# Patient Record
Sex: Female | Born: 1985 | Race: White | Hispanic: No | Marital: Single | Smoking: Current every day smoker
Health system: Southern US, Community
[De-identification: ages and names within clinical notes are randomized; demographics above are authoritative.]

## PROBLEM LIST (undated history)

## (undated) DIAGNOSIS — I82409 Acute embolism and thrombosis of unspecified deep veins of unspecified lower extremity: Secondary | ICD-10-CM

## (undated) DIAGNOSIS — E119 Type 2 diabetes mellitus without complications: Secondary | ICD-10-CM

## (undated) DIAGNOSIS — I2699 Other pulmonary embolism without acute cor pulmonale: Secondary | ICD-10-CM

## (undated) DIAGNOSIS — M359 Systemic involvement of connective tissue, unspecified: Secondary | ICD-10-CM

## (undated) HISTORY — PX: ABDOMINAL HYSTERECTOMY: SHX81

## (undated) HISTORY — PX: OTHER SURGICAL HISTORY: SHX169

---

## 2004-02-03 ENCOUNTER — Emergency Department: Payer: Self-pay | Admitting: Emergency Medicine

## 2004-04-12 ENCOUNTER — Ambulatory Visit: Payer: Self-pay | Admitting: Pain Medicine

## 2004-04-19 ENCOUNTER — Ambulatory Visit: Payer: Self-pay | Admitting: Pain Medicine

## 2004-05-03 ENCOUNTER — Ambulatory Visit: Payer: Self-pay | Admitting: Pain Medicine

## 2004-05-20 ENCOUNTER — Emergency Department: Payer: Self-pay | Admitting: Emergency Medicine

## 2004-06-10 ENCOUNTER — Emergency Department: Payer: Self-pay | Admitting: Unknown Physician Specialty

## 2004-06-12 ENCOUNTER — Emergency Department: Payer: Self-pay | Admitting: Internal Medicine

## 2004-07-03 ENCOUNTER — Emergency Department: Payer: Self-pay | Admitting: Emergency Medicine

## 2004-07-04 ENCOUNTER — Ambulatory Visit: Payer: Self-pay | Admitting: Emergency Medicine

## 2004-08-17 ENCOUNTER — Inpatient Hospital Stay: Payer: Self-pay | Admitting: Obstetrics and Gynecology

## 2004-09-08 ENCOUNTER — Emergency Department: Payer: Self-pay | Admitting: Unknown Physician Specialty

## 2004-12-07 ENCOUNTER — Observation Stay: Payer: Self-pay

## 2005-08-08 ENCOUNTER — Emergency Department: Payer: Self-pay | Admitting: Emergency Medicine

## 2006-07-15 ENCOUNTER — Emergency Department: Payer: Self-pay | Admitting: Emergency Medicine

## 2006-09-23 ENCOUNTER — Emergency Department: Payer: Self-pay | Admitting: Emergency Medicine

## 2007-01-11 ENCOUNTER — Emergency Department: Payer: Self-pay | Admitting: Unknown Physician Specialty

## 2007-01-20 ENCOUNTER — Ambulatory Visit: Payer: Self-pay | Admitting: Internal Medicine

## 2007-12-08 ENCOUNTER — Emergency Department: Payer: Self-pay | Admitting: Emergency Medicine

## 2008-02-18 ENCOUNTER — Emergency Department: Payer: Self-pay | Admitting: Emergency Medicine

## 2008-09-12 ENCOUNTER — Emergency Department: Payer: Self-pay | Admitting: Emergency Medicine

## 2008-10-24 ENCOUNTER — Emergency Department: Payer: Self-pay | Admitting: Emergency Medicine

## 2008-12-13 ENCOUNTER — Ambulatory Visit: Payer: Self-pay | Admitting: Family Medicine

## 2008-12-16 ENCOUNTER — Observation Stay: Payer: Self-pay | Admitting: Surgery

## 2009-01-05 ENCOUNTER — Ambulatory Visit: Payer: Self-pay | Admitting: Gastroenterology

## 2009-03-27 ENCOUNTER — Emergency Department: Payer: Self-pay | Admitting: Emergency Medicine

## 2009-03-30 ENCOUNTER — Ambulatory Visit: Payer: Self-pay | Admitting: Internal Medicine

## 2009-09-20 ENCOUNTER — Emergency Department: Payer: Self-pay | Admitting: Emergency Medicine

## 2009-09-20 ENCOUNTER — Ambulatory Visit: Payer: Self-pay | Admitting: Family Medicine

## 2009-12-30 ENCOUNTER — Emergency Department: Payer: Self-pay | Admitting: Emergency Medicine

## 2010-01-26 ENCOUNTER — Emergency Department: Payer: Self-pay | Admitting: Emergency Medicine

## 2010-02-21 ENCOUNTER — Ambulatory Visit: Payer: Self-pay | Admitting: Obstetrics and Gynecology

## 2010-04-23 ENCOUNTER — Emergency Department: Payer: Self-pay | Admitting: Emergency Medicine

## 2010-05-17 ENCOUNTER — Emergency Department: Payer: Self-pay | Admitting: Emergency Medicine

## 2010-05-18 ENCOUNTER — Ambulatory Visit: Payer: Self-pay | Admitting: Internal Medicine

## 2010-07-30 ENCOUNTER — Emergency Department: Payer: Self-pay | Admitting: Emergency Medicine

## 2010-09-24 ENCOUNTER — Emergency Department: Payer: Self-pay | Admitting: Emergency Medicine

## 2010-12-29 ENCOUNTER — Emergency Department: Payer: Self-pay | Admitting: Unknown Physician Specialty

## 2011-04-13 ENCOUNTER — Emergency Department: Payer: Self-pay | Admitting: Emergency Medicine

## 2011-04-13 LAB — COMPREHENSIVE METABOLIC PANEL
Albumin: 3.8 g/dL (ref 3.4–5.0)
Alkaline Phosphatase: 62 U/L (ref 50–136)
Anion Gap: 9 (ref 7–16)
BUN: 9 mg/dL (ref 7–18)
Bilirubin,Total: 0.2 mg/dL (ref 0.2–1.0)
Calcium, Total: 8.9 mg/dL (ref 8.5–10.1)
Creatinine: 0.75 mg/dL (ref 0.60–1.30)
Glucose: 78 mg/dL (ref 65–99)
Osmolality: 281 (ref 275–301)
Potassium: 3.7 mmol/L (ref 3.5–5.1)
SGPT (ALT): 31 U/L
Sodium: 142 mmol/L (ref 136–145)
Total Protein: 7.6 g/dL (ref 6.4–8.2)

## 2011-04-13 LAB — CBC
MCV: 90 fL (ref 80–100)
Platelet: 214 10*3/uL (ref 150–440)
RBC: 4.87 10*6/uL (ref 3.80–5.20)
WBC: 10.9 10*3/uL (ref 3.6–11.0)

## 2011-04-13 LAB — URINALYSIS, COMPLETE
Bacteria: NONE SEEN
Bilirubin,UR: NEGATIVE
Ketone: NEGATIVE
Leukocyte Esterase: NEGATIVE
Ph: 7 (ref 4.5–8.0)
Protein: NEGATIVE
WBC UR: NONE SEEN /HPF (ref 0–5)

## 2011-04-13 LAB — LIPASE, BLOOD: Lipase: 122 U/L (ref 73–393)

## 2011-06-14 ENCOUNTER — Emergency Department: Payer: Self-pay | Admitting: Emergency Medicine

## 2011-06-15 ENCOUNTER — Emergency Department: Payer: Self-pay | Admitting: Emergency Medicine

## 2011-06-15 LAB — CBC
HGB: 14.5 g/dL (ref 12.0–16.0)
MCH: 30.2 pg (ref 26.0–34.0)
MCV: 90 fL (ref 80–100)
Platelet: 220 10*3/uL (ref 150–440)
RBC: 4.8 10*6/uL (ref 3.80–5.20)

## 2011-06-15 LAB — URINALYSIS, COMPLETE
Bacteria: NONE SEEN
Bilirubin,UR: NEGATIVE
Glucose,UR: NEGATIVE mg/dL (ref 0–75)
Ketone: NEGATIVE
Nitrite: NEGATIVE
Ph: 6 (ref 4.5–8.0)
Squamous Epithelial: 4

## 2011-06-15 LAB — BASIC METABOLIC PANEL
Chloride: 106 mmol/L (ref 98–107)
Co2: 27 mmol/L (ref 21–32)
Creatinine: 0.75 mg/dL (ref 0.60–1.30)
EGFR (African American): >60
Osmolality: 278 (ref 275–301)
Potassium: 3.8 mmol/L (ref 3.5–5.1)
Sodium: 140 mmol/L (ref 136–145)

## 2011-06-17 LAB — URINE CULTURE

## 2011-08-02 ENCOUNTER — Emergency Department: Payer: Self-pay | Admitting: Emergency Medicine

## 2011-08-02 LAB — BASIC METABOLIC PANEL
Chloride: 110 mmol/L — ABNORMAL HIGH (ref 98–107)
Co2: 26 mmol/L (ref 21–32)
Creatinine: 0.64 mg/dL (ref 0.60–1.30)
Glucose: 85 mg/dL (ref 65–99)
Potassium: 3.8 mmol/L (ref 3.5–5.1)

## 2011-08-02 LAB — URINALYSIS, COMPLETE
Glucose,UR: NEGATIVE mg/dL (ref 0–75)
Nitrite: NEGATIVE
Ph: 6 (ref 4.5–8.0)
Protein: NEGATIVE
Squamous Epithelial: 8
WBC UR: 4 /HPF (ref 0–5)

## 2011-08-02 LAB — CBC
MCH: 30.7 pg (ref 26.0–34.0)
MCV: 90 fL (ref 80–100)
Platelet: 229 10*3/uL (ref 150–440)
RBC: 4.61 10*6/uL (ref 3.80–5.20)

## 2011-08-24 ENCOUNTER — Ambulatory Visit: Payer: Self-pay | Admitting: Family Medicine

## 2011-08-28 ENCOUNTER — Emergency Department: Payer: Self-pay | Admitting: Unknown Physician Specialty

## 2011-08-28 LAB — URINALYSIS, COMPLETE
Glucose,UR: NEGATIVE mg/dL (ref 0–75)
Ketone: NEGATIVE
Nitrite: NEGATIVE
Ph: 6 (ref 4.5–8.0)
Protein: NEGATIVE
RBC,UR: 6 /HPF (ref 0–5)

## 2011-08-28 LAB — CBC
HGB: 13.6 g/dL (ref 12.0–16.0)
MCHC: 33.6 g/dL (ref 32.0–36.0)
MCV: 90 fL (ref 80–100)
Platelet: 215 10*3/uL (ref 150–440)
RBC: 4.52 10*6/uL (ref 3.80–5.20)
RDW: 13.2 % (ref 11.5–14.5)
WBC: 12.6 10*3/uL — ABNORMAL HIGH (ref 3.6–11.0)

## 2011-08-28 LAB — COMPREHENSIVE METABOLIC PANEL
Albumin: 3.2 g/dL — ABNORMAL LOW (ref 3.4–5.0)
Alkaline Phosphatase: 76 U/L (ref 50–136)
Anion Gap: 8 (ref 7–16)
BUN: 11 mg/dL (ref 7–18)
Bilirubin,Total: 0.2 mg/dL (ref 0.2–1.0)
Calcium, Total: 8.7 mg/dL (ref 8.5–10.1)
Co2: 26 mmol/L (ref 21–32)
EGFR (African American): >60
EGFR (Non-African Amer.): >60
Glucose: 77 mg/dL (ref 65–99)
SGOT(AST): 17 U/L (ref 15–37)
Sodium: 138 mmol/L (ref 136–145)

## 2011-08-28 LAB — LIPASE, BLOOD: Lipase: 142 U/L (ref 73–393)

## 2011-10-08 ENCOUNTER — Emergency Department: Payer: Self-pay | Admitting: Emergency Medicine

## 2011-10-08 LAB — COMPREHENSIVE METABOLIC PANEL
Albumin: 3.3 g/dL — ABNORMAL LOW (ref 3.4–5.0)
Anion Gap: 6 — ABNORMAL LOW (ref 7–16)
BUN: 6 mg/dL — ABNORMAL LOW (ref 7–18)
Calcium, Total: 8.5 mg/dL (ref 8.5–10.1)
Chloride: 106 mmol/L (ref 98–107)
Co2: 26 mmol/L (ref 21–32)
EGFR (African American): >60
Glucose: 91 mg/dL (ref 65–99)
Osmolality: 273 (ref 275–301)
Potassium: 4 mmol/L (ref 3.5–5.1)
SGOT(AST): 83 U/L — ABNORMAL HIGH (ref 15–37)
SGPT (ALT): 199 U/L — ABNORMAL HIGH

## 2011-10-08 LAB — CBC
HGB: 14.1 g/dL (ref 12.0–16.0)
MCH: 29.9 pg (ref 26.0–34.0)
MCHC: 32.8 g/dL (ref 32.0–36.0)
MCV: 91 fL (ref 80–100)
Platelet: 230 10*3/uL (ref 150–440)
RBC: 4.73 10*6/uL (ref 3.80–5.20)

## 2011-10-08 LAB — URINALYSIS, COMPLETE
Ketone: NEGATIVE
Nitrite: NEGATIVE
Ph: 6 (ref 4.5–8.0)
Protein: NEGATIVE
Specific Gravity: 1.015 (ref 1.003–1.030)
Squamous Epithelial: 4
WBC UR: 5 /HPF (ref 0–5)

## 2011-10-24 ENCOUNTER — Emergency Department: Payer: Self-pay | Admitting: *Deleted

## 2011-10-24 LAB — URINALYSIS, COMPLETE
Bilirubin,UR: NEGATIVE
Glucose,UR: NEGATIVE mg/dL (ref 0–75)
Ph: 6 (ref 4.5–8.0)
Protein: 100
RBC,UR: 646 /HPF (ref 0–5)
Specific Gravity: 1.031 (ref 1.003–1.030)
Squamous Epithelial: 2
WBC UR: 32 /HPF (ref 0–5)

## 2011-10-24 LAB — PREGNANCY, URINE: Pregnancy Test, Urine: NEGATIVE m[IU]/mL

## 2012-03-01 ENCOUNTER — Emergency Department: Payer: Self-pay | Admitting: Emergency Medicine

## 2012-03-16 ENCOUNTER — Emergency Department: Payer: Self-pay | Admitting: Emergency Medicine

## 2012-05-12 ENCOUNTER — Emergency Department: Payer: Self-pay | Admitting: Emergency Medicine

## 2012-06-21 ENCOUNTER — Emergency Department: Payer: Self-pay | Admitting: Unknown Physician Specialty

## 2012-06-21 LAB — COMPREHENSIVE METABOLIC PANEL
Albumin: 3.5 g/dL (ref 3.4–5.0)
Alkaline Phosphatase: 67 U/L (ref 50–136)
Anion Gap: 4 — ABNORMAL LOW (ref 7–16)
Chloride: 109 mmol/L — ABNORMAL HIGH (ref 98–107)
Co2: 28 mmol/L (ref 21–32)
EGFR (African American): >60
EGFR (Non-African Amer.): >60
Glucose: 93 mg/dL (ref 65–99)
Osmolality: 280 (ref 275–301)
Sodium: 141 mmol/L (ref 136–145)
Total Protein: 7.3 g/dL (ref 6.4–8.2)

## 2012-06-21 LAB — CBC
HGB: 14.7 g/dL (ref 12.0–16.0)
MCV: 90 fL (ref 80–100)
Platelet: 221 10*3/uL (ref 150–440)
RBC: 4.91 10*6/uL (ref 3.80–5.20)

## 2012-06-21 LAB — PROTIME-INR
INR: 1
Prothrombin Time: 13.1 secs (ref 11.5–14.7)

## 2012-06-21 LAB — APTT: Activated PTT: 36.1 secs — ABNORMAL HIGH (ref 23.6–35.9)

## 2012-10-04 ENCOUNTER — Emergency Department: Payer: Self-pay | Admitting: Emergency Medicine

## 2012-10-04 LAB — URINALYSIS, COMPLETE
Bilirubin,UR: NEGATIVE
RBC,UR: 406 /HPF (ref 0–5)
Squamous Epithelial: 18

## 2012-11-30 ENCOUNTER — Emergency Department: Payer: Self-pay | Admitting: Emergency Medicine

## 2012-11-30 LAB — URINALYSIS, COMPLETE
Blood: NEGATIVE
Glucose,UR: NEGATIVE mg/dL (ref 0–75)
Ketone: NEGATIVE
RBC,UR: 3 /HPF (ref 0–5)

## 2012-11-30 LAB — CBC
MCH: 30.3 pg (ref 26.0–34.0)
Platelet: 205 10*3/uL (ref 150–440)
RDW: 13.2 % (ref 11.5–14.5)

## 2012-11-30 LAB — COMPREHENSIVE METABOLIC PANEL
Albumin: 3.4 g/dL (ref 3.4–5.0)
Alkaline Phosphatase: 73 U/L (ref 50–136)
BUN: 8 mg/dL (ref 7–18)
Bilirubin,Total: 0.3 mg/dL (ref 0.2–1.0)
Chloride: 109 mmol/L — ABNORMAL HIGH (ref 98–107)
Creatinine: 0.75 mg/dL (ref 0.60–1.30)
EGFR (Non-African Amer.): >60
Glucose: 131 mg/dL — ABNORMAL HIGH (ref 65–99)
Osmolality: 276 (ref 275–301)
SGOT(AST): 21 U/L (ref 15–37)
SGPT (ALT): 28 U/L (ref 12–78)
Sodium: 138 mmol/L (ref 136–145)
Total Protein: 7.1 g/dL (ref 6.4–8.2)

## 2012-12-31 ENCOUNTER — Emergency Department: Payer: Self-pay | Admitting: Emergency Medicine

## 2012-12-31 LAB — URINALYSIS, COMPLETE
Bilirubin,UR: NEGATIVE
Glucose,UR: 150 mg/dL (ref 0–75)
Leukocyte Esterase: NEGATIVE
Ph: 6 (ref 4.5–8.0)
Protein: NEGATIVE
Specific Gravity: 1.024 (ref 1.003–1.030)
Squamous Epithelial: 3

## 2012-12-31 LAB — CBC
MCH: 31.2 pg (ref 26.0–34.0)
MCHC: 34.5 g/dL (ref 32.0–36.0)
Platelet: 239 10*3/uL (ref 150–440)
WBC: 8.7 10*3/uL (ref 3.6–11.0)

## 2012-12-31 LAB — COMPREHENSIVE METABOLIC PANEL
Albumin: 3.3 g/dL — ABNORMAL LOW (ref 3.4–5.0)
Alkaline Phosphatase: 81 U/L (ref 50–136)
Bilirubin,Total: 0.2 mg/dL (ref 0.2–1.0)
Calcium, Total: 8.3 mg/dL — ABNORMAL LOW (ref 8.5–10.1)
Chloride: 107 mmol/L (ref 98–107)
EGFR (Non-African Amer.): >60
Glucose: 198 mg/dL — ABNORMAL HIGH (ref 65–99)
Potassium: 3.8 mmol/L (ref 3.5–5.1)
SGOT(AST): 22 U/L (ref 15–37)
SGPT (ALT): 30 U/L (ref 12–78)

## 2013-01-01 LAB — GC/CHLAMYDIA PROBE AMP

## 2013-01-01 LAB — WET PREP, GENITAL

## 2013-01-22 ENCOUNTER — Emergency Department: Payer: Self-pay | Admitting: Emergency Medicine

## 2013-01-22 LAB — URINALYSIS, COMPLETE
Bilirubin,UR: NEGATIVE
Ketone: NEGATIVE
Leukocyte Esterase: NEGATIVE
Ph: 6 (ref 4.5–8.0)
Protein: NEGATIVE
RBC,UR: 166 /HPF (ref 0–5)
Specific Gravity: 1.022 (ref 1.003–1.030)
WBC UR: 2 /HPF (ref 0–5)

## 2013-01-22 LAB — COMPREHENSIVE METABOLIC PANEL
Albumin: 3.3 g/dL — ABNORMAL LOW (ref 3.4–5.0)
BUN: 10 mg/dL (ref 7–18)
Calcium, Total: 8.2 mg/dL — ABNORMAL LOW (ref 8.5–10.1)
Co2: 25 mmol/L (ref 21–32)
Creatinine: 0.83 mg/dL (ref 0.60–1.30)
EGFR (African American): >60
EGFR (Non-African Amer.): >60
Glucose: 217 mg/dL — ABNORMAL HIGH (ref 65–99)
Osmolality: 285 (ref 275–301)
Sodium: 140 mmol/L (ref 136–145)

## 2013-01-22 LAB — CBC
HCT: 40.1 % (ref 35.0–47.0)
MCHC: 34.5 g/dL (ref 32.0–36.0)
Platelet: 211 10*3/uL (ref 150–440)
RDW: 13.1 % (ref 11.5–14.5)

## 2013-03-29 DIAGNOSIS — Z8672 Personal history of thrombophlebitis: Secondary | ICD-10-CM | POA: Insufficient documentation

## 2013-04-13 ENCOUNTER — Other Ambulatory Visit: Payer: Self-pay | Admitting: Obstetrics and Gynecology

## 2013-04-13 LAB — APTT: Activated PTT: 32.2 secs (ref 23.6–35.9)

## 2013-04-13 LAB — CBC WITH DIFFERENTIAL/PLATELET
BASOS PCT: 1.2 %
Basophil #: 0.1 10*3/uL (ref 0.0–0.1)
EOS ABS: 0.1 10*3/uL (ref 0.0–0.7)
EOS PCT: 0.8 %
HCT: 42.5 % (ref 35.0–47.0)
HGB: 13.9 g/dL (ref 12.0–16.0)
LYMPHS ABS: 2.5 10*3/uL (ref 1.0–3.6)
Lymphocyte %: 26 %
MCH: 29.3 pg (ref 26.0–34.0)
MCHC: 32.7 g/dL (ref 32.0–36.0)
MCV: 90 fL (ref 80–100)
MONO ABS: 0.3 x10 3/mm (ref 0.2–0.9)
Monocyte %: 3 %
NEUTROS PCT: 69 %
Neutrophil #: 6.7 10*3/uL — ABNORMAL HIGH (ref 1.4–6.5)
PLATELETS: 242 10*3/uL (ref 150–440)
RBC: 4.74 10*6/uL (ref 3.80–5.20)
RDW: 13.8 % (ref 11.5–14.5)
WBC: 9.7 10*3/uL (ref 3.6–11.0)

## 2013-04-13 LAB — TSH: Thyroid Stimulating Horm: 0.475 u[IU]/mL

## 2013-04-13 LAB — PROTIME-INR
INR: 1
PROTHROMBIN TIME: 13.2 s (ref 11.5–14.7)

## 2013-05-11 ENCOUNTER — Emergency Department: Payer: Self-pay | Admitting: Emergency Medicine

## 2013-05-13 ENCOUNTER — Ambulatory Visit: Payer: Self-pay | Admitting: Oncology

## 2013-05-16 ENCOUNTER — Emergency Department: Payer: Self-pay | Admitting: Emergency Medicine

## 2013-05-17 ENCOUNTER — Ambulatory Visit: Payer: Self-pay | Admitting: Oncology

## 2013-05-20 ENCOUNTER — Ambulatory Visit: Payer: Self-pay | Admitting: Oncology

## 2013-06-17 ENCOUNTER — Ambulatory Visit: Payer: Self-pay | Admitting: Oncology

## 2013-07-02 ENCOUNTER — Ambulatory Visit: Payer: Self-pay | Admitting: Obstetrics and Gynecology

## 2013-07-02 LAB — CBC
HCT: 40.1 % (ref 35.0–47.0)
HGB: 13 g/dL (ref 12.0–16.0)
MCH: 29 pg (ref 26.0–34.0)
MCHC: 32.5 g/dL (ref 32.0–36.0)
MCV: 89 fL (ref 80–100)
PLATELETS: 215 10*3/uL (ref 150–440)
RBC: 4.5 10*6/uL (ref 3.80–5.20)
RDW: 13.8 % (ref 11.5–14.5)
WBC: 6.6 10*3/uL (ref 3.6–11.0)

## 2013-07-02 LAB — BASIC METABOLIC PANEL
Anion Gap: 6 — ABNORMAL LOW (ref 7–16)
BUN: 8 mg/dL (ref 7–18)
Calcium, Total: 8.2 mg/dL — ABNORMAL LOW (ref 8.5–10.1)
Chloride: 106 mmol/L (ref 98–107)
Co2: 25 mmol/L (ref 21–32)
Creatinine: 0.78 mg/dL (ref 0.60–1.30)
EGFR (African American): >60
EGFR (Non-African Amer.): >60
GLUCOSE: 219 mg/dL — AB (ref 65–99)
Osmolality: 279 (ref 275–301)
POTASSIUM: 3.4 mmol/L — AB (ref 3.5–5.1)
SODIUM: 137 mmol/L (ref 136–145)

## 2013-07-14 ENCOUNTER — Ambulatory Visit: Payer: Self-pay | Admitting: Obstetrics and Gynecology

## 2013-07-15 LAB — BASIC METABOLIC PANEL
Anion Gap: 9 (ref 7–16)
BUN: 8 mg/dL (ref 7–18)
Calcium, Total: 8.4 mg/dL — ABNORMAL LOW (ref 8.5–10.1)
Chloride: 107 mmol/L (ref 98–107)
Co2: 24 mmol/L (ref 21–32)
Creatinine: 0.71 mg/dL (ref 0.60–1.30)
EGFR (African American): >60
Glucose: 182 mg/dL — ABNORMAL HIGH (ref 65–99)
OSMOLALITY: 282 (ref 275–301)
Potassium: 4.2 mmol/L (ref 3.5–5.1)
Sodium: 140 mmol/L (ref 136–145)

## 2013-07-15 LAB — CBC WITH DIFFERENTIAL/PLATELET
Basophil #: 0.1 10*3/uL (ref 0.0–0.1)
Basophil %: 1.1 %
EOS PCT: 0 %
Eosinophil #: 0 10*3/uL (ref 0.0–0.7)
HCT: 38.6 % (ref 35.0–47.0)
HGB: 12.4 g/dL (ref 12.0–16.0)
Lymphocyte #: 0.9 10*3/uL — ABNORMAL LOW (ref 1.0–3.6)
Lymphocyte %: 6.8 %
MCH: 28.7 pg (ref 26.0–34.0)
MCHC: 32.2 g/dL (ref 32.0–36.0)
MCV: 89 fL (ref 80–100)
MONO ABS: 0.4 x10 3/mm (ref 0.2–0.9)
Monocyte %: 3 %
NEUTROS ABS: 11.8 10*3/uL — AB (ref 1.4–6.5)
Neutrophil %: 89.1 %
PLATELETS: 213 10*3/uL (ref 150–440)
RBC: 4.33 10*6/uL (ref 3.80–5.20)
RDW: 13.5 % (ref 11.5–14.5)
WBC: 13.3 10*3/uL — AB (ref 3.6–11.0)

## 2013-07-17 ENCOUNTER — Ambulatory Visit: Payer: Self-pay | Admitting: Internal Medicine

## 2013-07-17 LAB — PATHOLOGY REPORT

## 2013-07-18 ENCOUNTER — Emergency Department: Payer: Self-pay | Admitting: Internal Medicine

## 2013-07-18 LAB — COMPREHENSIVE METABOLIC PANEL
Albumin: 3.4 g/dL (ref 3.4–5.0)
Alkaline Phosphatase: 61 U/L
Anion Gap: 9 (ref 7–16)
BUN: 7 mg/dL (ref 7–18)
Bilirubin,Total: 0.4 mg/dL (ref 0.2–1.0)
CO2: 23 mmol/L (ref 21–32)
CREATININE: 0.74 mg/dL (ref 0.60–1.30)
Calcium, Total: 8.9 mg/dL (ref 8.5–10.1)
Chloride: 105 mmol/L (ref 98–107)
EGFR (African American): >60
EGFR (Non-African Amer.): >60
Glucose: 177 mg/dL — ABNORMAL HIGH (ref 65–99)
OSMOLALITY: 276 (ref 275–301)
Potassium: 3.9 mmol/L (ref 3.5–5.1)
SGOT(AST): 23 U/L (ref 15–37)
SGPT (ALT): 19 U/L (ref 12–78)
SODIUM: 137 mmol/L (ref 136–145)
TOTAL PROTEIN: 7.4 g/dL (ref 6.4–8.2)

## 2013-07-18 LAB — CBC WITH DIFFERENTIAL/PLATELET
Basophil #: 0.2 10*3/uL — ABNORMAL HIGH (ref 0.0–0.1)
Basophil %: 2.1 %
EOS ABS: 0.1 10*3/uL (ref 0.0–0.7)
Eosinophil %: 1 %
HCT: 40.7 % (ref 35.0–47.0)
HGB: 13.8 g/dL (ref 12.0–16.0)
Lymphocyte #: 2.1 10*3/uL (ref 1.0–3.6)
Lymphocyte %: 20.1 %
MCH: 29.9 pg (ref 26.0–34.0)
MCHC: 33.8 g/dL (ref 32.0–36.0)
MCV: 89 fL (ref 80–100)
MONO ABS: 0.3 x10 3/mm (ref 0.2–0.9)
Monocyte %: 2.6 %
NEUTROS ABS: 7.8 10*3/uL — AB (ref 1.4–6.5)
Neutrophil %: 74.2 %
PLATELETS: 254 10*3/uL (ref 150–440)
RBC: 4.6 10*6/uL (ref 3.80–5.20)
RDW: 13.7 % (ref 11.5–14.5)
WBC: 10.4 10*3/uL (ref 3.6–11.0)

## 2013-07-18 LAB — URINALYSIS, COMPLETE
Bacteria: NONE SEEN
Bilirubin,UR: NEGATIVE
Glucose,UR: NEGATIVE mg/dL (ref 0–75)
Ketone: NEGATIVE
NITRITE: NEGATIVE
Ph: 8 (ref 4.5–8.0)
Protein: NEGATIVE
Specific Gravity: 1.01 (ref 1.003–1.030)
Squamous Epithelial: 18
WBC UR: 6 /HPF (ref 0–5)

## 2013-07-18 LAB — LIPASE, BLOOD: Lipase: 89 U/L (ref 73–393)

## 2013-08-08 ENCOUNTER — Inpatient Hospital Stay: Payer: Self-pay | Admitting: Family Medicine

## 2013-08-08 LAB — COMPREHENSIVE METABOLIC PANEL
ALK PHOS: 62 U/L
ALT: 20 U/L (ref 12–78)
Albumin: 3.5 g/dL (ref 3.4–5.0)
Anion Gap: 7 (ref 7–16)
BILIRUBIN TOTAL: 0.2 mg/dL (ref 0.2–1.0)
BUN: 9 mg/dL (ref 7–18)
Calcium, Total: 8.7 mg/dL (ref 8.5–10.1)
Chloride: 106 mmol/L (ref 98–107)
Co2: 25 mmol/L (ref 21–32)
Creatinine: 0.73 mg/dL (ref 0.60–1.30)
EGFR (Non-African Amer.): >60
Glucose: 124 mg/dL — ABNORMAL HIGH (ref 65–99)
Osmolality: 276 (ref 275–301)
Potassium: 3.4 mmol/L — ABNORMAL LOW (ref 3.5–5.1)
SGOT(AST): 18 U/L (ref 15–37)
SODIUM: 138 mmol/L (ref 136–145)
Total Protein: 7.3 g/dL (ref 6.4–8.2)

## 2013-08-08 LAB — PRO B NATRIURETIC PEPTIDE: B-Type Natriuretic Peptide: 40 pg/mL (ref 0–125)

## 2013-08-08 LAB — CK TOTAL AND CKMB (NOT AT ARMC)
CK, Total: 77 U/L
CK-MB: <0.5 ng/mL — ABNORMAL LOW (ref 0.5–3.6)

## 2013-08-08 LAB — CBC
HCT: 39.5 % (ref 35.0–47.0)
HGB: 13.2 g/dL (ref 12.0–16.0)
MCH: 29 pg (ref 26.0–34.0)
MCHC: 33.3 g/dL (ref 32.0–36.0)
MCV: 87 fL (ref 80–100)
Platelet: 225 10*3/uL (ref 150–440)
RBC: 4.53 10*6/uL (ref 3.80–5.20)
RDW: 13.7 % (ref 11.5–14.5)
WBC: 9.7 10*3/uL (ref 3.6–11.0)

## 2013-08-08 LAB — PROTIME-INR
INR: 0.9
PROTHROMBIN TIME: 12.5 s (ref 11.5–14.7)

## 2013-08-08 LAB — TROPONIN I

## 2013-08-08 LAB — APTT: ACTIVATED PTT: 33.3 s (ref 23.6–35.9)

## 2013-08-09 LAB — APTT
ACTIVATED PTT: 66.5 s — AB (ref 23.6–35.9)
Activated PTT: 51.5 secs — ABNORMAL HIGH (ref 23.6–35.9)
Activated PTT: 50.2 secs — ABNORMAL HIGH (ref 23.6–35.9)

## 2013-08-10 LAB — HEMOGLOBIN: HGB: 12.7 g/dL (ref 12.0–16.0)

## 2013-08-10 LAB — APTT
ACTIVATED PTT: 61 s — AB (ref 23.6–35.9)
Activated PTT: 72.9 secs — ABNORMAL HIGH (ref 23.6–35.9)

## 2013-08-10 LAB — PLATELET COUNT: PLATELETS: 194 10*3/uL (ref 150–440)

## 2013-08-17 ENCOUNTER — Emergency Department: Payer: Self-pay | Admitting: Emergency Medicine

## 2013-08-17 ENCOUNTER — Ambulatory Visit: Payer: Self-pay | Admitting: Internal Medicine

## 2013-08-17 LAB — BASIC METABOLIC PANEL
Anion Gap: 10 (ref 7–16)
BUN: 8 mg/dL (ref 7–18)
CHLORIDE: 104 mmol/L (ref 98–107)
Calcium, Total: 8.4 mg/dL — ABNORMAL LOW (ref 8.5–10.1)
Co2: 23 mmol/L (ref 21–32)
Creatinine: 0.64 mg/dL (ref 0.60–1.30)
EGFR (African American): >60
EGFR (Non-African Amer.): >60
GLUCOSE: 156 mg/dL — AB (ref 65–99)
OSMOLALITY: 275 (ref 275–301)
Potassium: 3.7 mmol/L (ref 3.5–5.1)
SODIUM: 137 mmol/L (ref 136–145)

## 2013-08-17 LAB — CBC
HCT: 39.6 % (ref 35.0–47.0)
HGB: 13 g/dL (ref 12.0–16.0)
MCH: 28.7 pg (ref 26.0–34.0)
MCHC: 32.8 g/dL (ref 32.0–36.0)
MCV: 87 fL (ref 80–100)
PLATELETS: 206 10*3/uL (ref 150–440)
RBC: 4.52 10*6/uL (ref 3.80–5.20)
RDW: 14 % (ref 11.5–14.5)
WBC: 8.3 10*3/uL (ref 3.6–11.0)

## 2013-08-17 LAB — TROPONIN I

## 2013-08-19 ENCOUNTER — Ambulatory Visit: Payer: Self-pay | Admitting: Oncology

## 2013-08-26 ENCOUNTER — Ambulatory Visit: Payer: Self-pay | Admitting: Oncology

## 2013-09-05 ENCOUNTER — Emergency Department: Payer: Self-pay | Admitting: Emergency Medicine

## 2013-09-05 LAB — COMPREHENSIVE METABOLIC PANEL
ALK PHOS: 58 U/L
Albumin: 3.4 g/dL (ref 3.4–5.0)
Anion Gap: 5 — ABNORMAL LOW (ref 7–16)
BUN: 9 mg/dL (ref 7–18)
Bilirubin,Total: 0.2 mg/dL (ref 0.2–1.0)
CALCIUM: 8.9 mg/dL (ref 8.5–10.1)
CHLORIDE: 107 mmol/L (ref 98–107)
Co2: 25 mmol/L (ref 21–32)
Creatinine: 0.84 mg/dL (ref 0.60–1.30)
EGFR (African American): >60
EGFR (Non-African Amer.): >60
GLUCOSE: 183 mg/dL — AB (ref 65–99)
Osmolality: 277 (ref 275–301)
POTASSIUM: 3.9 mmol/L (ref 3.5–5.1)
SGOT(AST): 17 U/L (ref 15–37)
SGPT (ALT): 27 U/L (ref 12–78)
Sodium: 137 mmol/L (ref 136–145)
TOTAL PROTEIN: 7.6 g/dL (ref 6.4–8.2)

## 2013-09-05 LAB — CBC
HCT: 44.4 % (ref 35.0–47.0)
HGB: 14 g/dL (ref 12.0–16.0)
MCH: 28.1 pg (ref 26.0–34.0)
MCHC: 31.5 g/dL — ABNORMAL LOW (ref 32.0–36.0)
MCV: 89 fL (ref 80–100)
Platelet: 209 10*3/uL (ref 150–440)
RBC: 4.98 10*6/uL (ref 3.80–5.20)
RDW: 14 % (ref 11.5–14.5)
WBC: 7.9 10*3/uL (ref 3.6–11.0)

## 2013-09-05 LAB — PROTIME-INR
INR: 1
Prothrombin Time: 13.5 secs (ref 11.5–14.7)

## 2013-09-05 LAB — TROPONIN I: Troponin-I: <0.02 ng/mL

## 2013-09-05 LAB — APTT: ACTIVATED PTT: 35.2 s (ref 23.6–35.9)

## 2013-09-16 ENCOUNTER — Ambulatory Visit: Payer: Self-pay | Admitting: Internal Medicine

## 2013-09-16 ENCOUNTER — Ambulatory Visit: Payer: Self-pay | Admitting: Oncology

## 2013-09-20 ENCOUNTER — Emergency Department: Payer: Self-pay | Admitting: Emergency Medicine

## 2013-09-20 LAB — COMPREHENSIVE METABOLIC PANEL
ALBUMIN: 3.3 g/dL — AB (ref 3.4–5.0)
ALT: 26 U/L (ref 12–78)
AST: 18 U/L (ref 15–37)
Alkaline Phosphatase: 64 U/L
Anion Gap: 9 (ref 7–16)
BILIRUBIN TOTAL: 0.2 mg/dL (ref 0.2–1.0)
BUN: 9 mg/dL (ref 7–18)
CHLORIDE: 107 mmol/L (ref 98–107)
Calcium, Total: 8.4 mg/dL — ABNORMAL LOW (ref 8.5–10.1)
Co2: 24 mmol/L (ref 21–32)
Creatinine: 0.84 mg/dL (ref 0.60–1.30)
EGFR (Non-African Amer.): >60
GLUCOSE: 137 mg/dL — AB (ref 65–99)
Osmolality: 280 (ref 275–301)
Potassium: 3.4 mmol/L — ABNORMAL LOW (ref 3.5–5.1)
Sodium: 140 mmol/L (ref 136–145)
Total Protein: 7.5 g/dL (ref 6.4–8.2)

## 2013-09-20 LAB — CBC WITH DIFFERENTIAL/PLATELET
BASOS ABS: 0.1 10*3/uL (ref 0.0–0.1)
Basophil %: 1.2 %
EOS ABS: 0.1 10*3/uL (ref 0.0–0.7)
Eosinophil %: 1.4 %
HCT: 40.1 % (ref 35.0–47.0)
HGB: 12.9 g/dL (ref 12.0–16.0)
LYMPHS ABS: 3.5 10*3/uL (ref 1.0–3.6)
Lymphocyte %: 34.5 %
MCH: 28.2 pg (ref 26.0–34.0)
MCHC: 32.2 g/dL (ref 32.0–36.0)
MCV: 87 fL (ref 80–100)
MONO ABS: 0.4 x10 3/mm (ref 0.2–0.9)
MONOS PCT: 3.9 %
NEUTROS ABS: 5.9 10*3/uL (ref 1.4–6.5)
Neutrophil %: 59 %
PLATELETS: 261 10*3/uL (ref 150–440)
RBC: 4.59 10*6/uL (ref 3.80–5.20)
RDW: 14.5 % (ref 11.5–14.5)
WBC: 10.1 10*3/uL (ref 3.6–11.0)

## 2013-09-20 LAB — TROPONIN I

## 2013-11-03 ENCOUNTER — Emergency Department: Payer: Self-pay | Admitting: Emergency Medicine

## 2013-11-03 LAB — COMPREHENSIVE METABOLIC PANEL
ALBUMIN: 3.5 g/dL (ref 3.4–5.0)
ALK PHOS: 62 U/L
AST: 13 U/L — AB (ref 15–37)
Anion Gap: 7 (ref 7–16)
BUN: 10 mg/dL (ref 7–18)
Bilirubin,Total: 0.2 mg/dL (ref 0.2–1.0)
CHLORIDE: 108 mmol/L — AB (ref 98–107)
Calcium, Total: 8.2 mg/dL — ABNORMAL LOW (ref 8.5–10.1)
Co2: 24 mmol/L (ref 21–32)
Creatinine: 0.96 mg/dL (ref 0.60–1.30)
EGFR (African American): >60
Glucose: 144 mg/dL — ABNORMAL HIGH (ref 65–99)
Osmolality: 279 (ref 275–301)
POTASSIUM: 3.6 mmol/L (ref 3.5–5.1)
SGPT (ALT): 23 U/L
SODIUM: 139 mmol/L (ref 136–145)
Total Protein: 7.4 g/dL (ref 6.4–8.2)

## 2013-11-03 LAB — URINALYSIS, COMPLETE
BILIRUBIN, UR: NEGATIVE
Bacteria: NONE SEEN
GLUCOSE, UR: NEGATIVE mg/dL (ref 0–75)
Ketone: NEGATIVE
Leukocyte Esterase: NEGATIVE
NITRITE: NEGATIVE
PROTEIN: NEGATIVE
Ph: 6 (ref 4.5–8.0)
RBC,UR: 373 /HPF (ref 0–5)
SPECIFIC GRAVITY: 1.021 (ref 1.003–1.030)

## 2013-11-03 LAB — CBC
HCT: 41.7 % (ref 35.0–47.0)
HGB: 13.4 g/dL (ref 12.0–16.0)
MCH: 28.1 pg (ref 26.0–34.0)
MCHC: 32.1 g/dL (ref 32.0–36.0)
MCV: 88 fL (ref 80–100)
PLATELETS: 232 10*3/uL (ref 150–440)
RBC: 4.77 10*6/uL (ref 3.80–5.20)
RDW: 14.6 % — ABNORMAL HIGH (ref 11.5–14.5)
WBC: 8.6 10*3/uL (ref 3.6–11.0)

## 2013-12-01 DIAGNOSIS — Z86718 Personal history of other venous thrombosis and embolism: Secondary | ICD-10-CM | POA: Insufficient documentation

## 2013-12-03 ENCOUNTER — Emergency Department: Payer: Self-pay | Admitting: Emergency Medicine

## 2014-01-10 ENCOUNTER — Inpatient Hospital Stay: Payer: Self-pay | Admitting: Internal Medicine

## 2014-01-10 LAB — COMPREHENSIVE METABOLIC PANEL
ALBUMIN: 3.6 g/dL (ref 3.4–5.0)
ALK PHOS: 63 U/L
ALT: 24 U/L
ANION GAP: 7 (ref 7–16)
BUN: 6 mg/dL — AB (ref 7–18)
Bilirubin,Total: 0.4 mg/dL (ref 0.2–1.0)
CO2: 27 mmol/L (ref 21–32)
CREATININE: 0.74 mg/dL (ref 0.60–1.30)
Calcium, Total: 8.9 mg/dL (ref 8.5–10.1)
Chloride: 106 mmol/L (ref 98–107)
EGFR (African American): >60
GLUCOSE: 104 mg/dL — AB (ref 65–99)
OSMOLALITY: 277 (ref 275–301)
Potassium: 4.1 mmol/L (ref 3.5–5.1)
SGOT(AST): 18 U/L (ref 15–37)
Sodium: 140 mmol/L (ref 136–145)
Total Protein: 7.8 g/dL (ref 6.4–8.2)

## 2014-01-10 LAB — CBC WITH DIFFERENTIAL/PLATELET
BASOS ABS: 0.1 10*3/uL (ref 0.0–0.1)
Basophil %: 0.9 %
EOS ABS: 0 10*3/uL (ref 0.0–0.7)
EOS PCT: 0.3 %
HCT: 43.2 % (ref 35.0–47.0)
HGB: 13.6 g/dL (ref 12.0–16.0)
LYMPHS PCT: 18.1 %
Lymphocyte #: 2 10*3/uL (ref 1.0–3.6)
MCH: 27.4 pg (ref 26.0–34.0)
MCHC: 31.4 g/dL — ABNORMAL LOW (ref 32.0–36.0)
MCV: 87 fL (ref 80–100)
Monocyte #: 0.4 x10 3/mm (ref 0.2–0.9)
Monocyte %: 3.3 %
NEUTROS ABS: 8.4 10*3/uL — AB (ref 1.4–6.5)
Neutrophil %: 77.4 %
Platelet: 254 10*3/uL (ref 150–440)
RBC: 4.96 10*6/uL (ref 3.80–5.20)
RDW: 15.3 % — AB (ref 11.5–14.5)
WBC: 10.9 10*3/uL (ref 3.6–11.0)

## 2014-01-10 LAB — URINALYSIS, COMPLETE
BACTERIA: NONE SEEN
Bilirubin,UR: NEGATIVE
Blood: NEGATIVE
GLUCOSE, UR: NEGATIVE mg/dL (ref 0–75)
Ketone: NEGATIVE
Nitrite: NEGATIVE
PROTEIN: NEGATIVE
Ph: 8 (ref 4.5–8.0)
RBC,UR: 2 /HPF (ref 0–5)
SPECIFIC GRAVITY: 1.012 (ref 1.003–1.030)
Squamous Epithelial: 2
WBC UR: 14 /HPF (ref 0–5)

## 2014-01-10 LAB — HCG, QUANTITATIVE, PREGNANCY

## 2014-01-11 LAB — BASIC METABOLIC PANEL
ANION GAP: 7 (ref 7–16)
BUN: 7 mg/dL (ref 7–18)
CALCIUM: 8.4 mg/dL — AB (ref 8.5–10.1)
CREATININE: 0.76 mg/dL (ref 0.60–1.30)
Chloride: 108 mmol/L — ABNORMAL HIGH (ref 98–107)
Co2: 26 mmol/L (ref 21–32)
EGFR (Non-African Amer.): >60
GLUCOSE: 116 mg/dL — AB (ref 65–99)
Osmolality: 280 (ref 275–301)
Potassium: 3.7 mmol/L (ref 3.5–5.1)
Sodium: 141 mmol/L (ref 136–145)

## 2014-01-11 LAB — MAGNESIUM: MAGNESIUM: 1.9 mg/dL

## 2014-01-12 LAB — URINE CULTURE

## 2014-01-27 ENCOUNTER — Emergency Department: Payer: Self-pay | Admitting: Emergency Medicine

## 2014-01-27 LAB — COMPREHENSIVE METABOLIC PANEL
ALT: 33 U/L
Albumin: 3.3 g/dL — ABNORMAL LOW (ref 3.4–5.0)
Alkaline Phosphatase: 68 U/L
Anion Gap: 7 (ref 7–16)
BILIRUBIN TOTAL: 0.2 mg/dL (ref 0.2–1.0)
BUN: 9 mg/dL (ref 7–18)
CALCIUM: 8.2 mg/dL — AB (ref 8.5–10.1)
CREATININE: 0.78 mg/dL (ref 0.60–1.30)
Chloride: 105 mmol/L (ref 98–107)
Co2: 26 mmol/L (ref 21–32)
EGFR (Non-African Amer.): >60
GLUCOSE: 165 mg/dL — AB (ref 65–99)
OSMOLALITY: 278 (ref 275–301)
POTASSIUM: 4 mmol/L (ref 3.5–5.1)
SGOT(AST): 18 U/L (ref 15–37)
SODIUM: 138 mmol/L (ref 136–145)
TOTAL PROTEIN: 7 g/dL (ref 6.4–8.2)

## 2014-01-27 LAB — URINALYSIS, COMPLETE
BILIRUBIN, UR: NEGATIVE
Glucose,UR: NEGATIVE mg/dL (ref 0–75)
Hyaline Cast: 3
KETONE: NEGATIVE
Leukocyte Esterase: NEGATIVE
Nitrite: NEGATIVE
PH: 5 (ref 4.5–8.0)
Protein: NEGATIVE
RBC,UR: 1047 /HPF (ref 0–5)
SPECIFIC GRAVITY: 1.024 (ref 1.003–1.030)
Squamous Epithelial: 32

## 2014-01-27 LAB — PREGNANCY, URINE: Pregnancy Test, Urine: NEGATIVE m[IU]/mL

## 2014-02-06 ENCOUNTER — Emergency Department: Payer: Self-pay | Admitting: Emergency Medicine

## 2014-02-15 ENCOUNTER — Emergency Department: Payer: Self-pay | Admitting: Emergency Medicine

## 2014-02-21 ENCOUNTER — Emergency Department: Payer: Self-pay | Admitting: Emergency Medicine

## 2014-03-03 LAB — URINALYSIS, COMPLETE
Bilirubin,UR: NEGATIVE
Ketone: NEGATIVE
LEUKOCYTE ESTERASE: NEGATIVE
NITRITE: NEGATIVE
Ph: 5 (ref 4.5–8.0)
RBC,UR: 4 /HPF (ref 0–5)
Specific Gravity: 1.031 (ref 1.003–1.030)
Squamous Epithelial: 11

## 2014-03-03 LAB — BASIC METABOLIC PANEL
ANION GAP: 8 (ref 7–16)
BUN: 11 mg/dL (ref 7–18)
CHLORIDE: 107 mmol/L (ref 98–107)
CO2: 22 mmol/L (ref 21–32)
Calcium, Total: 8.2 mg/dL — ABNORMAL LOW (ref 8.5–10.1)
Creatinine: 0.82 mg/dL (ref 0.60–1.30)
EGFR (African American): >60
EGFR (Non-African Amer.): >60
GLUCOSE: 141 mg/dL — AB (ref 65–99)
OSMOLALITY: 276 (ref 275–301)
POTASSIUM: 3.6 mmol/L (ref 3.5–5.1)
Sodium: 137 mmol/L (ref 136–145)

## 2014-03-03 LAB — CBC
HCT: 42.2 % (ref 35.0–47.0)
HGB: 13.2 g/dL (ref 12.0–16.0)
MCH: 27.4 pg (ref 26.0–34.0)
MCHC: 31.4 g/dL — ABNORMAL LOW (ref 32.0–36.0)
MCV: 88 fL (ref 80–100)
Platelet: 214 10*3/uL (ref 150–440)
RBC: 4.83 10*6/uL (ref 3.80–5.20)
RDW: 15.1 % — AB (ref 11.5–14.5)
WBC: 14.4 10*3/uL — AB (ref 3.6–11.0)

## 2014-03-03 LAB — TROPONIN I

## 2014-03-04 ENCOUNTER — Observation Stay: Payer: Self-pay | Admitting: Internal Medicine

## 2014-03-04 LAB — PROTIME-INR
INR: 1.1
PROTHROMBIN TIME: 13.9 s (ref 11.5–14.7)

## 2014-03-04 LAB — TROPONIN I: Troponin-I: <0.02 ng/mL

## 2014-03-04 LAB — APTT: Activated PTT: 35.9 secs (ref 23.6–35.9)

## 2014-03-05 LAB — PROTIME-INR
INR: 1.1
Prothrombin Time: 14.2 secs (ref 11.5–14.7)

## 2014-03-05 LAB — URINE CULTURE

## 2014-03-09 ENCOUNTER — Emergency Department: Payer: Self-pay | Admitting: Emergency Medicine

## 2014-03-09 LAB — COMPREHENSIVE METABOLIC PANEL
ANION GAP: 5 — AB (ref 7–16)
AST: 27 U/L (ref 15–37)
Albumin: 3.2 g/dL — ABNORMAL LOW (ref 3.4–5.0)
Alkaline Phosphatase: 76 U/L
BILIRUBIN TOTAL: 0.2 mg/dL (ref 0.2–1.0)
BUN: 10 mg/dL (ref 7–18)
CHLORIDE: 108 mmol/L — AB (ref 98–107)
CREATININE: 1.03 mg/dL (ref 0.60–1.30)
Calcium, Total: 8 mg/dL — ABNORMAL LOW (ref 8.5–10.1)
Co2: 27 mmol/L (ref 21–32)
EGFR (Non-African Amer.): >60
GLUCOSE: 158 mg/dL — AB (ref 65–99)
Osmolality: 282 (ref 275–301)
Potassium: 3.5 mmol/L (ref 3.5–5.1)
SGPT (ALT): 85 U/L — ABNORMAL HIGH
Sodium: 140 mmol/L (ref 136–145)
TOTAL PROTEIN: 7.2 g/dL (ref 6.4–8.2)

## 2014-03-09 LAB — PROTIME-INR
INR: 1
Prothrombin Time: 13 secs (ref 11.5–14.7)

## 2014-03-09 LAB — APTT: Activated PTT: 31.9 secs (ref 23.6–35.9)

## 2014-03-09 LAB — CBC WITH DIFFERENTIAL/PLATELET
Basophil #: 0.1 10*3/uL (ref 0.0–0.1)
Basophil %: 0.8 %
EOS ABS: 0.1 10*3/uL (ref 0.0–0.7)
Eosinophil %: 0.6 %
HCT: 39.5 % (ref 35.0–47.0)
HGB: 12.8 g/dL (ref 12.0–16.0)
Lymphocyte #: 2.4 10*3/uL (ref 1.0–3.6)
Lymphocyte %: 25.5 %
MCH: 28 pg (ref 26.0–34.0)
MCHC: 32.3 g/dL (ref 32.0–36.0)
MCV: 87 fL (ref 80–100)
MONO ABS: 0.3 x10 3/mm (ref 0.2–0.9)
MONOS PCT: 2.9 %
Neutrophil #: 6.7 10*3/uL — ABNORMAL HIGH (ref 1.4–6.5)
Neutrophil %: 70.2 %
PLATELETS: 265 10*3/uL (ref 150–440)
RBC: 4.56 10*6/uL (ref 3.80–5.20)
RDW: 15.2 % — ABNORMAL HIGH (ref 11.5–14.5)
WBC: 9.6 10*3/uL (ref 3.6–11.0)

## 2014-03-09 LAB — CK TOTAL AND CKMB (NOT AT ARMC): CK, Total: 72 U/L (ref 26–192)

## 2014-03-09 LAB — LIPASE, BLOOD: LIPASE: 147 U/L (ref 73–393)

## 2014-03-09 LAB — TROPONIN I: Troponin-I: <0.02 ng/mL

## 2014-03-09 LAB — MAGNESIUM: Magnesium: 2.1 mg/dL

## 2014-03-25 DIAGNOSIS — Z720 Tobacco use: Secondary | ICD-10-CM | POA: Insufficient documentation

## 2014-03-25 DIAGNOSIS — K219 Gastro-esophageal reflux disease without esophagitis: Secondary | ICD-10-CM | POA: Diagnosis present

## 2014-03-31 ENCOUNTER — Emergency Department: Payer: Self-pay | Admitting: Emergency Medicine

## 2014-03-31 LAB — COMPREHENSIVE METABOLIC PANEL
ALT: 29 U/L
AST: 20 U/L (ref 15–37)
Albumin: 3.1 g/dL — ABNORMAL LOW (ref 3.4–5.0)
Alkaline Phosphatase: 61 U/L
Anion Gap: 6 — ABNORMAL LOW (ref 7–16)
BILIRUBIN TOTAL: 0.2 mg/dL (ref 0.2–1.0)
BUN: 11 mg/dL (ref 7–18)
CHLORIDE: 108 mmol/L — AB (ref 98–107)
CO2: 28 mmol/L (ref 21–32)
CREATININE: 0.84 mg/dL (ref 0.60–1.30)
Calcium, Total: 8.3 mg/dL — ABNORMAL LOW (ref 8.5–10.1)
EGFR (Non-African Amer.): >60
Glucose: 115 mg/dL — ABNORMAL HIGH (ref 65–99)
Osmolality: 283 (ref 275–301)
Potassium: 3.9 mmol/L (ref 3.5–5.1)
Sodium: 142 mmol/L (ref 136–145)
Total Protein: 6.8 g/dL (ref 6.4–8.2)

## 2014-03-31 LAB — CBC
HCT: 39.4 % (ref 35.0–47.0)
HGB: 12.5 g/dL (ref 12.0–16.0)
MCH: 27.9 pg (ref 26.0–34.0)
MCHC: 31.9 g/dL — AB (ref 32.0–36.0)
MCV: 88 fL (ref 80–100)
PLATELETS: 217 10*3/uL (ref 150–440)
RBC: 4.49 10*6/uL (ref 3.80–5.20)
RDW: 15.3 % — AB (ref 11.5–14.5)
WBC: 7.4 10*3/uL (ref 3.6–11.0)

## 2014-03-31 LAB — PROTIME-INR
INR: 1
PROTHROMBIN TIME: 13.3 s (ref 11.5–14.7)

## 2014-03-31 LAB — TROPONIN I: Troponin-I: <0.02 ng/mL

## 2014-05-05 ENCOUNTER — Emergency Department: Payer: Self-pay | Admitting: Student

## 2014-05-11 ENCOUNTER — Emergency Department: Payer: Self-pay | Admitting: Student

## 2014-06-08 ENCOUNTER — Emergency Department: Payer: Self-pay | Admitting: Internal Medicine

## 2014-06-18 ENCOUNTER — Ambulatory Visit
Admit: 2014-06-18 | Disposition: A | Discharge status: Home / Self Care | Payer: Self-pay | Attending: Oncology | Admitting: Oncology

## 2014-07-06 ENCOUNTER — Emergency Department
Admit: 2014-07-06 | Disposition: A | Discharge status: Home / Self Care | Payer: Self-pay | Admitting: Emergency Medicine

## 2014-07-06 LAB — APTT: ACTIVATED PTT: 29.6 s (ref 23.6–35.9)

## 2014-07-06 LAB — PROTIME-INR
INR: 1
Prothrombin Time: 13.2 secs

## 2014-07-10 NOTE — Consult Note (Signed)
Present Illness The patient 29 year old woman with history of recurrent DVTs. She has previously followed with [facility] as well as Dr. Grayland Ormond in our hospital facility. She is presenting with arm pain, describes 2-day duration of right upper extremity pain located over the medial aspect of the biceps, sharp in quality, 8/10 in intensity, nonradiating, worse with movements as well as direct palpation. No relieving factors, associated swelling and erythema. She noticed a brief episode of shortness of breath 1 day prior to arrival to the Emergency Department and denies any chest pain. Denies any pleuritic symptoms. No fevers, chills, or further symptomatology.  She has been admitted and started on a heparin gtt  She does not have a history of bleeding complications.    Per the patient she has not been told she needs life long anticoagulation.  PAST MEDICAL HISTORY: Depression not otherwise specified, as well as recurrent DVTs, apparently she has had 5 DVTs in the past, has had extensive workup at both [facility] and with Dr. Grayland Ormond. She had previously been on long-term anticoagulation; however, due to financial issues, she has stopped Lovenox about 1 to 2 weeks ago.   Home Medications: Medication Instructions Status  KlonoPIN 1 mg oral tablet 1 tab(s) orally 3 times a day, As Needed Active    PCN: Rash  Xarelto: Bleeding  Ibuprofen: Other  Case History:  Family History Non-Contributory   Social History positive  tobacco, negative ETOH, negative Illicit drugs   Review of Systems:  Fever/Chills No   Cough No   Sputum No   Abdominal Pain No   Diarrhea No   Constipation No   Nausea/Vomiting No   SOB/DOE No   Chest Pain No   Telemetry Reviewed NSR   Physical Exam:  GEN well developed, obese   HEENT hearing intact to voice, moist oral mucosa, good dentition   NECK supple  trachea midline   RESP normal resp effort  no use of accessory muscles   CARD regular  rate  no JVD   ABD denies tenderness  soft   EXTR negative cyanosis/clubbing, negative edema   SKIN No rashes, No ulcers   NEURO cranial nerves intact, follows commands, motor/sensory function intact   PSYCH alert, A+O to time, place, person   Nursing/Ancillary Notes: **Vital Signs.:   24-May-15 14:29  Vital Signs Type Routine  Temperature Temperature (F) 97.5  Celsius 36.3  Temperature Source oral  Pulse Pulse 91  Respirations Respirations 18  Systolic BP Systolic BP 94  Diastolic BP (mmHg) Diastolic BP (mmHg) 61  Mean BP 72  Pulse Ox % Pulse Ox % 96  Pulse Ox Activity Level  At rest  Oxygen Delivery Room Air/ 21 %   LabObservation:  23-May-15 20:25   OBSERVATION Reason for Test Swelling; hx of DVT  Hepatic:  23-May-15 21:31   Bilirubin, Total 0.2  Alkaline Phosphatase 62 (45-117 NOTE: New Reference Range 02/06/13)  SGPT (ALT) 20  SGOT (AST) 18  Total Protein, Serum 7.3  Albumin, Serum 3.5  Routine Chem:  23-May-15 21:31   Glucose, Serum  124  BUN 9  Creatinine (comp) 0.73  Sodium, Serum 138  Potassium, Serum  3.4  Chloride, Serum 106  CO2, Serum 25  Calcium (Total), Serum 8.7  Osmolality (calc) 276  eGFR (African American) >60  eGFR (Non-African American) >60 (eGFR values <64m/min/1.73 m2 may be an indication of chronic kidney disease (CKD). Calculated eGFR is useful in patients with stable renal function. The eGFR calculation will not  be reliable in acutely ill patients when serum creatinine is changing rapidly. It is not useful in  patients on dialysis. The eGFR calculation may not be applicable to patients at the low and high extremes of body sizes, pregnant women, and vegetarians.)  Anion Gap 7  B-Type Natriuretic Peptide ([facility]) 40 (Result(s) reported on 08 Aug 2013 at 10:17PM.)  Cardiac:  23-May-15 21:31   Troponin I < 0.02 (0.00-0.05 0.05 ng/mL or less: NEGATIVE  Repeat testing in 3-6 hrs  if clinically indicated. >0.05 ng/mL: POTENTIAL   MYOCARDIAL INJURY. Repeat  testing in 3-6 hrs if  clinically indicated. NOTE: An increase or decrease  of 30% or more on serial  testing suggests a  clinically important change)  CK, Total 77 (26-192 NOTE: NEW REFERENCE RANGE  04/20/2013)  CPK-MB, Serum  < 0.5 (Result(s) reported on 08 Aug 2013 at 10:17PM.)  Routine Coag:  23-May-15 21:31   Activated PTT (APTT) 33.3 (A HCT value >55% may artifactually increase the APTT. In one study, the increase was an average of 19%. Reference: "Effect on Routine and Special Coagulation Testing Values of Citrate Anticoagulant Adjustment in Patients with High HCT Values." American Journal of Clinical Pathology 2006;126:400-405.)  Prothrombin 12.5  INR 0.9 (INR reference interval applies to patients on anticoagulant therapy. A single INR therapeutic range for coumarins is not optimal for all indications; however, the suggested range for most indications is 2.0 - 3.0. Exceptions to the INR Reference Range may include: Prosthetic heart valves, acute myocardial infarction, prevention of myocardial infarction, and combinations of aspirin and anticoagulant. The need for a higher or lower target INR must be assessed individually. Reference: The Pharmacology and Management of the Vitamin K  antagonists: the seventh ACCP Conference on Antithrombotic and Thrombolytic Therapy. HWYSH.6837 Sept:126 (3suppl): [id number]. A HCT value >55% may artifactually increase the PT.  In one study,  the increase was an average of 25%. Reference:  "Effect on Routine and Special Coagulation Testing Values of Citrate Anticoagulant Adjustment in Patients with High HCT Values." American Journal of Clinical Pathology 2006;126:400-405.)  24-May-15 04:52   Activated PTT (APTT)  66.5 (A HCT value >55% may artifactually increase the APTT. In one study, the increase was an average of 19%. Reference: "Effect on Routine and Special Coagulation Testing Values of Citrate Anticoagulant  Adjustment in Patients with High HCT Values." American Journal of Clinical Pathology 2006;126:400-405.)    11:30   Activated PTT (APTT)  51.5 (A HCT value >55% may artifactually increase the APTT. In one study, the increase was an average of 19%. Reference: "Effect on Routine and Special Coagulation Testing Values of Citrate Anticoagulant Adjustment in Patients with High HCT Values." American Journal of Clinical Pathology 2006;126:400-405.)  Routine Hem:  23-May-15 21:31   WBC (CBC) 9.7  RBC (CBC) 4.53  Hemoglobin (CBC) 13.2  Hematocrit (CBC) 39.5  Platelet Count (CBC) 225 (Result(s) reported on 08 Aug 2013 at 09:58PM.)  MCV 87  MCH 29.0  MCHC 33.3  RDW 13.7   [place]:    23-May-15 20:25, [place] Color Flow Doppler Upper Extrem Right (Arm)  [place] Color Flow Doppler Upper Extrem Right (Arm)   REASON FOR EXAM:    Swelling; hx of DVT  COMMENTS:   LMP: Post Hysterectomy    PROCEDURE: [place]  - US DOPPLER UP EXTR RIGHT  - Aug 08 2013  8:25PM     CLINICAL DATA:  Pain and swelling and redness right arm    EXAM:  Right UPPER EXTREMITY VENOUS DOPPLERULTRASOUND  TECHNIQUE:  Gray-scale sonography with graded compression, as well as color  Doppler and duplex ultrasound were performed to evaluate the upper  extremity deep venous system from the level of the subclavian vein  and including the jugular, axillary, basilic, radial, ulnar and  upper cephalic vein. Spectral Doppler was utilized to evaluate flow  at rest and with distal augmentation maneuvers.    COMPARISON:  None.    FINDINGS:  Internal Jugular Vein: No evidence of thrombus. Normal  compressibility, respiratory phasicity and response to augmentation.    Subclavian Vein: No evidence of thrombus. Normal compressibility,  respiratory phasicity and response to augmentation.    Axillary Vein: No evidence of thrombus. Normal compressibility,  respiratory phasicity and response to augmentation.  Cephalic Vein: No evidence of thrombus.  Normal compressibility,  respiratory phasicity and response to augmentation.    Basilic Vein: Occlusive thrombus is present in the basilic vein. The  vein is noncompressible. There is hypoechoic clot which is likely  acute.    Brachial Veins: No evidence of thrombus. Normal compressibility,  respiratory phasicity and response to augmentation.    Radial Veins: No evidence of thrombus. Normal compressibility,  respiratory phasicity and response to augmentation.    Ulnar Veins: No evidence of thrombus. Normal compressibility,  respiratory phasicity and response to augmentation.  Venous Reflux:  None visualized.    Other Findings:  None visualized.    IMPRESSION:  Occlusive thrombus in the basilic vein, likely acute.      Electronically Signed    By: Franchot Gallo M.D.    On: 08/08/2013 20:30         Verified By: Truett Perna, M.D.,    Impression 1. Acute Basilic vein thrombosis. Given history of recurrent deep venous thromboses, Hematology on consult.  She has been started on a heparin drip for anticoagulation She does not need a filter at this time 2. Anxiety. Continue with her Klonopin p.r.n.  3. Hypokalemia. Replace potassium to goal of 4 to 5.  4. Venous thromboembolism prophylaxis on heparin drip.   Plan level 3   Electronic Signatures: Hortencia Pilar (MD)  (Signed 24-May-15 15:21)  Authored: General Aspect/Present Illness, Home Medications, Allergies, History and Physical Exam, Vital Signs, Labs, Radiology, Impression/Plan   Last Updated: 24-May-15 15:21 by Hortencia Pilar (MD)

## 2014-07-10 NOTE — H&P (Signed)
PATIENT NAMESuzie [name], [name] MR#:  [id number] DATE OF BIRTH:  [birth year]  DATE OF ADMISSION:  03/04/2014  REFERRING PHYSICIAN:  Lurena Joiner [name]. Shaune Pollack, MD    PRIMARY CARE PHYSICIAN:  At [facility], Cherie Dark, MD, who is a hematologist.    CHIEF COMPLAINT:  Right-sided chest pain.  HISTORY OF PRESENT ILLNESS:  This is a 29 year old Caucasian female with history of recurrent DVT and PE, presenting with right-sided chest pain. She describes 3 to 4-day duration of mid axillary line chest pain on the right side, sharp in quality, 10 out of 10 intensity, pleuritic in nature. No relieving factors. No associated fevers, chills, or shortness of breath. Of note, she was recently taken off anticoagulation about 1 week ago per her hematologist. On investigation in the Emergency Department, she was found to have acute right-sided PE.   REVIEW OF SYSTEMS:  CONSTITUTIONAL:  Denies fevers, chills, fatigue, or weakness.  EYES:  Denies blurred vision, double vision, or eye pain. EARS, NOSE, AND THROAT:  Denies tinnitus, ear pain, or hearing loss. RESPIRATORY:  Denies cough, wheeze, or shortness of breath. CARDIOVASCULAR:  Positive for chest pain. Denies any palpitations.  GASTROINTESTINAL:  Denies any nausea, vomiting, diarrhea, or abdominal pain.  GENITOURINARY:  Denies dysuria or hematuria.  ENDOCRINE:  Denies nocturia or thyroid problems.  HEMATOLOGIC AND LYMPHATIC:  Denies easy bruising or bleeding.  SKIN:  Denies rash or lesions.  MUSCULOSKELETAL:  Denies pain in the neck, back, shoulders, knees, or hips or arthritic symptoms.  NEUROLOGIC:  Denies paralysis or paresthesias. PSYCHIATRIC:  Denies anxiety or depressive symptoms.  Otherwise, full review of systems performed by me is negative.   PAST MEDICAL HISTORY:  Recurrent DVTs with history of 5 to 6 DVTs in the past as well as anxiety not otherwise specified.   SOCIAL HISTORY:  Positive for everyday tobacco use. Denies any alcohol or drug use.   FAMILY  HISTORY:  Denies any known cardiovascular or pulmonary disorders. Denies any knowledge of clotting issues.   ALLERGIES:  IBUPROFEN AND PENICILLIN AS WELL AS XARELTO; HOWEVER, XARELTO'S ALLERGY IS BLEEDING.   HOME MEDICATIONS:  Include Klonopin 1 mg p.o. b.i.d.   PHYSICAL EXAMINATION: VITAL SIGNS:  Temperature 98.6, heart rate 99, respirations 20, blood pressure 107/52, saturating 99% on room air. Weight 104.3 kg, BMI 38.3.  GENERAL:  Well-nourished, well-developed Caucasian female, currently in no acute distress.  HEAD:  Normocephalic, atraumatic.  EYES:  Pupils are equal, round, and reactive to light. Extraocular muscles are intact. No scleral icterus.  MOUTH:  Moist mucous membranes. Dentition is intact. No abscess noted.  EARS, NOSE, AND THROAT:  Clear. No exudates. No external lesions.  NECK:  Supple. No thyromegaly. No nodules. No JVD.  PULMONARY:  Clear to auscultation bilaterally; however, diminished breath sounds throughout all lung fields secondary to poor respiratory effort.  CHEST:  Minimal tenderness to palpation of the right axillary side; however, no frank wheezes, rales, or rhonchi. Poor respiratory effort as stated above, however, not tachypneic.  CARDIOVASCULAR:  S1 and S2, regular rate and rhythm. No murmurs, rubs, or gallops. No edema. Pedal pulses are 2+ bilaterally.  GASTROINTESTINAL:  Soft, nontender, nondistended. No masses. Positive bowel sounds. No hepatosplenomegaly. MUSCULOSKELETAL:  No swelling, clubbing, or edema. Range of motion is full in all extremities.  NEUROLOGIC:  Cranial nerves II through XII are intact. No gross neurologic deficits. Sensation is intact. Reflexes are intact. SKIN:  No ulcerations, lesions, rashes, or cyanosis. Skin is warm and dry. Turgor is intact.  PSYCHIATRIC:  Mood and affect are within normal limits. Alert and oriented x 3. Insight and judgment are intact.   LABORATORY DATA:  Chest x-ray performed reveals no acute cardiopulmonary  process. CT of the chest performed reveals acute pulmonary emboli noted in the lower lobe branches of the pulmonary arteries bilaterally; however, new, ill-defined opacity in the posterior and right lower lobe, which may represent pulmonary infarction. Remainder of laboratory data:  , potassium 3.6, chloride 107, bicarbonate 22, BUN 11, creatinine 0.82, and glucose is 141. Troponin is less than 0.02. WBC is 14.4, hemoglobin 13.2, and platelets 214,000. Urinalysis is negative for evidence of infection.   ASSESSMENT AND PLAN:  A 29 year old Caucasian female with a history of recurrent deep vein thrombosis and pulmonary embolus, presenting with acute onset of right-sided chest pain. Of note, she was recently taken off her anticoagulation about 1 week ago [facility]   1.  Acute right-sided pulmonary embolus. Place on Lovenox therapy. Place on telemetry. Trend cardiac enzymes x 3. She will once again need lifelong anticoagulation, as mentioned on her last admission. Provide supplemental oxygen as required to keep saturation greater than 90% as well as incentive spirometry. 2.  Anxiety, not otherwise specified. Klonopin.  3.  Venous thromboembolism prophylaxis with Lovenox.   CODE STATUS:  The patient is a full code.   TIME SPENT:  45 minutes.     ____________________________ Cletis Athensavid K. Breylen Agyeman, MD dkh:nb D: 03/04/2014 00:37:23 ET T: 03/04/2014 01:21:06 ET JOB#: [id number]  cc: Cletis Athensavid K. Benji Poynter, MD, <Dictator> Melenie Minniear Synetta ShadowK Harjas Biggins MD ELECTRONICALLY SIGNED 03/04/2014 20:36

## 2014-07-10 NOTE — Op Note (Signed)
PATIENT NAMESuzie [name], [name] MR#:  [id number] DATE OF BIRTH:  05-Sep-1985  DATE OF PROCEDURE:  07/14/2013  PREOPERATIVE DIAGNOSIS: Menorrhagia.   POSTOPERATIVE DIAGNOSIS: Menorrhagia.  OPERATION PERFORMED: Da Vinci-assisted total laparoscopic hysterectomy, left salpingectomy, lysis of adhesions and cystoscopy.   ANESTHESIA USED: General.  PRIMARY SURGEON: Lorrene Reid, MD.  ASSISTANT: Annamarie Major, MD.   PERIOPERATIVE ANTIBIOTICS: 2 grams Ancef.   DRAINS OR TUBES:  Foley to gravity drainage.   IMPLANTS: None.   ESTIMATED BLOOD LOSS: 10 mL.  OPERATIVE FLUIDS: 900 mL of crystalloid.   COMPLICATIONS: None.   INTRAOPERATIVE FINDINGS: The patient is a 29 year old G4, P4-0-0-4, status post prior surgical sterilization via tubal ligation, who has a history of multiple DVTs. Secondary to her DVT history she is not a candidate for estrogen-containing contraceptives for management of her menorrhagia. She has previously tried and failed both menorrhagia and Depo-Provera. The case was discussed with the patient's hematologist prior to proceeding with the case. The patient was noted to have a normal appearing uterus, normal left tube with evidence of prior partial salpingectomy. The right tube was not visualized. Normal ovaries bilateral. Normal uterus and cervix. The patient had dense adhesions of the omentum to the anterior abdominal wall at the level of the umbilicus at the site of her prior BTL incision.   COMPLICATIONS: None.   SPECIMENS REMOVED: Uterus, cervix and left tube.   THE PATIENT CONDITION FOLLOWING PROCEDURE: Stable.   PROCEDURE IN DETAIL: Risks, benefits, and alternatives of the procedure were discussed with the patient prior to proceeding to the operating room. The patient was prophylaxed with both SCDs as well as 5000 units subcutaneous heparin for the surgery. On arriving to the operating room, the patient was placed under general endotracheal anesthesia. She was  positioned in the dorsal lithotomy position using Allen stirrups, prepped and draped in the usual sterile fashion. Prior to draping, the patient was tested in steep Trendelenburg and peak inspiratory pressures were acceptable. The patient did not slide on testing. Once the patient had been prepped and draped, attention was turned to the patient's pelvis. A Foley catheter was placed to drain the bladder. An operative speculum was then placed to visualize the anterior lip of the cervix, which was grasped with a single-tooth tenaculum. A large V-Care device was then placed to allow manipulation of the uterus. After placement of the V-Care device, a single-tooth tenaculum and the operative speculum were removed. Attention was turned to the patient's abdomen. The umbilicus was infiltrated with 1% plain lidocaine. A stab incision was made in the base of the umbilicus and a Veress needle was used to gain entry into the peritoneal cavity. Two clicks were heard. Water drop test confirmed intraperitoneal entry. The opening pressure was 8 mmHg.   Once insufflation had been established, an 8 mm robotic trocar was placed through the umbilicus. The camera was then introduced into the abdomen. Dense adhesions of omentum to the umbilicus were noted. There were no bowels noted in the adhesions. A left 8 mm robotic port was then placed under direct visualization. The camera was moved from the umbilical port to the left lower quadrant port to re-inspect the omental adhesions, once again confirming no bowel was  contained within the adhesions. A second 8 mm right robotic port was then placed under direct visualization and a Harmonic scalpel was used to take down the adhesions sufficiently to allow the scope to enter the abdomen without touching the omentum. Following this, an 11 mm  right upper quadrant assistant port was placed under direct visualization. The patient was placed in steep Trendelenburg. The robot was brought in and  docked. The right arm 1 had a robotic monopolar scissor, arm 2 had the bipolar grasper.   Attention was turned to the patient's right adnexa. The ovary was grasped and the utero-ovarian ligament was cauterized using the bipolar grasper and then transected using the monopolar scissors. The round ligament was identified, cauterized and transected using the bipolar scissors. The anterior leaf of the broad ligament was then dissected down to the level of the internal cervical os to start a bladder flap. The posterior leaf of the broad ligament was taken down to the level of the right uterosacral. The uterine artery was skeletonized, cauterized with bipolar and transected using the monopolar scissors. Attention was then turned to the patient's left adnexa. There was tube noted in the left adnexa. This was freed from its attachment to the left ovary and the mesosalpinx using combination of bipolar cautery and with monopolar scissors. The utero-ovarian ligament was cauterized and transected using the monopolar scissors. The round ligament was also cauterized and transected using the monopolar scissors. The anterior leaf was then dissected down to the level of the internal cervical os meeting the previously made bladder flap. The bladder was further mobilized off of the specimen beyond the level of the V-Care cup. The posterior leaf of the broad ligament was dissected down to the level of the left uterosacral. The artery was then skeletonized and cauterized using the bipolar grasper and transected using the monopolar scissors. An anterior colpotomy was then made, carried around circumferentially freeing the specimen from its attachment to the vagina. The specimen was delivered vaginally. The pelvis was irrigated and the vaginal cuff was closed using a #1 V-Loc suture in a running fashion doubling back on itself for the final stitch.   Following this, the pelvis was once again irrigated. All pedicles were inspected and  noted to be hemostatic. The robot was undocked and the ports were removed. All port sites were closed with 4-0 Monocryl and Dermabond. Attention was then turned to the patient's pelvis. A cystoscopy was performed noting an intact bladder dome with bilateral efflux of urine from both ureters. Sponge, needle, and instrument counts were correct x 2. The patient tolerated the procedure well and was taken to the recovery room in stable condition.   ____________________________ Florina OuAndreas M. Bonney AidStaebler, MD ams:aw D: 07/15/2013 07:24:39 ET T: 07/15/2013 07:44:27 ET JOB#: [id number]  cc: Florina OuAndreas M. Bonney AidStaebler, MD, <Dictator> Lorrene ReidANDREAS M Lynsie Mcwatters MD ELECTRONICALLY SIGNED 07/26/2013 11:29

## 2014-07-10 NOTE — Consult Note (Signed)
PATIENT NAMSuzie [name]:  [name] MR#:  [id number] DATE OF BIRTH:  [birth year]  DATE OF CONSULTATION:  08/09/2013  REFERRING PHYSICIAN:   CONSULTING PHYSICIAN:  Knute Neuobert G. Lorre NickGittin, MD  HISTORY OF PRESENT ILLNESS:  [name] is a 29 year old patient who was admitted on May 23rd. She had a brachial vein clot with symptomatic pain in the arm and was shown to have a PE. She has history of multiple DVTs. Prior [facility] and [facility]. Prior seeing Dr. Orlie DakinFinnegan in February, on February 25th. The patient was recommended for Lovenox 100 mg q.12 hours, LATER D/C. Marland Kitchen. She also had GYN surgery back in March. She has no bleeding. She had some shortness of breath. She had an episode of shortness of breath the day before admission. No pleurisy. Currently no acute complaints.   PAST MEDICAL HISTORY: Has been noted for smoking. No alcohol. No family history of clotting diseases.   ALLERGIES: IBUPROFEN AND PENICILLIN, ALSO INTOLERANCE TO XARELTO.   Also notably, in her history. She would not take Coumadin because she would not be compliant with it and said she could not afford the repeated pro times.   REVIEW OF SYSTEMS: Currently unremarkable with no headache, dizziness, no chills. No sweats, no chest or abdominal pain. No nausea, vomiting, diarrhea. No edema. No bone pain. Has pain in the right arm. No rash. No bruising.   PHYSICAL EXAMINATION:  GENERAL:  No edema.  LUNGS: Clear.  HEART: Regular.  ABDOMEN: Benign.  EXTREMITIES:  No edema.  NEUROLOGIC: Grossly nonfocal.   LABORATORY, DIAGNOSTIC AND RADIOLOGICAL DATA:  Creatinine was 0.73 on admission. White count 9.7, hemoglobin 13.2, platelets are 225. Sodium was 130.   IMPRESSION AND PLAN: As per medicine, replacing some low potassium on admission and should watch the serum sodium. Heparin given IV drip. Vascular surgery has seen and stated that a filter is not indicated. As per Dr. Milinda CaveFinnegan's evaluation of February 25th of this year, initially [facility]  did not recommend lifetime anticoagulation. The patient did have a lupus inhibitor in the past. 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 At that time then it was recommended that the patient did not require lifetime anticoagulation. She should have had regular anticoagulation subsequent to her gynecologic procedure.   Currently, with another recurrent clot, it looks like patient needs continued anticoagulation, at least 6 months for now with pulmonary embolism. Likely this event would be enough to recommend lifetime. That decision can be made later, after re-evaluation of her history. She should be on heparin until she is converted to Lovenox. She is Lovenox 100 mg q.12 hours. We should check a HEPARIN ANTI XA level 3 hours after the third dose of Lovenox to make sure she is therapeutic. The patient on discharge will follow up with [facility]. We should recheck a lupus inhibitor. No role of checking a d-dimer at the current time.   ____________________________ Knute Neuobert G. Lorre NickGittin, MD rgg:cs D: 08/09/2013 17:43:15 ET T: 08/09/2013 18:56:11 ET JOB#: [id number]  cc: Knute Neuobert G. Lorre NickGittin, MD, <Dictator> Marin RobertsOBERT G Kysen Wetherington MD ELECTRONICALLY SIGNED 08/12/2013 11:55

## 2014-07-10 NOTE — Discharge Summary (Signed)
PATIENT NAME:  [name], [name] MR#:  [id number] DATE OF BIRTH:  [birth year]  DATE OF ADMISSION:  01/10/2014 DATE OF DISCHARGE:  01/11/2014  PRESENTING COMPLAINT:  Abdominal pain and right arm pain.   DISCHARGE DIAGNOSES: 1.  Acute diverticulitis.  2.  Recurrent right superficial cephalic vein thrombosis.  3.  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  DISCHARGE MEDICATIONS:  1.  Klonopin 1 mg b.i.d.  2.  Omeprazole 40 mg daily.  3.  Metronidazole 500 mg every 8 hours.  4.  Cipro 500 b.i.d.  5.  Apixaban 10 mg b.i.d. for first 7 days and then 5 mg b.i.d.  6.  Acetaminophen/hydrocodone 5/325 one every 4 hours as needed.   The patient was advised to hold off on her Lovenox at this time since she has been started on apixaban.   FOLLOWUP:  Follow up with [facility]phan [facility]oll at [facility] on October 29 at 10:30 a.m.   LABORATORY DATA:  Basic metabolic panel was within normal limits. Urinalysis was negative for UTI.   IMAGING:  Ultrasound of right upper extremity showed acute right cephalic vein superficial thrombosis.   BRIEF SUMMARY OF HOSPITAL COURSE:   1.  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 She was found to have acute colon diverticulitis with diverticulosis and started on p.o. Flagyl and Cipro. No diarrhea. White count was stable. No fever. The patient was requesting to go home.  2.  Right vein superficial thrombosis. The patient is on lifelong anticoagulation with subcutaneous Lovenox b.i.d. She requested if she can be switched to p.o. Eliquis. I spoke with Kennyth ArnoldStacy, RN, where Dr. [facility] and informed about switching from Lovenox to Eliquis. The patient was given a 30-day free trial card along with prescription for  Eliquis and instruction to take it. She did receive the first dose in the hospital. She will follow up with Dr. Isaiah SergeMoll on October 29.  3.  Obesity.   Hospital stay otherwise remained stable.   CODE STATUS:  The patient remained a full code.   TIME SPENT:  40 minutes.    ____________________________ Wylie HailSona A. Allena KatzPatel, MD sap:nb D: 01/12/2014 19:42:52 ET T: 01/13/2014 06:37:48 ET JOB#: [id number]  cc: Lehua Flores A. Allena KatzPatel, MD, <Dictator> Cherie DarkStephan Moll, MD   Willow OraSONA A Anne Boltz MD ELECTRONICALLY SIGNED 01/28/2014 7:41

## 2014-07-10 NOTE — Discharge Summary (Signed)
PATIENT NAME:  [name], [name] MR#:  [id number] DATE OF BIRTH:  [birth year]  DATE OF ADMISSION:  08/08/2013 DATE OF DISCHARGE:  08/10/2013  REASON FOR ADMISSION AND CHIEF COMPLAINT: Arm pain.  Admission due to DVT of the right upper extremity.   DISCHARGE DIAGNOSES:  1. Basilar acute deep venous thrombosis. 2. Pulmonary embolus. 3. Anxiety.  4. Hypokalemia. 5. Ongoing smoking.   DISPOSITION: Home.  MEDICATIONS AT DISCHARGE: Klonopin 1 mg 2 times a day, Lovenox 100 mg 2 times daily, and Tylenol with hydrocodone 5/325 mg 1 to 2 every 6 hours as needed for pain.  HOSPITAL COURSE: A 29 year old female with history of recurring DVTs, about 5 so far. [facility] and by Dr. Orlie DakinFinnegan. Comes to the Emergency Department on 08/08/2013 complaining of right upper extremity pain. The pain is in the middle of her arm and intensity was 8/10. No radiating, worse with movements or palpitation.  The patient had an episode of shortness of breath the day prior to admission, which lasted for 20 minutes or so and got better by itself. as far as risk for DVT  the patient had a recent surgery, hysterectomy, for what she was kept on Lovenox for 2 months after surgery and she was told to stop them. So this patient has an interesting history where she has had 5 DVTs in the past. All the workup done outpatient for hypercoagulability has come back negative. The patient was told by Dr. Orlie DakinFinnegan that she only will need 3 months of anticoagulation after her surgery and then she could remove it.  As it is right now, the patient comes back with a pulmonary embolus on top of a DVT of the right upper extremity for what she might need anticoagulation for life. We requested a hematologic consult. Dr.Gittin,  As far as her anticoagulation history, the patient has been taking Lovenox 100 mg twice daily. This is likely a low dose for her. She is heavier and need a higher dose but she cannot afford to give herself anymore  Lovenox. . The patient will monitor. Reviewing Dr. Milinda CaveFinnegan's notes, he states that the patient has been on Xarelto before developing effects that she could not tolerate. She gets really dizzy, lightheaded any time that she took it for what she stopped it. The patient cannot go on Coumadin because she is very unreliable on checking her INRs. She cannot do her INR checks often enough that could be safe for what this is not a really good option.  Pradaxa or Eliquis have not been approved by the FDA for treatment of DVTs and PEs for what we only option of Lovenox at this moment.   Vascular consultation was requested to evaluate for IVC filter as she has a recurrent or chro0nci lower ext dvt ( this has been recomended by hem/onc in the past ) . So the patient did well during this hospitalization, wanted to go to smoke outside. Smoking cessation counseling given to the patient that this is a very important thing. She is a high risk of DVTs.  FOLLOWUP: With Dr. Orlie DakinFinnegan. Follow up with onc   Clinic.  I spent about 40 minutes discharging this patient.    ____________________________ Felipa Furnaceoberto Sanchez Gutierrez, MD rsg:lt D: 08/15/2013 23:44:58 ET T: 08/16/2013 07:31:07 ET JOB#: [id number]  cc: Felipa Furnaceoberto Sanchez Gutierrez, MD, <Dictator> Mariaeduarda Defranco Juanda ChanceSANCHEZ GUTIERRE MD ELECTRONICALLY SIGNED 08/22/2013 12:51 Falicia Lizotte Juanda ChanceSANCHEZ GUTIERRE MD ELECTRONICALLY SIGNED 08/22/2013 12:53

## 2014-07-10 NOTE — Consult Note (Signed)
PATIENT NAMSuzie [name]:  [name], [name] MR#:  [id number] DATE OF BIRTH:  [birth year]  DATE OF CONSULTATION:  08/09/2013  REFERRING PHYSICIAN:   CONSULTING PHYSICIAN:  Knute Neuobert G. Lorre NickGittin, MD  ADDENDUM:  This is a continuation/correction.    CORRECTION TO THE PHYSICAL EXAMINATION:  The right lower extremity is larger than the left, nontender and the patient reports this is chronic with waxing and waning.   Also, with respect to future plans, although the most recent clot was later shown to be chronic with a lower extremity deep vein thrombosis in 2014, now recurrent clot and PE, considering also the clinical factors of chronic swelling in the lower extremity, the patient's body mass index, factors currently favor long-term anticoagulation. In fact, this should be reviewed. Also, the lupus inhibitor that had been positive and then negative in the past should be repeated.   ____________________________ Knute Neuobert G. Lorre NickGittin, MD rgg:cs D: 08/09/2013 17:55:57 ET T: 08/09/2013 19:20:57 ET JOB#: [id number]  cc: Knute Neuobert G. Lorre NickGittin, MD, <Dictator> Marin RobertsOBERT G GITTIN MD ELECTRONICALLY SIGNED 08/12/2013 11:55

## 2014-07-10 NOTE — Consult Note (Signed)
Brief Consult Note: Diagnosis: recurrent dvt,  currently basillic vein , pe.   Patient was seen by consultant.   Consult note dictated.   Comments: SEE DICTATED NOTE  NOTHING ACUTE FROM HEMATOLOGY. HAS HX RECURRENT DVT, PRIOR W/U [facility]. AT ONE TIME ON LOVENOX 100MG  SQ Q12H . LATER RECOMMENDED DID NOT NEED LIFETIME ANTICOAGULATION. Marland Kitchen. ALSO RECENT GYN SURGERY, JUST COMPLETED 6 WEEKS OF PROPHYLACTIC DOSE LOVENOA, STOPPED 1 WEEK AGO.  Marland Kitchen. VASC SURGERY HAS SEEN ,,,EVALUATED AND HAS NOT RECOMMENDED A FILTER.    PLAN  IV HEPARIN OR CHANGE TO LOVENOX 100MG  SUBCUT Q12H. IF START LOVENOX, CHECK HEPARIN ANTI XA LEVEL 3 HRS AFTER 3RD DOSE TO CHECK IF LEVEL IS THERAPEUTIC. LOWER EXT DOPPLERS. F/U IN CANCER CENTER WITH DR Orlie DakinFINNEGAN 24 HRS POST DISCHARGE. CURRENTLY WITH RECURRENT CLOT, CHRONIC SWELLING RIGHT LEG, BMI, CONSTELLATION OF FACTORS FASVORING LIFETIME ANTICOAGULATION.  Electronic Signatures: Marin RobertsGittin, Calandria Mullings G (MD)  (Signed 24-May-15 17:51)  Authored: Brief Consult Note   Last Updated: 24-May-15 17:51 by Marin RobertsGittin, Ellijah Leffel G (MD)

## 2014-07-10 NOTE — H&P (Signed)
PATIENT NAMSuzie [name]:  [name], [name] MR#:  [id number] DATE OF BIRTH:  04-Jul-1985  DATE OF ADMISSION:  01/10/2014  PRIMARY CARE PHYSICIAN: Nonlocal.   REFERRING PHYSICIAN: Dr. Darnelle CatalanMalinda.  CHIEF COMPLAINT: Right lower abdominal pain for 1 day and right arm pain for 2 days.    HISTORY OF PRESENT ILLNESS: A 29 year old Caucasian female with a history of recurrent DVT and PE presented to the ED with the above chief complaint. The patient is alert, awake, oriented, in no acute distress. The patient said she got an IV injection of fluid and medication 4 days ago, and then developed right arm pain for the past 2 days, but she denies any fevers or chills. No erythema on the right arm. The patient also developed right lower quadrant abdominal pain today which is aching, intermittent, without radiation. The patient denies any nausea, vomiting, diarrhea. No melena or bloody stools. Denies any constipation. CAT scan of the abdomen and pelvis show colon diverticulitis. Right arm duplex showed DVT. The patient has history of recurrent DVT 6 times and also PE. The patient was supposed to take Lovenox every day but sometimes she did not take it. She said she did not take Lovenox for the past couple days.   PAST MEDICAL HISTORY: Recurrent DVT, PE, and anxiety.   PAST SURGICAL HISTORY: Hysterectomy.   SOCIAL HISTORY: Smokes tobacco, 1/2 to 1 pack a day for many years and still smoking. Denies any alcohol drinking or illicit drugs.   FAMILY HISTORY:  No hypertension, diabetes, heart attack or stroke. No DVT or PE in her family.   ALLERGIES: IBUPROFEN, PENICILLIN, XARELTO.   HOME MEDICATIONS: Norco 5 mg/325 mg p.o. every 4 hours p.r.n., Lovenox 100 mg per mL 1 injectable twice a day, Klonopin 1 mg p.o. b.i.d.,  Flomax 0.4 mg p.o. once a day.  REVIEW OF SYSTEMS: CONSTITUTIONAL: The patient denies any fever or chills. No headache or dizziness. No weakness.  EYES: No double lesion, blurred vision.  ENT: No postnasal drip,  slurred speech or dysphagia.  CARDIOVASCULAR: No chest pain, palpitation, orthopnea, nocturnal dyspnea. No leg edema.  PULMONARY: No cough, sputum, shortness of breath or hemoptysis.  GASTROINTESTINAL: Positive for abdominal pain. No nausea, vomiting, diarrhea. No melena or bloody stool.  GENITOURINARY: No dysuria, hematuria, or incontinence.  SKIN: No rash or jaundice.  NEUROLOGIC: No syncope, loss of consciousness, or seizure.  ENDOCRINOLOGY: No polyuria, polydipsia, heat or cold intolerance.  HEMATOLOGY: No easy bruising or bleeding.   PHYSICAL EXAMINATION: VITAL SIGNS: Temperature 98, blood pressure 107/68, pulse 82, oxygen saturation 96% on room air.  GENERAL: The patient is alert, awake, oriented, in no acute distress.  HEENT: Pupils round, equal, and reactive to light and accommodation. Moist oral mucosa. Clear oropharynx.  NECK: Supple. No JVD or carotid bruits. No lymphadenopathy. No thyromegaly.  CARDIOVASCULAR: S1, S2 regular rate and rhythm. No murmurs, gallop.  PULMONARY: Bilateral air entry. No wheezing or rales. No use of accessory muscles to breathe.  ABDOMEN: Soft. No distention or tenderness. No organomegaly. Bowel sounds present.  EXTREMITIES: No edema, clubbing or cyanosis. No calf tenderness. There is mild tenderness of her right upper extremities but no erythema or swelling.  SKIN: No rash or jaundice.  NEUROLOGY: A and O x 3. No focal deficit. Power 5/5. Sensory intact.   LABORATORY DATA: A CAT scan of the abdomen and pelvis showed right colon diverticulitis and also showed diverticula.   Urinalysis showed WBC 14, RBC 2, nitrite negative.   Right arm duplex  showed right cephalic vein superficial thrombosis and thrombophlebitis. CBC in normal range. Glucose 104, BUN 6, creatinine 0.74, electrolytes normal. HCG is less than 1.   IMPRESSIONS: 1.  Acute colon diverticulitis with diverticula.   2.  Right arm vein superficial thrombosis or thrombophlebitis.  3.   Urinary tract infection.  4.  Obesity.   PLAN OF TREATMENT:  1.  The patient will be admitted to medical floor. The patient was treated with Cipro and Flagyl in the ED. We will continue Cipro, Flagyl, follow up CBC, urine culture. 2.  For right arm superficial thrombosis, continue Lovenox 1 mg/kg q. 12, hours. We will get an oncology consult. Otherwise, the patient to be compliant to her medication.  3.  For tobacco abuse, smoking cessation was counseled for 4 minutes. We will give a nicotine patch.  4.  I discussed the patient's condition and plan of treatment with the patient and the patient's mother and boyfriend.    TIME SPENT: About 53 minutes.    ____________________________ Shaune Pollack, MD qc:at D: 01/10/2014 15:34:28 ET T: 01/10/2014 15:55:51 ET JOB#: [id number]  cc: Shaune Pollack, MD, <Dictator> Shaune Pollack MD ELECTRONICALLY SIGNED 01/10/2014 18:05

## 2014-07-10 NOTE — H&P (Signed)
PATIENT NAMSuzie [name]:  [name], [name] MR#:  [id number] DATE OF BIRTH:  [birth year]  DATE OF ADMISSION:  08/08/2013  REFERRING PHYSICIAN: Dr. Margarita GrizzleWoodruff .  PRIMARY CARE PHYSICIAN: [facility].     CHIEF COMPLAINT: Arm pain.  HISTORY OF PRESENT ILLNESS: A 29 year old Caucasian female with history of recurrent DVTs who apparently had about 5 DVTs thus far. [facility] as well as Dr. Orlie DakinFinnegan in our hospital facility. She is presenting with arm pain, describes 2-day duration of right upper extremity pain located over the medial aspect of the biceps, sharp in quality, 8/10 in intensity, nonradiating, worse with movements as well as direct palpation. No relieving factors, associated swelling and erythema. She noticed a brief episode of shortness of breath 1 day prior to arrival to the Emergency Department and denies any chest pain. Denies any pleuritic symptoms. No fevers, chills, or further symptomatology.  REVIEW OF SYSTEMS:  CONSTITUTIONAL: Denies fever, chills, fatigue, weakness. EYES: Denies blurry, double vision, or eye pain.  HEENT: Denies tinnitus, ear pain, hearing loss.  RESPIRATORY: Positive for shortness of breath as described above. Denies any cough or wheeze.  CARDIOVASCULAR: Denies chest pain, palpitations, edema.  GASTROINTESTINAL: Denies nausea, vomiting, diarrhea, abdominal pain.  GENITOURINARY: Denies dysuria or hematuria.  ENDOCRINE: Denies nocturia or thyroid problems.  HEMATOLOGIC AND LYMPHATIC: Denies easy bruising, bleeding. SKIN: Denies rash or lesions. MUSCULOSKELETAL: Positive for pain in right arm as described above. Denies any pain in neck, back, shoulders, knees, hips, or arthritic symptoms.  NEUROLOGIC: Denies any paralysis or paresthesias.  PSYCHIATRIC: Denies anxiety or depressive symptoms.   Otherwise, full review of systems performed by me is negative.   PAST MEDICAL HISTORY: Depression not otherwise specified, as well as  recurrent DVTs, apparently she has had 5 DVTs in the past, has [facility] and with Dr. Orlie DakinFinnegan. She had previously been on long-term anticoagulation; however, due to financial issues, she has stopped Lovenox about 1 to 2 weeks ago.   SOCIAL HISTORY: Positive for tobacco use. Denies any alcohol or drug usage.   FAMILY HISTORY: Denies any known clotting disorders or cardiovascular disease.   ALLERGIES: IBUPROFEN AND PENICILLIN.   HOME MEDICATIONS: Include Klonopin 1 mg p.o. 3 times daily as needed for anxiety.   PHYSICAL EXAMINATION:  VITAL SIGNS: Temperature 98.7, heart rate 92, respirations 18, blood pressure 124/74, saturating 97% on room air. Weight 108.9 kg, BMI of 39.9.  GENERAL: Well-nourished, well-developed Caucasian female, currently in no acute distress.  HEAD: Normocephalic, atraumatic.  EYES: Pupils equal, round, reactive to light. Extraocular muscles intact. No scleral icterus.  MOUTH: Moist mucous membranes.  Dentition intact. No abscess noted.  EARS, NOSE, AND THROAT: Clear without exudates. No external lesions.  NECK: Supple. No thyromegaly. No nodules. No JVD.  PULMONARY: Clear to auscultation bilaterally without wheezes, rales, or rhonchi. No use of accessory muscles. Good respiratory effort.  CHEST:  Nontender to palpation.  CARDIOVASCULAR: S1, S2, regular rate and rhythm. No murmurs, rubs, or gallops. No edema. Pedal pulses 2+ bilaterally.  GASTROINTESTINAL: Soft, nontender, nondistended. No masses. Positive bowel sounds. No hepatosplenomegaly.  MUSCULOSKELETAL: On medial aspect of right upper extremity, noted swelling and erythema in area which is tender to palpation. Otherwise full range of motion. No further swelling, edema, or clubbing.  NEUROLOGIC: Cranial nerves II-XII intact. No gross neurologic deficits. Sensation intact. Reflexes intact.  SKIN: No ulcerations, lesions, rashes, or cyanosis other than described above. Skin warm and dry. Turgor  intact.  PSYCHIATRIC: Mood and affect within normal limits. The patient awake, alert, and oriented x 3. Insight and judgment intact.   LABORATORY DATA: Right upper extremity ultrasound reveals an occlusive thrombus in the basilic vein.  Remainder of laboratory data: Sodium 130, potassium 3.4, chloride 106, bicarbonate 25, BUN 9, creatinine 0.73, glucose 124, WBC 9.7, hemoglobin 13.2, platelets of 225,000.   ASSESSMENT AND PLAN: A 29 year old female with a history of recurrent deep venous thromboses presenting with arm pain found to have acute deep venous thrombosis. 1. Basilar acute deep venous thrombosis. Given history of recurrent deep venous thromboses, we will consult hematology oncology. She appears to follow with Dr. Orlie Dakin. Apparently, she had financial issues with Lovenox as well as warfarin and intolerance to Xarelto. We will also consult social work to see if we can obtain  some sort of financial aid so she can actually receive these medications. She has been started on a heparin drip for anticoagulation thus far, provided pain medication as well as add bowel regimen. CT to rule out is pulmonary embolus is pending at this time given her brief episode of shortness of breath.  2. Anxiety. Continue with her Klonopin p.r.n.  3. Hypokalemia. Replace potassium to goal of 4 to 5.  4. Venous thromboembolism prophylaxis on heparin drip.   CODE STATUS: The patient is full code.   TIME SPENT: 45 minutes.   ____________________________ Cletis Athens. Hower, MD dkh:lt D: 08/08/2013 22:11:12 ET T: 08/09/2013 00:18:38 ET JOB#: [id number]  cc: Cletis Athens. Hower, MD, <Dictator> DAVID Synetta Shadow MD ELECTRONICALLY SIGNED 08/09/2013 20:31

## 2014-07-12 ENCOUNTER — Inpatient Hospital Stay
Admit: 2014-07-12 | Disposition: A | Discharge status: Home / Self Care | Payer: Self-pay | Attending: Internal Medicine | Admitting: Internal Medicine

## 2014-07-12 LAB — BASIC METABOLIC PANEL
Anion Gap: 5 — ABNORMAL LOW (ref 7–16)
BUN: 13 mg/dL
Calcium, Total: 8.5 mg/dL — ABNORMAL LOW
Chloride: 105 mmol/L
Co2: 25 mmol/L
Creatinine: 1.08 mg/dL — ABNORMAL HIGH
EGFR (African American): >60
EGFR (Non-African Amer.): >60
Glucose: 126 mg/dL — ABNORMAL HIGH
Potassium: 4 mmol/L
SODIUM: 135 mmol/L

## 2014-07-12 LAB — CBC WITH DIFFERENTIAL/PLATELET
Basophil #: 0.2 10*3/uL — ABNORMAL HIGH (ref 0.0–0.1)
Basophil %: 1.6 %
EOS PCT: 2.2 %
Eosinophil #: 0.3 10*3/uL (ref 0.0–0.7)
HCT: 41.2 % (ref 35.0–47.0)
HGB: 13.4 g/dL (ref 12.0–16.0)
LYMPHS PCT: 27.2 %
Lymphocyte #: 3.4 10*3/uL (ref 1.0–3.6)
MCH: 27.5 pg (ref 26.0–34.0)
MCHC: 32.5 g/dL (ref 32.0–36.0)
MCV: 85 fL (ref 80–100)
MONOS PCT: 3.3 %
Monocyte #: 0.4 x10 3/mm (ref 0.2–0.9)
NEUTROS PCT: 65.7 %
Neutrophil #: 8.3 10*3/uL — ABNORMAL HIGH (ref 1.4–6.5)
PLATELETS: 243 10*3/uL (ref 150–440)
RBC: 4.87 10*6/uL (ref 3.80–5.20)
RDW: 14.6 % — AB (ref 11.5–14.5)
WBC: 12.7 10*3/uL — AB (ref 3.6–11.0)

## 2014-07-12 LAB — APTT: Activated PTT: 32.8 secs (ref 23.6–35.9)

## 2014-07-12 LAB — HEPARIN LEVEL (UNFRACTIONATED)
ANTI-XA(UNFRACTIONATED): 0.16 [IU]/mL — AB (ref 0.30–0.70)
Anti-Xa(Unfractionated): 0.31 IU/mL (ref 0.30–0.70)

## 2014-07-12 LAB — PROTIME-INR
INR: 1.3
PROTHROMBIN TIME: 16 s — AB

## 2014-07-13 LAB — LIPID PANEL
Cholesterol: 161 mg/dL
HDL Cholesterol: 21 mg/dL — ABNORMAL LOW
Triglycerides: 500 mg/dL — ABNORMAL HIGH

## 2014-07-13 LAB — CBC WITH DIFFERENTIAL/PLATELET
Basophil #: 0 10*3/uL (ref 0.0–0.1)
Basophil %: 0.6 %
Eosinophil #: 0.2 10*3/uL (ref 0.0–0.7)
Eosinophil %: 3.2 %
HCT: 37.6 % (ref 35.0–47.0)
HGB: 12.1 g/dL (ref 12.0–16.0)
Lymphocyte #: 3.4 10*3/uL (ref 1.0–3.6)
Lymphocyte %: 43.6 %
MCH: 27.1 pg (ref 26.0–34.0)
MCHC: 32.1 g/dL (ref 32.0–36.0)
MCV: 84 fL (ref 80–100)
Monocyte #: 0.3 x10 3/mm (ref 0.2–0.9)
Monocyte %: 3.2 %
Neutrophil #: 3.8 10*3/uL (ref 1.4–6.5)
Neutrophil %: 49.4 %
Platelet: 204 10*3/uL (ref 150–440)
RBC: 4.45 10*6/uL (ref 3.80–5.20)
RDW: 14.6 % — ABNORMAL HIGH (ref 11.5–14.5)
WBC: 7.8 10*3/uL (ref 3.6–11.0)

## 2014-07-13 LAB — HEPARIN LEVEL (UNFRACTIONATED)
ANTI-XA(UNFRACTIONATED): 0.4 [IU]/mL (ref 0.30–0.70)
Anti-Xa(Unfractionated): 0.28 IU/mL — ABNORMAL LOW (ref 0.30–0.70)
Anti-Xa(Unfractionated): 0.29 IU/mL — ABNORMAL LOW (ref 0.30–0.70)
Anti-Xa(Unfractionated): 0.42 IU/mL (ref 0.30–0.70)

## 2014-07-13 LAB — TSH: Thyroid Stimulating Horm: 1.873 u[IU]/mL

## 2014-07-13 LAB — HEMOGLOBIN A1C: Hemoglobin A1C: 6.3 % — ABNORMAL HIGH

## 2014-07-14 LAB — BASIC METABOLIC PANEL
Anion Gap: 6 — ABNORMAL LOW (ref 7–16)
BUN: 15 mg/dL
CALCIUM: 8.2 mg/dL — AB
Chloride: 104 mmol/L
Co2: 26 mmol/L
Creatinine: 0.76 mg/dL
EGFR (African American): >60
Glucose: 142 mg/dL — ABNORMAL HIGH
Potassium: 3.6 mmol/L
Sodium: 136 mmol/L

## 2014-07-14 LAB — HEPARIN LEVEL (UNFRACTIONATED): Anti-Xa(Unfractionated): 0.43 IU/mL (ref 0.30–0.70)

## 2014-07-14 LAB — CBC WITH DIFFERENTIAL/PLATELET
BASOS ABS: 0.1 10*3/uL (ref 0.0–0.1)
Basophil %: 0.9 %
EOS ABS: 0.3 10*3/uL (ref 0.0–0.7)
Eosinophil %: 3.8 %
HCT: 35.2 % (ref 35.0–47.0)
HGB: 11.6 g/dL — ABNORMAL LOW (ref 12.0–16.0)
LYMPHS PCT: 40.4 %
Lymphocyte #: 3.1 10*3/uL (ref 1.0–3.6)
MCH: 27.6 pg (ref 26.0–34.0)
MCHC: 32.8 g/dL (ref 32.0–36.0)
MCV: 84 fL (ref 80–100)
MONOS PCT: 4.3 %
Monocyte #: 0.3 x10 3/mm (ref 0.2–0.9)
NEUTROS ABS: 3.9 10*3/uL (ref 1.4–6.5)
NEUTROS PCT: 50.6 %
Platelet: 195 10*3/uL (ref 150–440)
RBC: 4.18 10*6/uL (ref 3.80–5.20)
RDW: 14.6 % — AB (ref 11.5–14.5)
WBC: 7.7 10*3/uL (ref 3.6–11.0)

## 2014-07-14 NOTE — Discharge Summary (Signed)
PATIENT NAMSuzie Portela:  [name], [name] MR#:  [id number] DATE OF BIRTH:  1985-07-06  DATE OF ADMISSION:  03/04/2014 DATE OF DISCHARGE:  03/05/2014  ADMITTING PHYSICIAN: Cletis Athensavid K. Hower, MD.    DISCHARGING PHYSICIAN: Enid Baasadhika Jashay Roddy, MD.  [facility].   CONSULTATIONS IN THE HOSPITAL: None.   DISCHARGE DIAGNOSES:  1. Right-sided pleurisy.  2. Acute pulmonary embolism bilaterally in the lower lobe arteries.   3. Anxiety.   DISCHARGE HOME MEDICATIONS:  1. Lovenox 110 mg subcutaneously b.i.d. for 5 days.  2. Bridging with Coumadin 5 mg p.o. daily.  3. Klonopin 1 mg p.o. b.i.d.  4. Norco 5/325 mg one to 2 tablets q.6 hours p.r.n. for pain.   DISCHARGE DIET: Regular diet.   DISCHARGE ACTIVITY: As tolerated.   FOLLOWUP INSTRUCTIONS:  Follow with PCP for PT-INR check in 3 days and adjustment of Coumadin dose.  Once INR is between 2 and 3 discontinue subcutaneous Lovenox.  PCP follow-up in 1 week recommended.   LABORATORY AND IMAGING STUDIES PRIOR TO DISCHARGE:  INR is 1.1. Troponins are negative x3. WBC 4.4, hemoglobin 13.2, hematocrit 42.2, platelet count is 214,000. Sodium 137, potassium 3.6, chloride 107, bicarbonate 22, BUN 11, creatinine 0.82, glucose 114, and calcium of 8.2.  Urinalysis negative for any infection. Urine cultures with mixed organisms.  Chest x-ray on admission showing no acute findings, chronic interstitial lung disease changes. CT of the chest with contrast on admission showing acute pulmonary emboli in the lower lobe branches of pulmonary arteries bilaterally, new ill-defined opacity in the posterior right lower lobe represent focal atelectasis or pulmonary infarction. Stable mild mediastinal adenopathy is noted, which is similar to prior exam.   BRIEF HOSPITAL COURSE: [name] is a 29 year old obese Caucasian female with known history of DVT, not on any anticoagulation, presents to the hospital secondary to right-sided chest pain, noted to have acute  PE.  1. Acute pulmonary embolism.  Hypercoagulable workup was done as an outpatient. The patient has no history of right lower extremity DVT.  For some reason, [facility]. She denies having an IVC filter.  CT chest shows bilateral lower lobe arteries with pulmonary embolism and possible right lower and posterior lobe infarction. She was started on Lovenox and Coumadin per her wishes.  The ewer anticoagulants were also discussed the patient. She said she could get the Lovenox for $4.00 and she [place] in [place] at which place she can get her INRs checked.  So she is being discharged on Coumadin and Lovenox at this time. She has pleuritic chest pain on the right side from the same and is on pain medications as needed.  Now this time, she was strongly advised that she might need lifelong anticoagulation.  2. Anxiety.  Her Klonopin is continued. Her course has been otherwise uneventful in the hospital.   DISCHARGE CONDITION: Stable.   DISCHARGE DISPOSITION: Home.   TIME SPENT ON DISCHARGE: 40 minutes.    ____________________________ Enid Baasadhika Chardae Mulkern, MD rk:by D: 03/05/2014 14:09:40 ET T: 03/05/2014 21:04:27 ET JOB#: [id number]  cc: Enid Baasadhika Indiah Heyden, MD, <Dictator> Enid BaasADHIKA Sharry Beining MD ELECTRONICALLY SIGNED 03/23/2014 15:02

## 2014-07-15 LAB — CBC WITH DIFFERENTIAL/PLATELET
BASOS ABS: 0.1 10*3/uL (ref 0.0–0.1)
Basophil %: 1.2 %
EOS ABS: 0.2 10*3/uL (ref 0.0–0.7)
Eosinophil %: 3.4 %
HCT: 32.5 % — AB (ref 35.0–47.0)
HGB: 10.7 g/dL — ABNORMAL LOW (ref 12.0–16.0)
LYMPHS PCT: 26.1 %
Lymphocyte #: 1.7 10*3/uL (ref 1.0–3.6)
MCH: 27.7 pg (ref 26.0–34.0)
MCHC: 33.1 g/dL (ref 32.0–36.0)
MCV: 84 fL (ref 80–100)
MONOS PCT: 3.7 %
Monocyte #: 0.2 x10 3/mm (ref 0.2–0.9)
NEUTROS PCT: 65.6 %
Neutrophil #: 4.2 10*3/uL (ref 1.4–6.5)
PLATELETS: 166 10*3/uL (ref 150–440)
RBC: 3.87 10*6/uL (ref 3.80–5.20)
RDW: 14.3 % (ref 11.5–14.5)
WBC: 6.4 10*3/uL (ref 3.6–11.0)

## 2014-07-15 LAB — HEPARIN LEVEL (UNFRACTIONATED)
ANTI-XA(UNFRACTIONATED): 0.19 [IU]/mL — AB (ref 0.30–0.70)
Anti-Xa(Unfractionated): 0.12 IU/mL — ABNORMAL LOW (ref 0.30–0.70)

## 2014-07-18 NOTE — Op Note (Signed)
PATIENT NAMEATHALIE, [name] MR#:  [id number] DATE OF BIRTH:  [birth year]  DATE OF PROCEDURE:  07/14/2014  PREOPERATIVE DIAGNOSES:  1.  Extensive right lower extremity deep vein thrombosis.  2.  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   3.  Undetermined hypercoagulable state.  4.  Tobacco dependence.   POSTOPERATIVE DIAGNOSES:  1.  Extensive right lower extremity deep vein thrombosis.  2.  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   3.  Undetermined hypercoagulable state.  4.  Tobacco dependence.   PROCEDURES:  1.  Ultrasound guidance for vascular access to left femoral vein.  2.  Placement of a Cook Celect IVC filter from left femoral venous approach.  3.  Ultrasound guidance for vascular access to right lesser saphenous vein.  4.  Catheter placement to inferior vena cava from right lesser saphenous vein approach.  5.  Right lower extremity venogram and central venogram.  6.  Catheter directed thrombolysis with 8 mg of t-PA with the AngioJet Zelante catheter in the right popliteal vein, superficial femoral vein, common femoral vein, and external iliac vein.  7.  Mechanical rheolytic thrombectomy to same veins.  8.  Percutaneous transluminal angioplasty of right popliteal vein and superficial femoral vein with 8 mm diameter angioplasty balloon.  9.  Percutaneous transluminal angioplasty of right common femoral vein and external iliac vein with 10 mm diameter angioplasty balloon.   SURGEON: Annice Needy, MD   ANESTHESIA: Local with moderate conscious sedation.   BLOOD LOSS: Minimal.   INDICATIONS FOR PROCEDURE: This is a 29 year old female with an extensive right lower extremity DVT that started several days ago. She has a long history of thrombotic issues. She presented due to the severe pain and swelling and a new DVT was seen, which was not present several weeks ago. She has  elected to undergo thrombolysis and thrombectomy for an attempt at improving her postphlebitic symptoms. Given the plan for thrombolysis, an IVC filter will be placed for protection. Risks and benefits were discussed. Informed consent was obtained.   DESCRIPTION OF PROCEDURE: The patient is brought to the vascular suite. Initially, her left groin was sterilely prepped and draped and a sterile surgical field was created. We accessed the left femoral vein without difficulty with a Seldinger needle and a direct ultrasound guidance and a permanent image was recorded. J-wire was placed. This was not traversed particularly easily, so an image was shot through the left femoral sheath, which showed the left iliac vein to be patent and the IVC to be patent. I then advanced the wire and catheter into the IVC, inferior venacavogram was performed, and the filter was deployed at the top of L2 just below the renal veins. Pressure was held and a dressing was placed there. She was then placed in the prone position.   Her right lesser saphenous vein was visualized with ultrasound and found to be patent. It was accessed under ultrasound guidance without difficulty with a micropuncture needle. Micropuncture wire and sheath were placed and we upsized to an 8 [place] sheath. Imaging showed a large collateral feeding around the thigh from the popliteal vein up to the femoral vein. However, the popliteal vein and superficial femoral vein had a large amount of thrombus. I passed a wire and a catheter through the popliteal vein and superficial femoral vein up into the common femoral vein and up into the external iliac vein and inferior vena cava. Intraluminal  flow was confirmed.   I then placed a Magic Torque wire, 8 mg of t-PA were instilled from the popliteal vein up to the superficial femoral vein, the common femoral vein to the external iliac vein where the thrombus stopped. This was done with the AngioJet Zelante catheter and 8 mg  and 100 mL. This was allowed to dwell for approximately 15 minutes and mechanical rheolytic thrombectomy was performed on the same veins with the AngioJet Zelante catheter. Approximately 310 mL of effluent returned on two separate passes of about 200 mL the first time and about 100 mL the second time. In between passes imaging was performed and after passes imaging was performed. There was marked improvement but significant residual thrombus was seen throughout the popliteal vein and superficial femoral vein. I used an 8 mm diameter x 20 cm length angioplasty balloon through these locations. Several areas of significant narrowing were seen, which resolved with angioplasty, and the balloon was deflated. This resulted in about 75 to 80% resolution of thrombus in these locations with significantly improved flow through the venous system.   There was then a large bulky thrombus seen in the common femoral vein and the distal external iliac vein as well as at the superficial femoral vein to common femoral vein confluence. The AngioJet was run there specifically as well, and I used a 10 mm diameter angioplasty balloon from the proximal superficial femoral vein, common femoral vein and up through the external iliac vein. This was a separate and distinct area from the original angioplasty. This resulted in a significant improved flow and again about a 70 to 80% resolution of thrombus. At this point, there was a good channel of blood flow and I elected to terminate the procedure.   The sheath was removed. Pressure was held. Sterile dressings were placed. The patient tolerated the procedure well and was taken to the recovery room in stable condition.    ____________________________ Annice NeedyJason S. Verlon Pischke, MD jsd:AT D: 07/14/2014 16:16:42 ET T: 07/15/2014 01:31:10 ET JOB#: [id number]  cc: Annice NeedyJason S. Dorris Vangorder, MD, <Dictator> Annice NeedyJASON S Tanica Gaige MD ELECTRONICALLY SIGNED 07/16/2014 17:28

## 2014-07-18 NOTE — Consult Note (Signed)
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  She developed worsening pain and swelling far more than her typical postphlebitic symptoms yesterday.  Has long term chronic pain and swelling, but this is new.  Right leg about twice as big as the left.  Pain 10/10.  Elevation not helping.  Wears a stocking chronically.  Has been seen by [facility] and has undetermined hypercoagulable state.  Her [place] on this admission that I have independently reviewed has extensive DVT throughout the visualized veins in the RLE.  This is new from an older study showing only calf vein/popliteal vein DVT.   PAST MEDICAL/SURGICAL HISTORY:  Past Medical History:   Pulmonary Embolus:    kidney stone:    hemrrhoids:    STD:    DVT - Deep Vein Thrombosis:    Anxiety:    Bipolar Disorder:    Herniated Disc:    Hysterectomy - Partial:    Tubal Ligation:   ALLERGIES:  Allergies:  PCN: Rash  Ibuprofen: Rash  Xarelto: Bleeding  HOME MEDICATIONS:  Home Medications: Medication Instructions Status  KlonoPIN 1 mg oral tablet 1 tab(s) orally 2 times a day Active  PriLOSEC OTC 20 mg oral delayed release tablet 2 tab(s) orally once a day Active  Coumadin 5 mg oral tablet 1 tab(s) orally once a day Active  Lovenox 120 mg/0.8 mL injectable solution 120 milligram(s) injectable 2 times a day Active   Family and Social History:  Family History Hypertension  denies clotting issues in family to her knowledge.   Social History positive  tobacco, negative Illicit drugs   + Tobacco Current (within 1 year)   Place of Living Home  difficulty holding jobs due to leg pain and swelling   Review of Systems:  Subjective/Chief Complaint No TIA/stroke/seizure No heat or cold intolerance No  dysuria/hematuria No blurry or double vision No tinnitus or ear pain No rashes or ulcer No suicidal ideation or psychosis No signs of bleeding or easy bruising No SOB/DOE, orthopnea, or sputum No palpitations.  Positive for chest pain with PE previously No N/V/D or abdominal pain LE pain and swelling as above No fever or chills No unintentional weight loss or gain   Medications/Allergies Reviewed Medications/Allergies reviewed   Physical Exam:  GEN well developed, well nourished, no acute distress, obese   HEENT pink conjunctivae, hearing intact to voice   NECK No masses  trachea midline   RESP normal resp effort  no use of accessory muscles   CARD regular rate  LE edema present  no JVD   VASCULAR ACCESS none   ABD denies tenderness  soft   GU no superpubic tenderness   LYMPH negative neck, negative axillae   EXTR negative cyanosis/clubbing, positive edema, minimal LLE edema.  3+ RLE edema.  Pulses palable distally   SKIN normal to palpation, skin turgor good   NEURO cranial nerves intact, follows commands, motor/sensory function intact   PSYCH alert, A+O to time, place, person, good insight   LABS:  Laboratory Results: LabObservation:    25-Apr-16 01:53, [place] Color Flow Doppler Lower Extrem Right (Leg)  OBSERVATION   Reason for Test RIGHT LEG DVT DIAGNOSED 7 DAYS PTA PER PT, HISTORY OF DVT, PAIN TO RIGHT LEG  Routine Chem:    25-Apr-16 02:10,  Basic Metabolic Panel (w/Total Calcium)  Glucose, Serum 126  65-99  NOTE: New Reference Range   05/25/14  BUN 13  6-20  NOTE: New Reference Range   05/25/14  Creatinine (comp) 1.08  0.44-1.00  NOTE: New Reference Range   05/25/14  Sodium, Serum 135  135-145  NOTE: New Reference Range   05/25/14  Potassium, Serum 4.0  3.5-5.1  NOTE: New Reference Range   05/25/14  Chloride, Serum 105  101-111  NOTE: New Reference Range   05/25/14  CO2, Serum 25  22-32  NOTE: New Reference Range   05/25/14  Calcium  (Total), Serum 8.5  8.9-10.3  NOTE: New Reference Range   05/25/14  Anion Gap 5  eGFR (African American) >60  eGFR (Non-African American) >60  eGFR values <60mL/min/1.73 m2 may be an indication of chronic  kidney disease (CKD).  Calculated eGFR is useful in patients with stable renal function.  The eGFR calculation will not be reliable in acutely ill patients  when serum creatinine is changing rapidly. It is not useful in  patients on dialysis. The eGFR calculation may not be applicable  to patients at the low and high extremes of body sizes, pregnant  women, and vegetarians.  Result Comment   - K-HEMOLYSIS AT THIS LEVEL MAY AFFECT THE RESULT   Result(s) reported on 12 Jul 2014 at 05:28AM.  Routine Coag:    25-Apr-16 02:10, Activated PTT  Activated PTT (APTT) 32.8  A HCT value >55% may artifactually increase the APTT. In one study,  the increase was an average of 19%.  Reference: "Effect on Routine and Special Coagulation Testing Values  of Citrate Anticoagulant Adjustment in Patients with High HCT Values."  American Journal of Clinical Pathology 2006;126:400-405.    25-Apr-16 02:10, Prothrombin Time  Prothrombin 16.0  11.4-15.0  NOTE: New Reference Range   04/16/14  INR 1.3  INR reference interval applies to patients on anticoagulant therapy.  A single INR therapeutic range for coumarins is not optimal for all  indications; however, the suggested range for most indications is  2.0 - 3.0.  Exceptions to the INR Reference Range may include: Prosthetic heart  valves, acute myocardial infarction, prevention of myocardial  infarction, and combinations of aspirin and anticoagulant. The need  for a higher or lower target INR must be assessed individually.  Reference: The Pharmacology and Management of the Vitamin K   antagonists: the seventh ACCP Conference on Antithrombotic and  Thrombolytic Therapy. Chest.2004 Sept:126 (3suppl): 2045-2335.  A HCT value >55% may artifactually  increase the PT.  In one study,   the increase was an average of 25%.  Reference:  "Effect on Routine and Special Coagulation Testing Values  of Citrate Anticoagulant Adjustment in Patients with High HCT Values."  American Journal of Clinical Pathology 2006;126:400-405.  Routine Hem:    25-Apr-16 02:10, CBC Profile  WBC (CBC) 12.7  RBC (CBC) 4.87  Hemoglobin (CBC) 13.4  Hematocrit (CBC) 41.2  Platelet Count (CBC) 243  MCV 85  MCH 27.5  MCHC 32.5  RDW 14.6  Neutrophil % 65.7  Lymphocyte % 27.2  Monocyte % 3.3  Eosinophil % 2.2  Basophil % 1.6  Neutrophil # 8.3  Lymphocyte # 3.4  Monocyte # 0.4  Eosinophil # 0.3  Basophil # 0.2  Result(s) reported on 12 Jul 2014 at 02:41AM.   RADIOLOGY:  Radiology Results: XRay:    01-Jun-15 22:06, Chest PA and Lateral  Chest PA and Lateral  REASON FOR EXAM:    CP    COMMENTS:       PROCEDURE: DXR - DXR CHEST PA (OR AP) AND LATERAL  - Aug 17 2013 10:06PM     CLINICAL DATA:  Chest pain.  Recent diagnosis of pulmonary embolism.    EXAM:  CHEST  2 VIEW    COMPARISON:  08/08/2013 chest CT.    FINDINGS:  No evidence of lung infarct, consolidation, edema, effusion, or  pneumothorax. Normal heart size and upper mediastinal contours. No  acute osseous findings.     IMPRESSION:  No edema, consolidation, or lung infarct.      Electronically Signed    By: Jorje Guild M.D.    On: 08/17/2013 22:16         Verified By: Gilford Silvius, M.D.,    21-Nov-15 11:44, Chest PA and Lateral  Chest PA and Lateral  REASON FOR EXAM:    cough  COMMENTS:       PROCEDURE: DXR - DXR CHEST PA (OR AP) AND LATERAL  - Feb 06 2014 11:44AM     CLINICAL DATA:  Chest congestion and current smoker.    EXAM:  CHEST - 2 VIEW    COMPARISON:  08/17/2013    FINDINGS:  The heart size and mediastinal contours are within normal limits.  Mild interstitial prominence likely relates to chronic lung disease  and smoking. There is no evidence of pulmonary  edema, consolidation,  pneumothorax, nodule or pleural fluid. The visualized skeletal  structures are unremarkable.     IMPRESSION:  No acute findings. Probable mild chronic interstitial lung disease  from smoking.      Electronically Signed    By: Aletta Edouard M.D.    On: 02/06/2014 11:50         Verified By: Azzie Roup, M.D.,    16-Dec-15 21:38, Chest PA and Lateral  Chest PA and Lateral  REASON FOR EXAM:    pain  COMMENTS:       PROCEDURE: DXR - DXR CHEST PA (OR AP) AND LATERAL  - Mar 03 2014  9:38PM     CLINICAL DATA:  Right flank and back pain.  Difficulty breathing.    EXAM:  CHEST  2 VIEW    COMPARISON:  02/06/2014    FINDINGS:  Normal heart size and mediastinal contours. No acute infiltrate or  edema. No effusion or pneumothorax. No acute osseous findings.   IMPRESSION:  No active cardiopulmonary disease.      Electronically Signed    By: Jorje Guild M.D.    On: 03/03/2014 21:53         Verified By: Gilford Silvius, M.D.,  [place]:    23-May-15 20:25, [place] Color Flow Doppler Upper Extrem Right (Arm)  [place] Color Flow Doppler Upper Extrem Right (Arm)  REASON FOR EXAM:    Swelling; hx of DVT  COMMENTS:   LMP: Post Hysterectomy    PROCEDURE: [place]  - US DOPPLER UP EXTR RIGHT  - Aug 08 2013  8:25PM     CLINICAL DATA:  Pain and swelling and redness right arm    EXAM:  Right UPPER EXTREMITY VENOUS DOPPLERULTRASOUND    TECHNIQUE:  Gray-scale sonography with graded compression, as well as color  Doppler and duplex ultrasound were performed to evaluate the upper  extremity deep venous system from the level of the subclavian vein  and including the jugular, axillary, basilic, radial, ulnar and  upper cephalic vein. Spectral Doppler was utilized to evaluate flow  at rest and with  distal augmentation maneuvers.    COMPARISON:  None.    FINDINGS:  Internal Jugular Vein: No evidence of thrombus. Normal  compressibility, respiratory phasicity and  response to augmentation.    Subclavian Vein: No evidence of thrombus. Normal compressibility,  respiratory phasicity and response to augmentation.    Axillary Vein: No evidence of thrombus. Normal compressibility,  respiratory phasicity and response to augmentation.  Cephalic Vein: No evidence of thrombus. Normal compressibility,  respiratory phasicity and response to augmentation.    Basilic Vein: Occlusive thrombus is present in the basilic vein. The  vein is noncompressible. There is hypoechoic clot which is likely  acute.    Brachial Veins: No evidence of thrombus. Normal compressibility,  respiratory phasicity and response to augmentation.    Radial Veins: No evidence of thrombus. Normal compressibility,  respiratory phasicity and response to augmentation.    Ulnar Veins: No evidence of thrombus. Normal compressibility,  respiratory phasicity and response to augmentation.  Venous Reflux:  None visualized.    Other Findings:  None visualized.    IMPRESSION:  Occlusive thrombus in the basilic vein, likely acute.      Electronically Signed    By: Charles  Clark M.D.    On: 08/08/2013 20:30         Verified By: DAVID C. CLARK, M.D.,    24-May-15 17:00, US Color Flow Doppler Low Extrem Left (Leg)  US Color Flow Doppler Low Extrem Left (Leg)  REASON FOR EXAM:    dvt evaluation / chronic dvt now with PE.  COMMENTS:       PROCEDURE: US  - US DOPPLER LOW EXTR LEFT  - Aug 09 2013  5:00PM     CLINICAL DATA:  History of PE.    EXAM:  LEFT LOWER EXTREMITY VENOUS DOPPLER ULTRASOUND    TECHNIQUE:  Gray-scale sonography with graded compression, as well as color  Doppler and duplex ultrasound were performed to evaluate the lower  extremity deep venous systems from the level of the common femoral  vein and including the common femoral, femoral, profunda femoral,  popliteal and calf veins including the posterior tibial, peroneal  and gastrocnemius veins when visible. The  superficial great  saphenous vein was also interrogated. Spectral Doppler was utilized  to evaluate flow at rest and with distal augmentation maneuvers in  the common femoral, femoral and popliteal veins.    COMPARISON:  None.    FINDINGS:  The left common femoral, greater saphenous, femoral, popliteal  veins, and venous trifurcation are patent, demonstrating normal  color-flow and compressibility. No intraluminal thrombus is  identified.There is normal respiratory variation and augmentation  demonstrated at all vein levels.   IMPRESSION:  No evidence for DVT within the left lower extremity.      Electronically Signed    By: Drew  Davis M.D.    On: 08/09/2013 17:39         Verified By: DREW E. DAVIS, M.D.,    20-Jun-15 14:16, US Color Flow Doppler Upper Extrem Right (Arm)  US Color Flow Doppler Upper Extrem Right (Arm)  REASON FOR EXAM:    Increased pain, hx of DVT to RUE  COMMENTS:       PROCEDURE: US  - US DOPPLER UP EXTR RIGHT  - Sep 05 2013  2:16PM     CLINICAL DATA:  Increased pain, hx of DVT to RUE    EXAM:  Right UPPER EXTREMITY VENOUS DOPPLER ULTRASOUND    TECHNIQUE:  Gray-scale sonography with graded compression, as well   as color  Doppler and duplex ultrasound were performed to evaluate the upper  extremity deep venous system from the level of the subclavian vein  and including the jugular, axillary, basilic, radial, ulnar and  upper cephalic vein. Spectral Doppler was utilized to evaluate flow  at rest and with distal augmentation maneuvers.    COMPARISON:  Right upper extremity Doppler dated 08/08/2013    FINDINGS:  Internal Jugular Vein: No evidence of thrombus. Normal  compressibility, respiratory phasicity and response to augmentation.    Subclavian Vein: No evidence of thrombus. Normal compressibility,  respiratory phasicity and response to augmentation.    Axillary Vein: No evidence of thrombus. Normal compressibility,  respiratory phasicity and  response to augmentation.  Cephalic Vein: No evidence of thrombus. Normal compressibility,  respiratory phasicity and response to augmentation.    Basilic Vein: With proximal partial occlusionand complete distal  occlusion at the level of the antecubital fossa.    Brachial Veins: No evidence of thrombus. Normal compressibility,  respiratory phasicity and response to augmentation.    Radial Veins: No evidence of thrombus. Normal compressibility,  respiratory phasicity and response to augmentation.    Ulnar Veins: No evidence of thrombus. Normal compressibility,  respiratory phasicity and response to augmentation.  Venous Reflux:  None visualized.    Other Findings:  None visualized.     IMPRESSION:  Basilic vein thrombus with proximal partial occlusion and distal  complete occlusion. This finding appears stable when compared to  previous study. Otherwise no further evidence of deep venous  thrombosis.      Electronically Signed    By: Hector  Cooper M.D.    On: 09/05/2013 15:05     Verified By: HECTOR W. COOPER, M.D., MD    06-Jul-15 00:43, US Color Flow Doppler Upper Extrem Left (Arm)  US Color Flow Doppler Upper Extrem Left (Arm)  REASON FOR EXAM:    left arm pain, swelling, h/o DVT/PE  COMMENTS:       PROCEDURE: US  - US DOPPLER UP EXTR LEFT  - Sep 21 2013 12:43AM     CLINICAL DATA:  Left arm pain and swelling. Edema. Anticoagulation  therapy. Previous DVT and PE. Tobacco use. Color changes.    EXAM:  Left UPPER EXTREMITY VENOUS DOPPLER ULTRASOUND    TECHNIQUE:  Gray-scale sonography with graded compression, as well as color  Doppler and duplex ultrasound were performed to evaluate the upper  extremity deep venous system from the level of the subclavian vein  and including the jugular, axillary, basilic, radial, ulnar and  upper cephalic vein. Spectral Doppler was utilized to evaluate flow  at rest and with distal augmentation maneuvers.    COMPARISON:   None.    FINDINGS:  Internal Jugular Vein: No evidence of thrombus. Normal  compressibility, respiratory phasicity and response to augmentation.    Subclavian Vein: No evidence of thrombus. Normal compressibility,  respiratory phasicity and response to augmentation.    Axillary Vein: No evidence of thrombus. Normal compressibility,  respiratory phasicity and response to augmentation.  Cephalic Vein: No evidence of thrombus. Normal compressibility,  respiratory phasicity and response to augmentation.    Basilic Vein: No evidence of thrombus. Normal compressibility,  respiratory phasicity and response to augmentation.    Brachial Veins: No evidence of thrombus. Normal compressibility,  respiratory phasicity and response to augmentation.    Radial Veins: No evidence of thrombus. Normal compressibility,  respiratory phasicity and response to augmentation.    Ulnar Veins: No evidence of thrombus. Normal   compressibility,  respiratory phasicity and response to augmentation.  Venous Reflux:  None visualized.    Other Findings: Area of redness with probable cord-like region is on  the ulnar side of the forearm. A small peripheral vein in this area  does demonstrate echogenic filling defect without compressibility or  flow consistent with thrombus.     IMPRESSION:  No evidence of deep venous thrombosis. There is evidence of  superficial venous thrombosis, probably in a superficial branch of  the basilic vein, corresponding to area of palpable redness and  swelling.      Electronically Signed    By: William  Stevens M.D.    On: 09/21/2013 00:52         Verified By: WILLIAM R. STEVENS, M.D.,    18-Aug-15 22:22, US Kidney Bilateral  US Kidney Bilateral  REASON FOR EXAM:    right sided flank pain, hx of kidney stones  COMMENTS:       PROCEDURE: US  - US KIDNEY  - Nov 03 2013 10:22PM     CLINICAL DATA:  Right flank pain.  History of kidney stones.    EXAM:  RENAL/URINARY  TRACT ULTRASOUND COMPLETE    COMPARISON:  07/18/2013 abdominal CT    FINDINGS:  Right Kidney:  Length: 12 cm. Echogenicity within normal limits. No mass or  hydronephrosis visualized.    Left Kidney:    Length: 12 cm. Echogenicity within normal limits. No mass or  hydronephrosis visualized.    Bladder:    Limited evaluation due to recent voiding.    Imaged portions of the right liver appear dense, possible steatosis.     IMPRESSION:  No hydronephrosis or other finding to explain of flank pain.      Electronically Signed    By:Jonathan  Watts M.D.    On: 11/03/2013 22:59         Verified By: JONATHAN M. WATTS, M.D.,    17-Sep-15 19:54, US Color Flow Doppler Lower Extrem Right (Leg)  US Color Flow Doppler Lower Extrem Right (Leg)  REASON FOR EXAM:    History of DVT, pain and swelling RLE  COMMENTS:       PROCEDURE: US  - US DOPPLER LOW EXTR RIGHT  - Dec 03 2013  7:54PM     CLINICAL DATA:  History of DVT. Right lower extremity pain and  swelling. History of nonocclusive popliteal vein thrombus.    EXAM:  RIGHT LOWER EXTREMITY VENOUS DOPPLER ULTRASOUND    TECHNIQUE:  Gray-scale sonography with graded compression, as well as color  Doppler and duplex ultrasound were performed to evaluate the lower  extremity deep venous systems from the level of the common femoral  vein and including the common femoral, femoral, profunda femoral,  popliteal and calf veins including the posterior tibial, peroneal  and gastrocnemius veins when visible. The superficial great  saphenous vein was also interrogated. Spectral Doppler was utilized  to evaluate flow at rest and with distal augmentation maneuvers in  the common femoral, femoral and popliteal veins.    COMPARISON:  None.    FINDINGS:  Common Femoral Vein: No evidence of thrombus. Normal  compressibility, respiratory phasicity and response to augmentation.    Saphenofemoral Junction: No evidence of thrombus.  Normal  compressibility and flow on color Doppler imaging.  Profunda Femoral Vein: No evidence of thrombus. Normal  compressibility and flow on color Doppler imaging.    Femoral Vein: No evidence of thrombus. Normal compressibility,  respiratory phasicity and response to augmentation.      Popliteal Vein: There is linear thrombus within the popliteal vein.  This is nonocclusive. The vein is noncompressible.    Calf Veins: No evidence of thrombus. Normal compressibility and flow  on color Doppler imaging.    Superficial Great Saphenous Vein: No evidence of thrombus. Normal  compressibility and flow on color Doppler imaging.  Venous Reflux:  None.    Other Findings:  None.     IMPRESSION:  Persistent linear thrombus within the popliteal vein which is  nonocclusive. Findings are consistent with sequelae of prior  thrombus and stable in appearance over prior studies.      Electronically Signed    By: Beth  Brown M.D.    On: 12/03/2013 21:01       Verified By: ELIZABETH D. BROWN, M.D.,    25-Oct-15 13:11, US Color Flow Doppler Upper Extrem Right (Arm)  US Color Flow Doppler Upper Extrem Right (Arm)  REASON FOR EXAM:    pain, swelling, hx dvt  COMMENTS:       PROCEDURE: US  - US DOPPLER UP EXTR RIGHT  - Jan 10 2014  1:11PM     CLINICAL DATA:  Acute right arm pain and swelling. History of right  basilic vein superficial thrombosis.    EXAM:  RIGHTUPPER EXTREMITY VENOUS DOPPLER ULTRASOUND    TECHNIQUE:  Gray-scale sonography with graded compression, as well as color  Doppler and duplex ultrasound were performed to evaluate the upper  extremity deep venous system from the level of the subclavian vein  and including the jugular, axillary, basilic, radial, ulnar and  upper cephalic vein. Spectral Doppler was utilized to evaluate flow  at rest and with distal augmentation maneuvers.    COMPARISON:  09/05/2013    FINDINGS:  Internal Jugular Vein: No evidence of thrombus.  Normal  compressibility, respiratory phasicity and response to augmentation.    Subclavian Vein: No evidence of thrombus. Normal compressibility,  respiratory phasicity and response to augmentation.    Axillary Vein: No evidence of thrombus. Normal compressibility,  respiratory phasicity and response to augmentation.  Cephalic Vein: Acute occlusive thrombus present in the right  cephalic vein beginning in the antecubital fossa and extending into  the forearm compatible with cephalic superficial  thrombosis/thrombophlebitis.    Basilic Vein: No evidence of thrombus. Normal compressibility,  respiratory phasicity and response to augmentation.    Brachial Veins: No evidence of thrombus. Normal compressibility,  respiratory phasicity and response to augmentation.    Radial Veins: No evidence of thrombus. Normal compressibility,  respiratory phasicity and response to augmentation.    Ulnar Veins: No evidence of thrombus. Normal compressibility,  respiratory phasicityand response to augmentation.    Venous Reflux:  None visualized.    Other Findings:  None visualized.     IMPRESSION:  Acute right cephalic vein superficial thrombosis/ thrombophlebitis.    Otherwise negative for DVT.      Electronically Signed    By: Trevor  Shick M.D.    On: 01/10/2014 13:32     Verified By: MICHAEL T. SHICK, M.D.,    13-Jan-16 21:07, US Color Flow Doppler Lower Extrem Right (Leg)  US Color Flow Doppler Lower Extrem Right (Leg)  REASON FOR EXAM:    Edema  COMMENTS:       PROCEDURE: US  - US DOPPLER LOW EXTR RIGHT  - Mar 31 2014  9:07PM     CLINICAL DATA:  Right lower extremity edema.  History of prior DVT.    EXAM:  RIGHT LOWER EXTREMITY VENOUS DOPPLER   ULTRASOUND    TECHNIQUE:  Gray-scale sonography with graded compression, as well as color  Doppler and duplex ultrasound were performed to evaluate the lower  extremity deep venous systems from the level of the common femoral  vein and  including the common femoral, femoral, profunda femoral,  popliteal and calf veins including the posterior tibial, peroneal  and gastrocnemius veins when visible. The superficial great  saphenous vein was also interrogated. Spectral Doppler was utilized  to evaluate flow at rest and withdistal augmentation maneuvers in  the common femoral, femoral and popliteal veins.    COMPARISON:  Right lower extremity duplex 12/03/2013, 06/21/2012    FINDINGS:  Contralateral Common Femoral Vein: Respiratory phasicity is normal  and symmetric with the symptomatic side. No evidence of thrombus.  Normal compressibility.    Common Femoral Vein: No evidence of thrombus. Normal compressibility  and respiratory phasicity.  Saphenofemoral Junction: No evidence of thrombus. Normal  compressibility and flow on color Doppler imaging.    Profunda Femoral Vein: No evidence of thrombus. Normal  compressibility and flow on color Doppler imaging.    Femoral Vein: No evidence of thrombus. Normal compressibility and  respiratory phasicity.    Popliteal Vein: Linear thrombus within the popliteal vein that is  nonocclusive is unchanged compared to prior exams. No new thrombus.    Calf Veins: Peroneal vein is not well seen. No evidence of posterior  tibial thrombus. Normal compressibility and flow on color Doppler  imaging.  Superficial Great Saphenous Vein: No evidence of thrombus. Normal  compressibility and flow on color Doppler imaging.    Venous Reflux:  None.    Other Findings:  None.     IMPRESSION:  Persistent linear thrombus in the poplitealvein that is  nonocclusive, chronic and unchanged over the course of prior exams.  Remaining deep veins are patent without new deep vein thrombosis.      Electronically Signed    By: Jeb Levering M.D.    On: 03/31/2014 21:17         Verified By: Rollene Fare. EHINGER, M.D.,    22-Mar-16 20:50, [place] Color Flow Doppler Lower Extrem Right (Leg)  [place] Color  Flow Doppler Lower Extrem Right (Leg)  REASON FOR EXAM:    pain swelling  COMMENTS:   LMP: Post Hysterectomy    PROCEDURE: [place]  - US DOPPLER LOW EXTR RIGHT  - Jun 08 2014  8:50PM     CLINICAL DATA:  Right lower extremity pain and swelling for 3 days.  History of chronic DVT.    EXAM:  RIGHT LOWER EXTREMITY VENOUS DOPPLER ULTRASOUND    TECHNIQUE:  Gray-scale sonography with graded compression, as well as color  Doppler and duplex ultrasound were performed to evaluate the lower  extremity deep venous systems from the level of the common femoral  vein and including the common femoral, femoral, profunda femoral,  popliteal and calf veins including the posterior tibial, peroneal  and gastrocnemius veins when visible. The superficial great  saphenous vein was also interrogated. Spectral Doppler was utilized  to evaluate flow at rest and with distal augmentation maneuvers in  the common femoral, femoral and popliteal veins.    COMPARISON:  Lower extremity venous Doppler 03/31/2014, 12/03/2013    FINDINGS:  Contralateral Common FemoralVein: Respiratory phasicity is normal  and symmetric with the symptomatic side. No evidence of thrombus.  Normal compressibility.    Common Femoral Vein: No evidence of thrombus. Normal  compressibility, respiratory phasicity and response to augmentation.  Saphenofemoral Junction: No evidence of thrombus. Normal  compressibility and flow on color Doppler imaging.    Profunda Femoral Vein: No evidence of thrombus. Normal  compressibility and flow on color Doppler imaging.    Femoral Vein: No evidence of thrombus. Normal compressibility,  respiratory phasicity and response to augmentation.    Popliteal Vein: There is unchanged nonocclusive thrombus the hand  lack of augmentation. This is unchanged in appearance in extent  compared to prior exam.  Calf Veins: No evidence of thrombus. Normal compressibility and flow  on color Doppler  imaging.    Superficial Great Saphenous Vein: No evidence of thrombus. Normal  compressibility and flow on color Doppler imaging.    Venous Reflux:  None.    Other Findings:  None.     IMPRESSION:  Chronic nonocclusive thrombus in the popliteal vein that is  unchanged in appearance over the course of prior exams. No new or  progressive DVT.  Electronically Signed    By: Jeb Levering M.D.    On: 06/08/2014 21:06         Verified By: Rollene Fare. Marisue Humble, M.D.,    25-Apr-16 01:53, [place] Color Flow Doppler Lower Extrem Right (Leg)  [place] Color Flow Doppler Lower Extrem Right (Leg)  REASON FOR EXAM:    RIGHT LEG DVT DIAGNOSED 7 DAYS PTA PER PT, HISTORY OF   DVT, PAIN TO RIGHT LEG  COMMENTS:       PROCEDURE: [place]  - US DOPPLER LOW EXTR RIGHT  - Jul 12 2014  1:53AM     CLINICAL DATA:  29 year old female with recent diagnosis of right  lower extremity DVT and progressive right lower extremity pain.    EXAM:  RIGHT LOWER EXTREMITY VENOUS DOPPLER ULTRASOUND    TECHNIQUE:  Gray-scale sonography with graded compression, as well as color  Doppler and duplex ultrasound were performed to evaluate the lower  extremity deep venous systems from the level of the common femoral  vein and including the common femoral, femoral, profunda femoral,  popliteal and calf veins including the posterior tibial, peroneal  and gastrocnemius veins when visible. The superficial great  saphenous vein was also interrogated. Spectral Doppler was utilized  to evaluate flow at rest and with distal augmentation maneuvers in  the common femoral, femoral and popliteal veins.    COMPARISON:  None.    FINDINGS:  Contralateral Common Femoral Vein: Respiratory phasicity is normal  and symmetric with the symptomatic side. No evidence of thrombus.  Normal compressibility.  Common Femoral Vein: Expanded and incompressible. The lumen is  filled with low-level internal echoes. Findings consistent with  acute  thrombus.    Saphenofemoral Junction: No evidence of thrombus. Normal  compressibility and flow on color Doppler imaging.    Profunda Femoral Vein: Occlusive thrombus extends into the profunda  femoral vein.    Femoral Vein: Occlusive thrombus extends throughout the femoral vein  in the thigh.    Popliteal Vein: Subocclusive thrombus extends throughout the  popliteal vein. Small saphenous vein remains patent.  Calf Veins: Calf veins are not well seen.    Superficial Great Saphenous Vein: No evidence of thrombus. Normal  compressibility and flow on color Doppler imaging.    Venous Reflux:  None.    Other Findings:  None.     IMPRESSION:  Positive for acute occlusive thrombus extending from the common  femoral vein through the popliteal vein. The profunda femoral vein  is also thrombosed.    The calf vessels are not well  seen secondary to the limitations  related to patient body habitus.      Electronically Signed    By: Jacqulynn Cadet M.D.    On: 07/12/2014 03:15         Verified By: Criselda Peaches, M.D.,  [place]:    02-May-15 13:01, CT Abdomen and Pelvis With Contrast  PACS Image    23-May-15 20:25, [place] Color Flow Doppler Upper Extrem Right (Arm)  PACS Image    23-May-15 22:42, CT [facility] Chest with for PE  PACS Image    24-May-15 17:00, [place] Color Flow Doppler Low Extrem Left (Leg)  PACS Image    01-Jun-15 22:06, Chest PA and Lateral  PACS Image    02-Jun-15 01:07, CT [facility] Chest with for PE  PACS Image    20-Jun-15 14:16, [place] Color Flow Doppler Upper Extrem Right (Arm)  PACS Image    06-Jul-15 00:43, [place] Color Flow Doppler Upper Extrem Left (Arm)  PACS Image    18-Aug-15 22:22, US Kidney Bilateral  PACS Image    17-Sep-15 19:54, [place] Color Flow Doppler Lower Extrem Right (Leg)  PACS Image    25-Oct-15 13:11, [place] Color Flow Doppler Upper Extrem Right (Arm)  PACS Image    25-Oct-15 14:22, CT Abdomen and Pelvis With Contrast  PACS Image     21-Nov-15 11:44, Chest PA and Lateral  PACS Image    16-Dec-15 21:38, Chest PA and Lateral  PACS Image    16-Dec-15 23:12, CT ANGIOGRAPHY Chest with for PE  PACS Image    22-Dec-15 22:16, CT ANGIOGRAPHY Chest with for PE  PACS Image    13-Jan-16 21:07, [place] Color Flow Doppler Lower Extrem Right (Leg)  PACS Image    13-Jan-16 22:38, CT ANGIOGRAPHY Chest with for PE  PACS Image    23-Feb-16 12:08, CT Angiography Chest  PACS Image    22-Mar-16 20:50, [place] Color Flow Doppler Lower Extrem Right (Leg)  PACS Image    22-Mar-16 21:30, CT Head Without Contrast  PACS Image    25-Apr-16 01:53, [place] Color Flow Doppler Lower Extrem Right (Leg)  PACS Image  CT:    02-May-15 13:01, CT Abdomen and Pelvis With Contrast  CT Abdomen and Pelvis With Contrast  REASON FOR EXAM:    (1) abd pain; (2) pel pain  COMMENTS:       PROCEDURE: CT  - CT ABDOMEN / PELVIS  W  - Jul 18 2013  1:01PM     CLINICAL DATA:  29 year old female with abdominal and pelvic pain.  History of laparoscopic hysterectomy on 07/14/2013.    EXAM:  CT ABDOMEN AND PELVIS WITH CONTRAST    TECHNIQUE:  Multidetector CT imaging of the abdomen and pelvis was performed  using the standard protocol following bolus administration of  intravenous contrast.  CONTRAST:  125 cc intravenous Isovue 370    COMPARISON:  10/25/2011 and prior CTs    FINDINGS:  Mild fatty infiltration of the liver is again noted.    Upper limits of normal spleen size again identified.    The gallbladder, adrenal glands, pancreas and kidneys are  unremarkable.    A trace amount of free pelvic fluid is likely physiologic or  postsurgical.  Patient is status post hysterectomy.    The bowel, bladder and appendix are unremarkable. There is no  evidence of bowel obstruction, abscess or pneumoperitoneum.    The adnexal regions are unremarkable.    There is no evidence biliary dilatation, abdominal aortic aneurysm  or enlarged  lymph nodes.    No acute or  suspicious bony abnormalities are identified.     IMPRESSION:  Status post hysterectomy with trace amount of free pelvic fluid  which is likely physiologic or postsurgical. No evidence of  pneumoperitoneum, abscess or ureteral injury.    Mild fatty infiltration of the liver and upper limits of normal  spleen size again noted.      Electronically Signed    By: Hassan Rowan M.D.    On: 07/18/2013 13:21         Verified By: Lura Em, M.D.,    25-Oct-15 14:22, CT Abdomen and Pelvis With Contrast  CT Abdomen and Pelvis With Contrast  REASON FOR EXAM:    (1) rlq pain; (2) rlq pain  COMMENTS:       PROCEDURE: CT  - CT ABDOMEN / PELVIS  W  - Jan 10 2014  2:22PM     CLINICAL DATA:  Right lower quadrant pain for 1 day. Normal white  blood cell count. History of partial hysterectomy.    EXAM:  CT ABDOMEN AND PELVIS WITH CONTRAST    TECHNIQUE:  Multidetector CT imaging of the abdomen and pelvis was performed  using the standard protocol following bolus administration of  intravenous contrast.  CONTRAST:  100 mL of Isovue 370 intravenous contrast.    COMPARISON:  07/18/2013.    FINDINGS:  There are inflammatory changes posterior to the cecum and lower  ascending colon. This lies above a normal-sized appendix which curls  a the retrocecal position. There are small diverticula along with  the cecum and lower ascending colon. Findings support right colon  diverticulitis. There is no extraluminal air or abscess.    There are scattered left colon diverticula. No other evidence of  diverticulitis. No other colonic abnormality. Normal small bowel.    Clear lung bases.  Heart is normal in size.  Liver, spleen, gallbladder, pancreas, adrenal glands, kidneys,  ureters, bladder: Normal.    Uterus is surgically absent.  No pelvic masses.    No pathologically enlarged lymph nodes.  Noascites.    No bony abnormality.     IMPRESSION:  1. There is a normal-sized retrocecal appendix.  Above this,  inflammatory type stranding is seen in the fat posterior to the  cecum and lower ascending colon. There are several diverticula in  this location. Findings support right colon diverticulitis,  uncomplicated. There is no extraluminal air or abscess.  2. No other evidence of an acute abnormality.  3. Scattered left colon diverticula. No other areas of  diverticulitis. Status post hysterectomy. No other abnormalities.      Electronically Signed    By: Lajean Manes M.D.    On: 01/10/2014 14:32         Verified By: Lasandra Beech, M.D.,    23-Feb-16 12:08, CT Angiography Chest  CT Angiography Chest  REASON FOR EXAM:    Hx of DVT's, chest pain with RLE pain  COMMENTS:   May transport without cardiac monitor    PROCEDURE: CT  - CT ANGIOGRAPHY CHEST W/CONTRAST  - May 11 2014 12:08PM     CLINICAL DATA:  Acute chest pain and right lower extremity pain.  History of DVT and pulmonary embolism.    EXAM:  CT ANGIOGRAPHY CHEST WITH CONTRAST    TECHNIQUE:  Multidetector CT imaging of the chest was performed using the  standard protocol during bolus administration of intravenous  contrast. Multiplanar CT image reconstructions and MIPs were  obtained to evaluate the vascular anatomy.    CONTRAST:  100 mL Omnipaque 350 IV    COMPARISON:  03/31/2014 as well as multiple prior CTA studies in  2015.    FINDINGS:  The pulmonary arteries are well opacified.There is no evidence of  acute pulmonary embolism. No residual chronic thrombus is identified  from previous episode of pulmonary embolism in both lower lobes.  Central pulmonary arteries are of normal caliber. The thoracic aorta  is normal.    Lungsshow no evidence of edema, infiltrate or pneumothorax. No  nodules or enlarged lymph nodes. No airway obstruction. Small stable  pneumatocele measuring 7 mm in the right lower lobe. The heart size  is normal. No pleural or pericardial fluid is identified.    Visualized upper  abdomen shows probable steatosis of the liver. Bony  structures are within normal limits.    Review of the MIP images confirms the above findings.     IMPRESSION:  No evidence of acute or chronic pulmonary embolism by CTA. Noacute  findings in the chest.      Electronically Signed    By: Aletta Edouard M.D.    On: 05/11/2014 12:17         Verified By: Azzie Roup, M.D.,    22-Mar-16 21:30, CT Head Without Contrast  CT Head Without Contrast  REASON FOR EXAM:    ha l side of head  COMMENTS:       PROCEDURE: CT  - CT HEAD WITHOUT CONTRAST  - Jun 08 2014  9:30PM     CLINICAL DATA:  Headache for 2 days.    EXAM:  CT HEAD WITHOUT CONTRAST    TECHNIQUE:  Contiguous axial images were obtained from the base of the skull  through the vertex without intravenous contrast.    COMPARISON:  Head CT 04/23/2010  FINDINGS:  No intracranial hemorrhage, mass effect, or midline shift. No  hydrocephalus. The basilar cisterns are patent. No evidence of  territorial infarct. No intracranial fluid collection. Calvarium is  intact. Included paranasal sinuses and mastoid air cells are well  aerated.     IMPRESSION:  No acute intracranial abnormality.      Electronically Signed    By: Jeb Levering M.D.   On: 06/08/2014 21:41     Verified By: Rollene Fare. Marisue Humble, M.D.,   ASSESSMENT AND PLAN:  Assessment/Admission Diagnosis 1. 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 unknown hypercoagulable state, causing number 1 3. tobacco dependence, increasing risk of clots 4. right leg pain and swelling from number one 5. chronic post-phlebitic symptoms already   Plan discussed options.  Difficult situation.  Needs to continue anticoagulation and agree with hematology evaluation as to what to use Discussed thrombolysis and thrombectomy for her extensive new DVT.  Her [place] on this admission that I have independently reviewed has extensive DVT throughout the visualized veins  in the RLE.  This is new from an older study showing only calf vein/popliteal vein DVT. This would likely reduce her pain and swelling now and in future.  Would place filter as well.  Risks of bleeding discussed.  She wants to proceed.  Has been scheduled for Wednesday.    level 4 consult   Electronic Signatures: Algernon Huxley (MD)  (Signed 25-Apr-16 16:53)  Authored: Chief Complaint and History, PAST MEDICAL/SURGICAL HISTORY, ALLERGIES, HOME MEDICATIONS, Family and Social History, Review of Systems, Physical Exam, LABS, RADIOLOGY, Assessment and Plan   Last Updated: 25-Apr-16 16:53 by  Algernon Huxley (MD)

## 2014-07-18 NOTE — Discharge Summary (Addendum)
PATIENT NAMETRENYCE, [name] MR#:  [id number] DATE OF BIRTH:  09-Feb-1986  DATE OF ADMISSION:  07/12/2014 DATE OF DISCHARGE:  07/15/2014  ADMITTING COMPLAINT:  Right leg pain and swelling.   DISCHARGE DIAGNOSES:  1.  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  2.  Obesity.  3.  Ongoing tobacco abuse.  4.  Hypercoagulable state, not otherwise specified.    5.  Hyperlipidemia and hypertriglyceridemia.    CONSULTATIONS:  1.  Annice Needy, MD, vascular surgery.  2.  Tollie Pizza. Orlie Dakin, MD, hematology/oncology.   PROCEDURES:  1.  Thrombectomy of extensive right lower extremity deep vein thrombosis by Dr. Wyn Quaker on 07/14/2014.   2.  Doppler ultrasound of the right leg 07/12/2014 showing acute occlusive thrombus extending from the common femoral vein through the popliteal vein.  The profunda femoral vein is also thrombosed.   HISTORY OF PRESENT ILLNESS:  This 29 year old Caucasian female with past medical history of recurrent DVTs and PEs presents with right lower extremity pain.  She reports that she has had sporadic chest pain not specifically associated with this illness.  Evaluation in Emergency Room showed nearly occlusive thrombus of the right lower extremity veins.  She was admitted for further evaluation and treatment of DVT.    HOSPITAL COURSE BY PROBLEMS:   1.  Recurrent DVTs and PEs with new right lower extremity DVT:  The patient has had a full hypercoagulable work-up both at [facility] and at the [facility].  Dr. Gerarda Fraction was consulted for further recommendations on evaluation and treatment of her hypercoagulable state.  It seems that she is not compliant with medication.  Her INR is normal on presentation and she has not been taking the Lovenox prescribed stating that to [name] pharmacy stopped refilling for her.  At the time of discharge, it was determined that she is not a good candidate for Coumadin therapy as she is unlikely to be  compliant with monitoring of her INR.  She was unable to afford any of the new anticoagulant medications.  She is being discharged with a prescription for Lovenox and will need to have this filled at the [facility] pharmacy.  Our case managers have contacted the pharmacy and her prescription is available for her upon discharge. At this point, no further hypercoagulable workup is planned.  She will follow up with Dr. Orlie Dakin in 3 weeks.  Regarding the new right lower extremity DVT, she had thrombectomy, which decreased her swelling and pain considerably.  The procedure was performed without complication and she will follow up with Dr. Wyn Quaker on 08/27/2014.   2.  Ongoing tobacco abuse:  The patient was counseled extensively regarding tobacco abuse and the contribution to blood clotting.  She does not seem interested in quitting.   3.  Obesity:  The patient was counseled regarding her BMI of 40.9 regarding diet and lifestyle modification.  She does not seem prepared to make these changes.   DISCHARGE PHYSICAL EXAMINATION:  VITAL SIGNS:  Temperature 98.1, pulse 85, respirations 20, blood pressure 103/69, oxygenation 94% on room air.  GENERAL:  No acute distress.  RESPIRATORY:  Lungs are clear to auscultation bilaterally with good air movement.  CARDIOVASCULAR:  Regular rate and rhythm.  No murmurs, rubs, or gallops.   EXTREMITIES:  Her right leg is wrapped to prevent swelling but swelling is considerably decreased from prior examinations.  Left leg, no edema.  Pulses 2+ bilaterally.  PSYCHIATRIC:  She is  alert and oriented x 4 with good insight into her clinical condition.  She seems fairly agitated at the time of discharge.   LABORATORY DATA:  Sodium 136, potassium 3.6, chloride 104, bicarbonate 26, BUN 15, creatinine 0.76, glucose 142, hemoglobin A1c is 6.3.  LDL and total cholesterol were not able to be assessed with our blood test due to hypertriglyceridemia at 500, HDL is 21.  White blood cells 6.4, hemoglobin  10.7, platelets 166,000, MCV is 84.  TSH 1.87.    DISCHARGE MEDICATIONS:  1.  Klonopin 1 mg 1 tablet twice a day.  2.  Prilosec 20 mg 1 tablet twice a day.  3.  Lovenox 120 mg in 0.8 mL injectable twice a day.  4.  Fish oil 1 capsule 2 times a day.   CONDITION ON DISCHARGE: Stable.   DISPOSITION:  She is being discharged home with no further home health needs.   DIET:  Regular diet.   ACTIVITY:  No limitations.   TIMEFRAME FOR FOLLOWUP:  Please follow up with Dr. Wyn Quakerew in 4 to 6 weeks and with Dr. Orlie DakinFinnegan in 2 to 4 weeks.  Please follow up in 1 to 2 weeks with your primary care physician, Dr. Mariana Kaufmanobin.    TIME SPENT ON DISCHARGE:  40 minutes.     ____________________________ Ena Dawleyatherine P. Clent RidgesWalsh, MD cpw:NT D: 07/16/2014 15:42:32 ET T: 07/16/2014 16:16:23 ET JOB#: [id number]  cc: Santina Evansatherine P. Clent RidgesWalsh, MD, <Dictator> Vic RipperPaul C. Mariana Kaufmanobin, MD Gale JourneyATHERINE P Lateisha Thurlow MD ELECTRONICALLY SIGNED 07/17/2014 13:00

## 2014-07-18 NOTE — H&P (Signed)
PATIENT NAMEMARIS, [name] MR#:  [id number] DATE OF BIRTH:  01-Oct-1985  DATE OF ADMISSION:  07/12/2014.  REFERRING PHYSICIAN:  Dr. Loleta Rose.    PRIMARY CARE PHYSICIAN:   Dr. Ruffin Pyo.  ADMISSION DIAGNOSIS:  Acute occlusive thrombus of the right femoral vein.   HISTORY OF PRESENT ILLNESS:  This is a 29 year old Caucasian female who presented to the Emergency Department complaining of right lower extremity pain.  The patient reports that she has had some sporadic chest pain but that was not specifically associated with this illness. She admits that her leg has become more swollen and tender to touch over the last few days.  Her chest pain does not coincide with when her leg hurts, but she has a history of DVT and PE which concerned her enough to come to the hospital.  She states that her chest pain does not feel like it did when she has had pulmonary emboli in the past.  Review of the patient's past medical history reveals that she has a clotting disorder of some sort but when asked, she says, "I have never been told that I have a clotting disorder."  Evaluation in the Emergency Department revealed a nearly totally occlusive thrombus of her right lower extremity veins which prompted the Emergency Department to call for admission.   REVIEW OF SYSTEMS:  CONSTITUTIONAL:  The patient denies fever or weakness.  EYES:  Denies blurry vision or inflammation.  EARS, NOSE AND THROAT:  Denies tinnitus or sore throat.  RESPIRATORY:  Denies cough or shortness of breath.  CARDIOVASCULAR:  Denies chest pain at this time as well as palpitations, orthopnea, or paroxysmal nocturnal dyspnea.  GASTROINTESTINAL:  Denies nausea, vomiting, diarrhea, or abdominal pain.  GENITOURINARY:  Denies dysuria or increased frequency or hesitancy of urination.  ENDOCRINE:  Denies polyuria or polydipsia.  HEMATOLOGIC AND LYMPHATIC:  Denies easy bruising or bleeding.  INTEGUMENTARY:  Denies rashes or lesions.   MUSCULOSKELETAL:  Denies arthralgias or myalgias.  NEUROLOGIC:  Denies numbness in extremities, although she admits to paresthesias of the right lower extremity.  She denies dysarthria.   PSYCHIATRIC:  Denies depression or suicidal ideation.    PAST MEDICAL HISTORY:  Recurrent PEs and DVTs   PAST SURGICAL HISTORY:  Bilateral tubal ligation and hysterectomy.   SOCIAL HISTORY:  The patient lives with her mother and fiance.  She does not drink or do any drugs.  She has smoked 1-1/2 packs per day for the last 15 years and admits that she has cut back significantly from a former 3 pack per day habit.    FAMILY HISTORY:  Significant for diabetes, hypertension, and various cancers.   MEDICATIONS:  1. Coumadin 5 mg 1 tablet p.o. daily.  2. Klonopin 1 mg 1 tablet p.o. b.i.d.  3. Lovenox 120 mg/0.8 mL, 120 mg injected b.i.d.  4. Prilosec OTC 20 mg 2 tablets p.o. daily.   ALLERGIES:  IBUPROFEN, PENICILLIN, AND XARELTO.   PERTINENT LABORATORY RESULTS AND RADIOGRAPHIC FINDINGS:  Serum glucose is 126, BUN 13, creatinine 1.08.  Serum sodium is 135, potassium 4.  Chloride is 105, bicarbonate 25.  Calcium is 8.5.  White blood cell count 12.7, hemoglobin 13.4, hematocrit 41.2, platelet count 243,000.  MCV is 85.  INR is 1.3.  Ultrasound of the right lower extremity shows acute occlusive thrombus extending from the common femoral vein through the popliteal vein. The profunda femoris vein is also thrombosed.  Calf vessels are not well visualized due to limitations from  body habitus.     PHYSICAL EXAMINATION:  VITAL SIGNS: Temperature is 97.8, pulse 93, respirations 18, blood pressure 101/65, pulse oximetry 96% on room air.  GENERAL:  The patient is alert and oriented x3, in no apparent distress, although she has tears in her eyes due to the pain she complains of in her right lower extremity.  HEENT:  Normocephalic, atraumatic.  Pupils equal, round, and reactive to light and accommodation.  Extraocular  movements intact.  Mucous membranes are moist.  NECK:  Trachea is midline.  No adenopathy.  Thyroid is nonpalpable, nontender.  CHEST:  Symmetric and atraumatic.  CARDIOVASCULAR:  Regular rate and rhythm.  Normal S1, S2.  No rubs, clicks, or murmurs appreciated.  LUNGS:  Clear to auscultation bilaterally.  Normal effort and excursion.  ABDOMEN:  Positive bowel sounds.  Soft, nontender, nondistended.  No hepatosplenomegaly.  GENITOURINARY:  Deferred.  MUSCULOSKELETAL:  The patient moves her upper extremities bilaterally with full range of motion.  She has full range of motion of her left lower extremity as well, but her right lower extremity range of motion is limited due to pain.  She has 5/5 strength in her extensor muscles as well as flexors in upper and lower extremities bilaterally.  SKIN:  Warm and dry.  There are no rashes or lesions.  EXTREMITIES:  No clubbing or cyanosis.  The patient does have some nonpitting non-tense edema of the right lower extremity.  She expresses pain to resistance on dorsiflexion of the right foot which is equivocally a positive Homans sign.  I do not feel any cords but the leg is mildly warm to touch and reportedly tender at the calf as well.  NEUROLOGIC:  Cranial nerves II through XII are grossly intact.  PSYCHIATRIC:  Mood is normal; affect is congruent.  The patient seems to understand the complexity of her medical condition but it is not clear if she has very good judgment regarding her medical problems.   ASSESSMENT AND PLAN:  This is a 29 year old female admitted for acute occlusive thrombus of the right femoral vein.  1. Deep venous thrombosis.  This is acute on chronic as the patient has had multiple clots in the past.  She has a possible clotting disorder that is not defined specifically in this medical record, although she has been prescribed Coumadin as well as Lovenox concurrently.  Based on her normal INR, it is clear that she has not been taking her  Coumadin or that she has not been compliant with the diet recommended for anticoagulant therapy.  Unfortunately noncompliance seems to be a pattern through the patient's chart.  The Emergency Department physician has discussed her condition with our vascular surgeon who recommended starting a heparin drip.  I have placed a vascular consult order, and she will be seen for possible thrombolysis in the morning.  2. Pain management.  The patient has requested some pain relief between acute doses of narcotics. I have started her on a low dose of methadone for long-acting pain relief.  3. Obesity.  The patient's body mass index is 39.1.  I have encouraged a healthy diet and exercise.  4. Deep venous thrombosis prophylaxis:  The patient is on therapeutic doses of heparin.  5. Gastrointestinal prophylaxis:  None.   CODE STATUS:  The patient is a full code.   TIME SPENT ON ADMISSION ORDERS AND PATIENT CARE:  Approximately 35 minutes.     ____________________________ Kelton PillarMichael S. Sheryle Hailiamond, MD msd:kc D: 07/12/2014 20:37:31 ET T: 07/12/2014  20:58:07 ET JOB#: [id number]  cc: Kelton Pillar. Sheryle Hail, MD, <Dictator> Kelton Pillar Kaydee Magel MD ELECTRONICALLY SIGNED 07/13/2014 0:21

## 2014-08-12 ENCOUNTER — Ambulatory Visit: Payer: Self-pay | Admitting: Oncology

## 2014-08-13 ENCOUNTER — Inpatient Hospital Stay: Payer: Self-pay | Attending: Oncology | Admitting: Oncology

## 2014-08-16 ENCOUNTER — Emergency Department: Payer: Medicaid Other

## 2014-08-16 ENCOUNTER — Inpatient Hospital Stay
Admission: EM | Admit: 2014-08-16 | Discharge: 2014-08-21 | DRG: 176 | Disposition: A | Discharge status: Home / Self Care | Payer: Medicaid Other | Attending: Internal Medicine | Admitting: Internal Medicine

## 2014-08-16 ENCOUNTER — Encounter: Payer: Self-pay | Admitting: Emergency Medicine

## 2014-08-16 DIAGNOSIS — I2782 Chronic pulmonary embolism: Secondary | ICD-10-CM | POA: Diagnosis present

## 2014-08-16 DIAGNOSIS — I2692 Saddle embolus of pulmonary artery without acute cor pulmonale: Secondary | ICD-10-CM | POA: Diagnosis present

## 2014-08-16 DIAGNOSIS — Z86711 Personal history of pulmonary embolism: Secondary | ICD-10-CM | POA: Diagnosis not present

## 2014-08-16 DIAGNOSIS — R0781 Pleurodynia: Secondary | ICD-10-CM

## 2014-08-16 DIAGNOSIS — Z79899 Other long term (current) drug therapy: Secondary | ICD-10-CM | POA: Diagnosis not present

## 2014-08-16 DIAGNOSIS — F419 Anxiety disorder, unspecified: Secondary | ICD-10-CM | POA: Diagnosis present

## 2014-08-16 DIAGNOSIS — K219 Gastro-esophageal reflux disease without esophagitis: Secondary | ICD-10-CM | POA: Diagnosis present

## 2014-08-16 DIAGNOSIS — I2699 Other pulmonary embolism without acute cor pulmonale: Secondary | ICD-10-CM

## 2014-08-16 DIAGNOSIS — F17210 Nicotine dependence, cigarettes, uncomplicated: Secondary | ICD-10-CM | POA: Diagnosis present

## 2014-08-16 DIAGNOSIS — N390 Urinary tract infection, site not specified: Secondary | ICD-10-CM | POA: Diagnosis present

## 2014-08-16 DIAGNOSIS — Z9079 Acquired absence of other genital organ(s): Secondary | ICD-10-CM | POA: Diagnosis present

## 2014-08-16 DIAGNOSIS — Z880 Allergy status to penicillin: Secondary | ICD-10-CM | POA: Diagnosis not present

## 2014-08-16 DIAGNOSIS — Z79891 Long term (current) use of opiate analgesic: Secondary | ICD-10-CM

## 2014-08-16 DIAGNOSIS — Z888 Allergy status to other drugs, medicaments and biological substances status: Secondary | ICD-10-CM

## 2014-08-16 DIAGNOSIS — M358 Other specified systemic involvement of connective tissue: Secondary | ICD-10-CM | POA: Diagnosis present

## 2014-08-16 DIAGNOSIS — Z9851 Tubal ligation status: Secondary | ICD-10-CM | POA: Diagnosis not present

## 2014-08-16 DIAGNOSIS — Z86718 Personal history of other venous thrombosis and embolism: Secondary | ICD-10-CM | POA: Diagnosis not present

## 2014-08-16 HISTORY — DX: Other pulmonary embolism without acute cor pulmonale: I26.99

## 2014-08-16 HISTORY — DX: Systemic involvement of connective tissue, unspecified: M35.9

## 2014-08-16 LAB — CBC
HCT: 37.2 % (ref 35.0–47.0)
HEMOGLOBIN: 12.1 g/dL (ref 12.0–16.0)
MCH: 27.4 pg (ref 26.0–34.0)
MCHC: 32.4 g/dL (ref 32.0–36.0)
MCV: 84.5 fL (ref 80.0–100.0)
Platelets: 167 10*3/uL (ref 150–440)
RBC: 4.4 MIL/uL (ref 3.80–5.20)
RDW: 16 % — AB (ref 11.5–14.5)
WBC: 7.6 10*3/uL (ref 3.6–11.0)

## 2014-08-16 LAB — COMPREHENSIVE METABOLIC PANEL
ALK PHOS: 48 U/L (ref 38–126)
ALT: 21 U/L (ref 14–54)
ANION GAP: 4 — AB (ref 5–15)
AST: 18 U/L (ref 15–41)
Albumin: 3.7 g/dL (ref 3.5–5.0)
BUN: 8 mg/dL (ref 6–20)
CALCIUM: 8.7 mg/dL — AB (ref 8.9–10.3)
CHLORIDE: 108 mmol/L (ref 101–111)
CO2: 27 mmol/L (ref 22–32)
Creatinine, Ser: 0.6 mg/dL (ref 0.44–1.00)
GFR calc non Af Amer: >60 mL/min (ref >60)
Glucose, Bld: 107 mg/dL — ABNORMAL HIGH (ref 65–99)
POTASSIUM: 4.2 mmol/L (ref 3.5–5.1)
Sodium: 139 mmol/L (ref 135–145)
TOTAL PROTEIN: 7.7 g/dL (ref 6.5–8.1)
Total Bilirubin: 0.5 mg/dL (ref 0.3–1.2)

## 2014-08-16 LAB — PROTIME-INR
INR: 1
Prothrombin Time: 13.4 seconds (ref 11.4–15.0)

## 2014-08-16 LAB — APTT: aPTT: 31 seconds (ref 24–36)

## 2014-08-16 LAB — TROPONIN I: Troponin I: <0.03 ng/mL (ref <0.031)

## 2014-08-16 MED ORDER — ACETAMINOPHEN 650 MG RE SUPP: 650.0000 mg | Freq: Four times a day (QID) | RECTAL | Status: DC | PRN

## 2014-08-16 MED ORDER — MORPHINE SULFATE 4 MG/ML IJ SOLN
INTRAMUSCULAR | Status: AC
  Administered 2014-08-16: 4 mg via INTRAVENOUS
  Filled 2014-08-16: qty 1

## 2014-08-16 MED ORDER — ONDANSETRON HCL 4 MG/2ML IJ SOLN
INTRAMUSCULAR | Status: AC
  Administered 2014-08-16: 4 mg via INTRAVENOUS
  Filled 2014-08-16: qty 2

## 2014-08-16 MED ORDER — ACETAMINOPHEN 325 MG PO TABS
650.0000 mg | ORAL_TABLET | Freq: Four times a day (QID) | ORAL | Status: DC | PRN
  Administered 2014-08-18: 650 mg via ORAL
  Filled 2014-08-16: qty 2

## 2014-08-16 MED ORDER — SODIUM CHLORIDE 0.9 % IV BOLUS (SEPSIS)
500.0000 mL | Freq: Once | INTRAVENOUS | Status: AC
  Administered 2014-08-16: 500 mL via INTRAVENOUS

## 2014-08-16 MED ORDER — HEPARIN SODIUM (PORCINE) 5000 UNIT/ML IJ SOLN
INTRAMUSCULAR | Status: AC
  Administered 2014-08-16: 4000 [IU]
  Filled 2014-08-16: qty 1

## 2014-08-16 MED ORDER — HEPARIN (PORCINE) IN NACL 100-0.45 UNIT/ML-% IJ SOLN
1400.0000 [IU]/h | INTRAMUSCULAR | Status: DC
  Administered 2014-08-16: 1400 [IU]/h via INTRAVENOUS
  Filled 2014-08-16 (×3): qty 250

## 2014-08-16 MED ORDER — ONDANSETRON HCL 4 MG PO TABS: 4.0000 mg | ORAL_TABLET | Freq: Four times a day (QID) | ORAL | Status: DC | PRN

## 2014-08-16 MED ORDER — ONDANSETRON HCL 4 MG/2ML IJ SOLN: 4.0000 mg | Freq: Four times a day (QID) | INTRAMUSCULAR | Status: DC | PRN

## 2014-08-16 MED ORDER — CLONAZEPAM 0.5 MG PO TABS
0.5000 mg | ORAL_TABLET | Freq: Two times a day (BID) | ORAL | Status: DC
  Administered 2014-08-16 – 2014-08-21 (×10): 0.5 mg via ORAL
  Filled 2014-08-16 (×10): qty 1

## 2014-08-16 MED ORDER — ONDANSETRON HCL 4 MG/2ML IJ SOLN
4.0000 mg | Freq: Once | INTRAMUSCULAR | Status: AC
  Administered 2014-08-16: 4 mg via INTRAVENOUS

## 2014-08-16 MED ORDER — HYDROCODONE-ACETAMINOPHEN 5-325 MG PO TABS
1.0000 | ORAL_TABLET | Freq: Four times a day (QID) | ORAL | Status: DC
Start: 2014-08-16 — End: 2014-08-17
  Administered 2014-08-16 – 2014-08-17 (×4): 1 via ORAL
  Filled 2014-08-16 (×4): qty 1

## 2014-08-16 MED ORDER — ALBUTEROL SULFATE (2.5 MG/3ML) 0.083% IN NEBU: 2.5000 mg | INHALATION_SOLUTION | RESPIRATORY_TRACT | Status: DC | PRN

## 2014-08-16 MED ORDER — SODIUM CHLORIDE 0.9 % IV BOLUS (SEPSIS)
1000.0000 mL | Freq: Once | INTRAVENOUS | Status: AC
  Administered 2014-08-16: 1000 mL via INTRAVENOUS

## 2014-08-16 MED ORDER — NICOTINE 14 MG/24HR TD PT24
14.0000 mg | MEDICATED_PATCH | Freq: Every day | TRANSDERMAL | Status: DC
  Filled 2014-08-16 (×3): qty 1

## 2014-08-16 MED ORDER — MORPHINE SULFATE 2 MG/ML IJ SOLN
2.0000 mg | INTRAMUSCULAR | Status: DC | PRN
  Administered 2014-08-16 – 2014-08-17 (×4): 2 mg via INTRAVENOUS
  Filled 2014-08-16 (×4): qty 1

## 2014-08-16 MED ORDER — MORPHINE SULFATE 4 MG/ML IJ SOLN
4.0000 mg | Freq: Once | INTRAMUSCULAR | Status: AC
  Administered 2014-08-16: 4 mg via INTRAVENOUS

## 2014-08-16 MED ORDER — HEPARIN BOLUS VIA INFUSION
4000.0000 [IU] | INTRAVENOUS | Status: AC
  Administered 2014-08-16: 4000 [IU] via INTRAVENOUS
  Filled 2014-08-16: qty 4000

## 2014-08-16 MED ORDER — PANTOPRAZOLE SODIUM 40 MG PO TBEC
40.0000 mg | DELAYED_RELEASE_TABLET | Freq: Every day | ORAL | Status: DC
  Administered 2014-08-16 – 2014-08-21 (×6): 40 mg via ORAL
  Filled 2014-08-16 (×6): qty 1

## 2014-08-16 MED ORDER — IOHEXOL 350 MG/ML SOLN
100.0000 mL | Freq: Once | INTRAVENOUS | Status: AC | PRN
  Administered 2014-08-16: 100 mL via INTRAVENOUS

## 2014-08-16 NOTE — Progress Notes (Signed)
MD notified. Pt is not being relieved with scheduled norco. Order for Morphine received. Will continue to assess.

## 2014-08-16 NOTE — Progress Notes (Signed)
Spoke with Dr. Imogene Burnhen regarding patient's new PE.  Patient is currently stable with O2 sats in upper 90's on  RA.  Patient with recent history of R LE DVT and recurrent DVT and PE.  She had thrombolysis on 4/27 with IVC filter placed by Dr. Wyn Quakerew.  She [place].  She will be seen by Dr. Wyn Quakerew in the morning.  If she develops respiratory difficulty over night, [facility] as I do not do PE thrombolysis.  If she remains stable, she will  Be evaluated in the morning.  [name]

## 2014-08-16 NOTE — Progress Notes (Addendum)
ANTICOAGULATION CONSULT NOTE - Initial Consult  Pharmacy Consult for Heparin Indication: pulmonary embolus  Allergies  Allergen Reactions  . Penicillins Other (See Comments)    Reaction:  Unknown  . Xarelto [Rivaroxaban] Other (See Comments)    Reaction: Pt said it "made me feel like I was drunk."    Patient Measurements: Height: 5\' 5"  (165.1 cm) Weight: 240 lb (108.863 kg) IBW/kg (Calculated) : 57 Heparin Dosing Weight: 82.5 kg  Vital Signs: Temp: 98.4 F (36.9 C) (05/30 1352) BP: 112/57 mmHg (05/30 1417) Pulse Rate: 84 (05/30 1417)  Labs:  Recent Labs  08/16/14 1509  HGB 12.1  HCT 37.2  PLT 167  CREATININE 0.60  TROPONINI <0.03    Estimated Creatinine Clearance: 126.3 mL/min (by C-G formula based on Cr of 0.6).   Medical History: Past Medical History  Diagnosis Date  . Pulmonary embolism   . Collagen vascular disease     HX: RT blood clot in leg, PE    Medications:  Scheduled:  . heparin  4,000 Units Intravenous STAT    Assessment: Multiple PE's  Goal of Therapy:  Heparin level 0.3-0.7 units/ml Monitor platelets by anticoagulation protocol: Yes   Plan:  Give 4000 units bolus x 1  Begin Heparin gtt @ 1400 units/hr. Will draw 1st heparin level 6 hrs after start of drip on 5/31 @ 1:00.   Robbins,Jason D 08/16/2014,4:07 PM   5/31 02:00 Anti-Xa <0.1. 2500 unit bolus and increase rate to 1700 units/hr. Recheck in 6 hours.  Fulton ReekMatt Loyd Marhefka, PharmD, BCPS  08/17/2014

## 2014-08-16 NOTE — H&P (Signed)
[facility] - [place] at [facility]   PATIENT NAME: [name]    MR#:  [id number]  DATE OF BIRTH:  Oct 05, 1985  DATE OF ADMISSION:  08/16/2014  PRIMARY CARE PHYSICIAN: Delton Prairie, MD   REQUESTING/REFERRING PHYSICIAN: Dr. Inocencio Homes  CHIEF COMPLAINT:   Chief Complaint  Patient presents with  . Chest Pain    pain with inspiration    Chest pain with shortness of breath for 2 days  HISTORY OF PRESENT ILLNESS:  Ernisha Diedrich  is a 29 y.o. female with a known history of recurrent DVT and PE. The patient has a history of recurrent DVT and PE. Her last admission was on this April for new right femoral  extensive DVT. The patient got IVC filter and thrombectomy at that time, and was discharged with Lovenox. Per patient, she is compliant to Lovenox. He has had the chest pain for 2 days, which is in substernal area with radiation to the back, worsened by deep bruising. She also complains of shortness of breath, but no coughing or sputum or hemoptysis. She denies any active bleeding.CT angiogram of chest showed saddle PE. Dr. Inocencio Homes, ED physician, started heparin drip.   Past Medical History  Diagnosis Date  . Pulmonary embolism   . Collagen vascular disease     HX: RT blood clot in leg, PE    PAST SURGICAL HISTORY:  No past surgical history on file.IVC filter placement and thrombectomy,Bilateral tubal ligation and hysterectomy  SOCIAL HISTORY:   History  Substance Use Topics  . Smoking status: Current Every Day Smoker -- 0.50 packs/day    Types: Cigarettes  . Smokeless tobacco: Not on file  . Alcohol Use: No    FAMILY HISTORY:  No family history on file. hypertension, diabetes and cancers.  DRUG ALLERGIES:   Allergies  Allergen Reactions  . Penicillins Other (See Comments)    Reaction:  Unknown  . Xarelto [Rivaroxaban] Other (See Comments)    Reaction: Pt said it "made me feel like I was drunk."    REVIEW OF SYSTEMS:  CONSTITUTIONAL: No fever, fatigue or  weakness.  EYES: No blurred or double vision.  EARS, NOSE, AND THROAT: No tinnitus or ear pain.  RESPIRATORY: No cough, positive shortness of breath, no wheezing or hemoptysis.  CARDIOVASCULAR: Positive chest pain, no orthopnea, edema.  GASTROINTESTINAL: No nausea, vomiting, diarrhea or abdominal pain.  GENITOURINARY: No dysuria, hematuria.  ENDOCRINE: No polyuria, nocturia,  HEMATOLOGY: No anemia, easy bruising or bleeding SKIN: No rash or lesion. MUSCULOSKELETAL: No joint pain or arthritis.   NEUROLOGIC: No tingling, numbness, weakness.  PSYCHIATRY: No anxiety or depression.   MEDICATIONS AT HOME:   Prior to Admission medications   Medication Sig Start Date End Date Taking? Authorizing Provider  clonazePAM (KLONOPIN) 0.5 MG tablet Take 0.5 mg by mouth 2 (two) times daily.   Yes Historical Provider, MD  enoxaparin (LOVENOX) 150 MG/ML injection Inject 120 mg into the skin 2 (two) times daily.    Yes Historical Provider, MD  HYDROcodone-acetaminophen (NORCO/VICODIN) 5-325 MG per tablet Take 1 tablet by mouth every 6 (six) hours.   Yes Historical Provider, MD  omeprazole (PRILOSEC) 40 MG capsule Take 40 mg by mouth daily.   Yes Historical Provider, MD      VITAL SIGNS:  Blood pressure 112/57, pulse 84, temperature 98.4 F (36.9 C), resp. rate 18, height  (1.651 m), weight 108.863 kg (240 lb), SpO2 98 %.  PHYSICAL EXAMINATION:  GENERAL:  29 y.o.-year-old patient lying  in the bed with no acute distress.  EYES: Pupils equal, round, reactive to light and accommodation. No scleral icterus. Extraocular muscles intact.  HEENT: Head atraumatic, normocephalic. Oropharynx and nasopharynx clear.  NECK:  Supple, no jugular venous distention. No thyroid enlargement, no tenderness.  LUNGS: Normal breath sounds bilaterally, no wheezing, rales,rhonchi or crepitation. No use of accessory muscles of respiration.  CARDIOVASCULAR: S1, S2 normal. No murmurs, rubs, or gallops.  ABDOMEN: Soft,  nontender, nondistended. Bowel sounds present. No organomegaly or mass.  EXTREMITIES: No pedal edema, cyanosis, or clubbing. No calf tenderness. Pedal pulses present. NEUROLOGIC: Cranial nerves II through XII are intact. Muscle strength 5/5 in all extremities. Sensation intact. Gait not checked.  PSYCHIATRIC: The patient is alert and oriented x 3.  SKIN: No obvious rash, lesion, or ulcer.   LABORATORY PANEL:   CBC  Recent Labs Lab 08/16/14 1509  WBC 7.6  HGB 12.1  HCT 37.2  PLT 167   ------------------------------------------------------------------------------------------------------------------  Chemistries   Recent Labs Lab 08/16/14 1509  NA 139  K 4.2  CL 108  CO2 27  GLUCOSE 107*  BUN 8  CREATININE 0.60  CALCIUM 8.7*  AST 18  ALT 21  ALKPHOS 48  BILITOT 0.5   ------------------------------------------------------------------------------------------------------------------  Cardiac Enzymes  Recent Labs Lab 08/16/14 1509  TROPONINI <0.03   ------------------------------------------------------------------------------------------------------------------  RADIOLOGY:  Ct Angio Chest Pe W/cm &/or Wo Cm  08/16/2014   CLINICAL DATA:  Right chest pain, history of multiple PEs  EXAM: CT ANGIOGRAPHY CHEST WITH CONTRAST  TECHNIQUE: Multidetector CT imaging of the chest was performed using the standard protocol during bolus administration of intravenous contrast. Multiplanar CT image reconstructions and MIPs were obtained to evaluate the vascular anatomy.  CONTRAST:  100mL OMNIPAQUE IOHEXOL 350 MG/ML SOLN  COMPARISON:  CTA chest dated 05/11/2014  FINDINGS: Saddle pulmonary embolism at the bifurcation of the left and right main pulmonary arteries (series 5/image 92).  Majority of the thrombus extends into the right main pulmonary artery and is lodged at the takeoff of the right upper lobe pulmonary artery (series 5/ image 96) as well as the bifurcation of the right middle  lobe and right lower lobe pulmonary arteries (series 5/ image 112), partially occlusive.  Elevated RV to LV ratio of 1.0, raising the possibility of right heart strain.  Overall clot burden is large.  Mediastinum/Nodes: The heart is normal in size. No pericardial effusion.  Small mediastinal lymph nodes, including an 11 mm short axis right paratracheal node (series 4/ image 39).  Visualized right thyroid is mildly heterogeneous/nodular.  Lungs/Pleura: Mild subpleural patchy opacities in the right lung.  Left lung is clear.  No suspicious pulmonary nodules.  No pleural effusion or pneumothorax.  Upper abdomen: Visualized upper abdomen is unremarkable.  Musculoskeletal: Visualized osseous structures are within normal limits.  Review of the MIP images confirms the above findings.  IMPRESSION: Saddle pulmonary embolism.  Majority of thrombus is lodged at the bifurcation of the right middle and lower lobe pulmonary arteries as well as the takeoff of the right upper lobe pulmonary artery, partially occlusive.  Overall clot burden is large.  Positive for acute PE with CT evidence of right heart strain (RV/LV Ratio = 1.0) consistent with at least submassive (intermediate risk) PE. The presence of right heart strain has been associated with an increased risk of morbidity and mortality.  Critical Value/emergent results were called by telephone at the time of interpretation on 08/16/2014 at 4:00 pm to Dr. Lenard LancePaduchowski, who verbally acknowledged these results.  Electronically Signed   By: Charline Bills M.D.   On: 08/16/2014 16:02    EKG:   Orders placed or performed during the hospital encounter of 08/16/14  . ED EKG (<70mins upon arrival to the ED)  . ED EKG (<63mins upon arrival to the ED)    IMPRESSION AND PLAN:   Saddle PE History of recurrent PE and DVT Tobacco abuse  The patient will be admitted to telemetry floor. I will continue heparin drip, follow-up PTT and get venous ultrasound of bilateral lower  extremities. In addition, I will get hematology, musculoskeletal surgery and pulmonary consult. Smoking cessation was counseled for 4-5 minutes, give nicotine patch.  All the records are reviewed and case discussed with ED provider. Management plans discussed with the patient, the patient's mother and they are in agreement.  CODE STATUS: Full code  TOTAL TIME TAKING CARE OF THIS PATIENT: 55  minutes.    Shaune Pollack M.D on 08/16/2014 at 4:36 PM  Between 7am to 6pm - Pager - [phone]  After 6pm go to [email or web address] - password [email or web address] [facility]  [facility]  Office  [phone]  CC: Primary care physician; Delton Prairie, MD

## 2014-08-16 NOTE — ED Provider Notes (Signed)
[facility] Emergency Department Provider Note  ____________________________________________  Time seen: Approximately 2:12 PM  I have reviewed the triage vital signs and the nursing notes.   HISTORY  Chief Complaint Chest Pain    HPI [name] is a 29 y.o. 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 Symptoms not worsened by exertion, not associated with sudden nausea, vomiting or diaphoresis. No history of coronary artery disease. Current severity is moderate. She reports compliance with Lovenox. She denies shortness of breath, no recent illness including no cough, sneezing, runny nose, congestion, no fevers or chills. She denies any injuries to the right chest or shoulder.   Past Medical History  Diagnosis Date  . Pulmonary embolism   . Collagen vascular disease     HX: RT blood clot in leg, PE    There are no active problems to display for this patient.   No past surgical history on file.  Current Outpatient Rx  Name  Route  Sig  Dispense  Refill  . clonazePAM (KLONOPIN) 0.5 MG tablet   Oral   Take 0.5 mg by mouth 2 (two) times daily.         Marland Kitchen enoxaparin (LOVENOX) 150 MG/ML injection   Subcutaneous   Inject 120 mg into the skin 2 (two) times daily.          Marland Kitchen HYDROcodone-acetaminophen (NORCO/VICODIN) 5-325 MG per tablet   Oral   Take 1 tablet by mouth every 6 (six) hours.         Marland Kitchen omeprazole (PRILOSEC) 40 MG capsule   Oral   Take 40 mg by mouth daily.           Allergies Penicillins and Xarelto  No family history on file.  Social History History  Substance Use Topics  . Smoking status: Current Every Day Smoker -- 0.50 packs/day    Types: Cigarettes  . Smokeless tobacco: Not on file  . Alcohol Use: No    Review of Systems Constitutional: No  fever/chills Eyes: No visual changes. ENT: No sore throat. Cardiovascular: + chest pain. Respiratory: Denies shortness of breath. Gastrointestinal: No abdominal pain.  No nausea, no vomiting.  No diarrhea.  No constipation. Genitourinary: Negative for dysuria. Musculoskeletal: Negative for back pain. Skin: Negative for rash. Neurological: Negative for headaches, focal weakness or numbness.  10-point ROS otherwise negative.  ____________________________________________   PHYSICAL EXAM:   Filed Vitals:   08/16/14 1352 08/16/14 1417  BP: 109/63 112/57  Pulse: 87 84  Temp: 98.4 F (36.9 C)   Resp: 20 18  Height:  (1.651 m)   Weight: 240 lb (108.863 kg)   SpO2: 97% 98%    VITAL SIGNS: ED Triage Vitals  Enc Vitals Group     BP 08/16/14 1352 109/63 mmHg     Pulse Rate 08/16/14 1352 87     Resp 08/16/14 1352 20     Temp 08/16/14 1352 98.4 F (36.9 C)     Temp src --      SpO2 08/16/14 1352 97 %     Weight 08/16/14 1352 240 lb (108.863 kg)     Height 08/16/14 1352  (1.651 m)     Head Cir --      Peak Flow --      Pain Score 08/16/14 1353 7  Pain Loc --      Pain Edu? --      Excl. in GC? --     Constitutional: Alert and oriented. Well appearing and in no acute distress. Eyes: Conjunctivae are normal. PERRL. EOMI. Head: Atraumatic. Nose: No congestion/rhinnorhea. Mouth/Throat: Mucous membranes are moist.  Oropharynx non-erythematous. Neck: No stridor.  Cardiovascular: Normal rate, regular rhythm. Grossly normal heart sounds.  Good peripheral circulation. Respiratory: Normal respiratory effort.  No retractions. Lungs CTAB. Gastrointestinal: Soft and nontender. No distention. No abdominal bruits. No CVA tenderness. Genitourinary: deferred Musculoskeletal: No lower extremity tenderness nor edema.  No joint effusions. Neurologic:  Normal speech and language. No gross focal neurologic deficits are appreciated. Speech is normal. No gait instability. Skin:   Skin is warm, dry and intact. No rash noted. Psychiatric: Mood and affect are normal. Speech and behavior are normal.  ____________________________________________   LABS (all labs ordered are listed, but only abnormal results are displayed)  Labs Reviewed  CBC - Abnormal; Notable for the following:    RDW 16.0 (*)    All other components within normal limits  COMPREHENSIVE METABOLIC PANEL - Abnormal; Notable for the following:    Glucose, Bld 107 (*)    Calcium 8.7 (*)    Anion gap 4 (*)    All other components within normal limits  TROPONIN I  APTT  PROTIME-INR   ____________________________________________  EKG  ED ECG REPORT I, Gayla Doss, the attending physician, personally viewed and interpreted this ECG.   Date: 08/16/2014  EKG Time: 13:51  Rate: 90  Rhythm: normal sinus rhythm  Axis: normal  Intervals:none, normal intervals, QTC is not prolonged  ST&T Change: No acute ST segment elevation  ____________________________________________  RADIOLOGY  CTA chest  IMPRESSION: Saddle pulmonary embolism.  Majority of thrombus is lodged at the bifurcation of the right middle and lower lobe pulmonary arteries as well as the takeoff of the right upper lobe pulmonary artery, partially occlusive.  Overall clot burden is large.  Positive for acute PE with CT evidence of right heart strain (RV/LV Ratio = 1.0) consistent with at least submassive (intermediate risk) PE. The presence of right heart strain has been associated with an increased risk of morbidity and mortality. ____________________________________________   PROCEDURES  Procedure(s) performed: None  Critical Care performed: Yes, see critical care note(s). Total critical care time spent 40 minutes.  ____________________________________________   INITIAL IMPRESSION / ASSESSMENT AND PLAN / ED COURSE  Pertinent labs & imaging results that were available during my care of the patient were reviewed  by me and considered in my medical decision making (see chart for details).  [name] is a 29 y.o. 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 On exam, she is generally well-appearing and in no acute distress. Vital signs stable, she is afebrile. Concern for recurrent pulmonary embolism given history of same and known hypercoagulable state. We'll obtain basic labs and a troponin, CTA chest. No history of coronary artery disease or family history of early artery disease, EKG reassuring and I doubt ACS.  ----------------------------------------- 4:15 PM on 08/16/2014 -----------------------------------------  CTA chest is revealing for saddle pulmonary embolism with evidence of right heart strain. I have ordered heparin drip. The patient continues to appear well, she remains hemodynamically stable. I discussed case with Dr. Imogene Burn, hospitalist, for admission and he will admit. We discussed that as  she remains HD stable and does not appear to need IR at this moment, will admit here as opposed to [facility]. I discussed this with the patient she is comfortable with admission. Pain has improved at this time. ____________________________________________   FINAL CLINICAL IMPRESSION(S) / ED DIAGNOSES  Final diagnoses:  Pleuritic chest pain  Pulmonary embolism      Gayla DossEryka A Emylia Latella, MD 08/16/14 [phone]

## 2014-08-16 NOTE — ED Notes (Signed)
Denies fevers

## 2014-08-17 ENCOUNTER — Inpatient Hospital Stay: Payer: Medicaid Other

## 2014-08-17 DIAGNOSIS — Z7901 Long term (current) use of anticoagulants: Secondary | ICD-10-CM

## 2014-08-17 DIAGNOSIS — F17210 Nicotine dependence, cigarettes, uncomplicated: Secondary | ICD-10-CM

## 2014-08-17 DIAGNOSIS — I998 Other disorder of circulatory system: Secondary | ICD-10-CM

## 2014-08-17 DIAGNOSIS — G8929 Other chronic pain: Secondary | ICD-10-CM

## 2014-08-17 DIAGNOSIS — Z86711 Personal history of pulmonary embolism: Secondary | ICD-10-CM

## 2014-08-17 DIAGNOSIS — I2699 Other pulmonary embolism without acute cor pulmonale: Secondary | ICD-10-CM

## 2014-08-17 DIAGNOSIS — M79606 Pain in leg, unspecified: Secondary | ICD-10-CM

## 2014-08-17 DIAGNOSIS — Z86718 Personal history of other venous thrombosis and embolism: Secondary | ICD-10-CM

## 2014-08-17 LAB — BASIC METABOLIC PANEL
ANION GAP: 10 (ref 5–15)
BUN: 8 mg/dL (ref 6–20)
CALCIUM: 8 mg/dL — AB (ref 8.9–10.3)
CO2: 23 mmol/L (ref 22–32)
Chloride: 105 mmol/L (ref 101–111)
Creatinine, Ser: 0.66 mg/dL (ref 0.44–1.00)
GFR calc non Af Amer: >60 mL/min (ref >60)
GLUCOSE: 118 mg/dL — AB (ref 65–99)
POTASSIUM: 3.7 mmol/L (ref 3.5–5.1)
SODIUM: 138 mmol/L (ref 135–145)

## 2014-08-17 LAB — HEPARIN LEVEL (UNFRACTIONATED)
HEPARIN UNFRACTIONATED: 0.15 [IU]/mL — AB (ref 0.30–0.70)
Heparin Unfractionated: 0.22 IU/mL — ABNORMAL LOW (ref 0.30–0.70)

## 2014-08-17 LAB — CBC
HEMATOCRIT: 33.9 % — AB (ref 35.0–47.0)
Hemoglobin: 11.1 g/dL — ABNORMAL LOW (ref 12.0–16.0)
MCH: 28.2 pg (ref 26.0–34.0)
MCHC: 32.8 g/dL (ref 32.0–36.0)
MCV: 85.8 fL (ref 80.0–100.0)
Platelets: 152 10*3/uL (ref 150–440)
RBC: 3.95 MIL/uL (ref 3.80–5.20)
RDW: 16 % — ABNORMAL HIGH (ref 11.5–14.5)
WBC: 6.6 10*3/uL (ref 3.6–11.0)

## 2014-08-17 MED ORDER — HEPARIN BOLUS VIA INFUSION
2500.0000 [IU] | Freq: Once | INTRAVENOUS | Status: AC
  Administered 2014-08-17: 2500 [IU] via INTRAVENOUS
  Filled 2014-08-17: qty 2500

## 2014-08-17 MED ORDER — HEPARIN (PORCINE) IN NACL 100-0.45 UNIT/ML-% IJ SOLN
3300.0000 [IU]/h | INTRAMUSCULAR | Status: DC
  Administered 2014-08-17: 1700 [IU]/h via INTRAVENOUS
  Administered 2014-08-17: 2250 [IU]/h via INTRAVENOUS
  Administered 2014-08-17: 2500 [IU]/h via INTRAVENOUS
  Administered 2014-08-18: 1200 [IU]/h via INTRAVENOUS
  Administered 2014-08-18: 2400 [IU]/h via INTRAVENOUS
  Administered 2014-08-19 – 2014-08-20 (×3): 2550 [IU]/h via INTRAVENOUS
  Administered 2014-08-20: 3000 [IU]/h via INTRAVENOUS
  Administered 2014-08-20: 2850 [IU]/h via INTRAVENOUS
  Administered 2014-08-21: 3300 [IU]/h via INTRAVENOUS
  Filled 2014-08-17 (×11): qty 250

## 2014-08-17 MED ORDER — CYCLOBENZAPRINE HCL 10 MG PO TABS: 10.0000 mg | ORAL_TABLET | Freq: Three times a day (TID) | ORAL | Status: DC | PRN

## 2014-08-17 MED ORDER — HEPARIN BOLUS VIA INFUSION
1200.0000 [IU] | Freq: Once | INTRAVENOUS | Status: AC
  Administered 2014-08-17: 1200 [IU] via INTRAVENOUS
  Filled 2014-08-17: qty 1200

## 2014-08-17 MED ORDER — HYDROCODONE-ACETAMINOPHEN 5-325 MG PO TABS
2.0000 | ORAL_TABLET | ORAL | Status: DC | PRN
  Administered 2014-08-17 – 2014-08-21 (×16): 2 via ORAL
  Filled 2014-08-17 (×16): qty 2

## 2014-08-17 MED ORDER — MORPHINE SULFATE 4 MG/ML IJ SOLN
4.0000 mg | INTRAMUSCULAR | Status: DC | PRN
  Administered 2014-08-17 – 2014-08-21 (×18): 4 mg via INTRAVENOUS
  Filled 2014-08-17 (×19): qty 1

## 2014-08-17 NOTE — Care Management (Signed)
Patient known to this care manager from previous admission.  She says she has been compliant with her outpatient Lovenox injections.  She is admitted with submassive right pulmonary embolus.  Had IVC filter placed last admission

## 2014-08-17 NOTE — Consult Note (Signed)
[facility]  Telephone:(336) [phone] Fax:(336) [phone]  ID: [name] OB: 10/22/1985  MR#: [id number]  AOZ#:[id number]#:[id number]  Patient Care Team: Delton PrairiePaul Tobin, MD as PCP - General (Pediatrics)  CHIEF COMPLAINT:  Chief Complaint  Patient presents with  . Chest Pain    pain with inspiration     INTERVAL HISTORY: Patient is a 29 year old female with a long-standing history of multiple DVTs and PEs. [facility] and recommendation was lifelong anticoagulation. Despite having filter placed and insisting she is compliant with her Lovenox injections, she developed extensive PE. Patient continues to have chronic leg pain from previous DVT and thrombus was, but otherwise feels well. Patient offers no further specific complaints today.  REVIEW OF SYSTEMS:   Review of Systems  Constitutional: Negative.   Respiratory: Negative for shortness of breath.   Cardiovascular: Negative for chest pain.  Musculoskeletal:       Leg pain    As per HPI. Otherwise, a complete review of systems is negatve.  PAST MEDICAL HISTORY: Past Medical History  Diagnosis Date  . Pulmonary embolism   . Collagen vascular disease     HX: RT blood clot in leg, PE    PAST SURGICAL HISTORY: History reviewed. No pertinent past surgical history.  FAMILY HISTORY History reviewed. No pertinent family history.     ADVANCED DIRECTIVES:    HEALTH MAINTENANCE: History  Substance Use Topics  . Smoking status: Current Every Day Smoker -- 0.50 packs/day    Types: Cigarettes  . Smokeless tobacco: Not on file  . Alcohol Use: No     Colonoscopy:  PAP:  Bone density:  Lipid panel:  Allergies  Allergen Reactions  . Penicillins Other (See Comments)    Reaction:  Unknown  . Xarelto [Rivaroxaban] Other (See Comments)    Reaction: Pt said it "made me feel like I was drunk."    Current Facility-Administered Medications  Medication Dose Route  Frequency Provider Last Rate Last Dose  . acetaminophen (TYLENOL) tablet 650 mg  650 mg Oral Q6H PRN Shaune PollackQing Chen, MD       Or  . acetaminophen (TYLENOL) suppository 650 mg  650 mg Rectal Q6H PRN Shaune PollackQing Chen, MD      . albuterol (PROVENTIL) (2.5 MG/3ML) 0.083% nebulizer solution 2.5 mg  2.5 mg Nebulization Q2H PRN Shaune PollackQing Chen, MD      . clonazePAM Scarlette Calico(KLONOPIN) tablet 0.5 mg  0.5 mg Oral BID Shaune PollackQing Chen, MD   0.5 mg at 08/17/14 0900  . heparin ADULT infusion 100 units/mL (25000 units/250 mL)  2,100 Units/hr Intravenous Continuous Gayla DossEryka A Gayle, MD 17 mL/hr at 08/17/14 1039 1,700 Units/hr at 08/17/14 1039  . heparin bolus via infusion 2,500 Units  2,500 Units Intravenous Once Gayla DossEryka A Gayle, MD      . HYDROcodone-acetaminophen (NORCO/VICODIN) 5-325 MG per tablet 2 tablet  2 tablet Oral Q4H PRN Enid Baasadhika Kalisetti, MD      . morphine 4 MG/ML injection 4 mg  4 mg Intravenous Q4H PRN Enid Baasadhika Kalisetti, MD   4 mg at 08/17/14 1317  . nicotine (NICODERM CQ - dosed in mg/24 hours) patch 14 mg  14 mg Transdermal Daily Shaune PollackQing Chen, MD   14 mg at 08/16/14 1849  . ondansetron (ZOFRAN) tablet 4 mg  4 mg Oral Q6H PRN Shaune PollackQing Chen, MD       Or  . ondansetron [facility](ZOFRAN) injection 4 mg  4 mg Intravenous Q6H PRN Shaune PollackQing Chen, MD      .  pantoprazole (PROTONIX) EC tablet 40 mg  40 mg Oral Daily Shaune Pollack, MD   40 mg at 08/17/14 0901    OBJECTIVE: Filed Vitals:   08/17/14 1124  BP: 116/60  Pulse: 88  Temp: 97.9 F (36.6 C)  Resp: 19     Body mass index is 40.19 kg/(m^2).    ECOG FS:0 - Asymptomatic  General: Well-developed, well-nourished, no acute distress. Eyes: Pink conjunctiva, anicteric sclera. HEENT: Normocephalic, moist mucous membranes, clear oropharnyx. Lungs: Clear to auscultation bilaterally. Heart: Regular rate and rhythm. No rubs, murmurs, or gallops. Abdomen: Soft, nontender, nondistended. No organomegaly noted, normoactive bowel sounds. Musculoskeletal: No edema, cyanosis, or clubbing. Neuro: Alert, answering all  questions appropriately. Cranial nerves grossly intact. Skin: No rashes or petechiae noted. Psych: Normal affect.    LAB RESULTS:  Lab Results  Component Value Date   NA 138 08/17/2014   K 3.7 08/17/2014   CL 105 08/17/2014   CO2 23 08/17/2014   GLUCOSE 118* 08/17/2014   BUN 8 08/17/2014   CREATININE 0.66 08/17/2014   CALCIUM 8.0* 08/17/2014   PROT 7.7 08/16/2014   ALBUMIN 3.7 08/16/2014   AST 18 08/16/2014   ALT 21 08/16/2014   ALKPHOS 48 08/16/2014   BILITOT 0.5 08/16/2014   GFRNONAA >60 08/17/2014   GFRAA >60 08/17/2014    Lab Results  Component Value Date   WBC 6.6 08/17/2014   NEUTROABS 4.2 07/15/2014   HGB 11.1* 08/17/2014   HCT 33.9* 08/17/2014   MCV 85.8 08/17/2014   PLT 152 08/17/2014     STUDIES: Ct Angio Chest Pe W/cm &/or Wo Cm  08/16/2014   CLINICAL DATA:  Right chest pain, history of multiple PEs  EXAM: CT ANGIOGRAPHY CHEST WITH CONTRAST  TECHNIQUE: Multidetector CT imaging of the chest was performed using the standard protocol during bolus administration of intravenous contrast. Multiplanar CT image reconstructions and MIPs were obtained to evaluate the vascular anatomy.  CONTRAST:  OMNIPAQUE IOHEXOL 350 MG/ML SOLN  COMPARISON:  CTA chest dated 05/11/2014  FINDINGS: Saddle pulmonary embolism at the bifurcation of the left and right main pulmonary arteries (series 5/image 92).  Majority of the thrombus extends into the right main pulmonary artery and is lodged at the takeoff of the right upper lobe pulmonary artery (series 5/ image 96) as well as the bifurcation of the right middle lobe and right lower lobe pulmonary arteries (series 5/ image 112), partially occlusive.  Elevated RV to LV ratio of 1.0, raising the possibility of right heart strain.  Overall clot burden is large.  Mediastinum/Nodes: The heart is normal in size. No pericardial effusion.  Small mediastinal lymph nodes, including an 11 mm short axis right paratracheal node (series 4/ image 39).   Visualized right thyroid is mildly heterogeneous/nodular.  Lungs/Pleura: Mild subpleural patchy opacities in the right lung.  Left lung is clear.  No suspicious pulmonary nodules.  No pleural effusion or pneumothorax.  Upper abdomen: Visualized upper abdomen is unremarkable.  Musculoskeletal: Visualized osseous structures are within normal limits.  Review of the MIP images confirms the above findings.  IMPRESSION: Saddle pulmonary embolism.  Majority of thrombus is lodged at the bifurcation of the right middle and lower lobe pulmonary arteries as well as the takeoff of the right upper lobe pulmonary artery, partially occlusive.  Overall clot burden is large.  Positive for acute PE with CT evidence of right heart strain (RV/LV Ratio = 1.0) consistent with at least submassive (intermediate risk) PE. The presence of right heart  strain has been associated with an increased risk of morbidity and mortality.  Critical Value/emergent results were called by telephone at the time of interpretation on 08/16/2014 at 4:00 pm to Dr. Lenard Lance, who verbally acknowledged these results.   Electronically Signed   By: Charline Bills M.D.   On: 08/16/2014 16:02    ASSESSMENT:  Recurrent DVT/PE despite anticoagulation and filter placement.  PLAN:   1. Recurrent DVT/PE: Appreciate vascular surgery input. Previously it was recommended patient be on lifelong anticoagulation. She previously was on Xarelto, but stated this made her "drunk" and cannot take it. She continues to take her Lovenox injections and insists she is compliant and has not missed a single dose.It is unclear if she is truly a Lovenox failure since patient has been known to be noncompliant in the past. She was seen by Dr. Isaiah Serge at [facility] last fall who recommended Eliquis. Patient states she cannot afford this. Patient will require lifelong anticoagulation likely with Eliquis.  Patient can follow-up in the Cancer Center 2-3 weeks after discharge for further  evaluation. Also recommend scheduling an appointment with Dr. Isaiah Serge at [facility] for further evaluation.  Appreciate consult, call with questions.  Patient expressed understanding and was in agreement with this plan. She also understands that She can call clinic at any time with any questions, concerns, or complaints.   No matching staging information was found for the patient.  Jessica Ruths, MD   08/17/2014 1:50 PM

## 2014-08-17 NOTE — Progress Notes (Signed)
ANTICOAGULATION CONSULT NOTE -   Pharmacy Consult for Heparin Indication: pulmonary embolus  Allergies  Allergen Reactions  . Penicillins Other (See Comments)    Reaction:  Unknown  . Xarelto [Rivaroxaban] Other (See Comments)    Reaction: Pt said it "made me feel like I was drunk."    Patient Measurements: Height: 5\' 5"  (165.1 cm) Weight: 241 lb 8 oz (109.544 kg) IBW/kg (Calculated) : 57 Heparin Dosing Weight: 82.5 kg  Vital Signs: Temp: 97.9 F (36.6 C) (05/31 1124) Temp Source: Oral (05/31 1124) BP: 116/60 mmHg (05/31 1124) Pulse Rate: 88 (05/31 1124)  Labs:  Recent Labs  08/16/14 1509 08/17/14 0220 08/17/14 1231  HGB 12.1 11.1*  --   HCT 37.2 33.9*  --   PLT 167 152  --   APTT 31  --   --   LABPROT 13.4  --   --   INR 1.00  --   --   HEPARINUNFRC  --  <0.10* 0.15*  CREATININE 0.60 0.66  --   TROPONINI <0.03  --   --     Estimated Creatinine Clearance: 126.6 mL/min (by C-G formula based on Cr of 0.66).   Medical History: Past Medical History  Diagnosis Date  . Pulmonary embolism   . Collagen vascular disease     HX: RT blood clot in leg, PE    Medications:  Scheduled:  . clonazePAM  0.5 mg Oral BID  . heparin  2,500 Units Intravenous Once  . nicotine  14 mg Transdermal Daily  . pantoprazole  40 mg Oral Daily    Assessment: Multiple PE's  Goal of Therapy:  Heparin level 0.3-0.7 units/ml Monitor platelets by anticoagulation protocol: Yes   Plan:  Give 4000 units bolus x 1  Begin Heparin gtt @ 1400 units/hr. Will draw 1st heparin level 6 hrs after start of drip on 5/31 @ 1:00.     5/31 02:00 Anti-Xa <0.1. 2500 unit bolus and increase rate to 1700 units/hr. Recheck in 6 hours.  5/31 ~12:00 Heparin level resulted @ 0.15. Will give heparin bolus of 2500 units and will increase heparin rate to 2100 units/hr. Recheck in 6 hours.   Demetrius Charityeldrin D. Karyn Brull, PharmD  08/17/2014

## 2014-08-17 NOTE — Care Management (Signed)
Spoke with pharmacy assistance program pharmacist- [facility].  Confirmed that Eliquis is not on the  Formulary and that an exception to this formulary can not be made at this time.  If patient is to be restarted on Eliquis, she can apply to the financial assistance program.  Placed forms in patient's room.  She is currently off the unit for testing.  She has already used the Eliquis 30 day free coupon which is a one time offer.  It can not be repeated.   [facility]arel[facility] is [facility] pharmacy [facility] formulary but it is documented that Xarelto made patient feel "drunk: when used in the past.  Discussed with [facility] and informed that an exception can not be made to allow patient to obtain meds from the clinic when she is already [facility].  Attending will discuss with oncology.  Xarel[facility] is on the [facility] formulary.   Having patient start on Xarelto and take just as long it takes to get approval from [facility] patient assistance program for the Eliquis may be an option.  When gets approval- could switch to Eliquis.  There are concerns that patient may not be compliant with injections.  She says she is compliant.  Last script for 60 injections was picked up on 4/27.   Will place physcian portion of Eliquis financial assistance  Form in the paper record.

## 2014-08-17 NOTE — Progress Notes (Addendum)
ANTICOAGULATION CONSULT NOTE - Follow Up Consult  Pharmacy Consult for Heparin Indication: pulmonary embolus  Allergies  Allergen Reactions  . Penicillins Other (See Comments)    Reaction:  Unknown  . Xarelto [Rivaroxaban] Other (See Comments)    Reaction: Pt said it "made me feel like I was drunk."    Patient Measurements: Height: 5\' 5"  (165.1 cm) Weight: 241 lb 8 oz (109.544 kg) IBW/kg (Calculated) : 57 Heparin Dosing Weight: 82.7 kg  Vital Signs: Temp: 99.2 F (37.3 C) (05/31 1936) Temp Source: Oral (05/31 1936) BP: 125/59 mmHg (05/31 1936) Pulse Rate: 99 (05/31 1936)  Labs:  Recent Labs  08/16/14 1509 08/17/14 0220 08/17/14 1231 08/17/14 1944  HGB 12.1 11.1*  --   --   HCT 37.2 33.9*  --   --   PLT 167 152  --   --   APTT 31  --   --   --   LABPROT 13.4  --   --   --   INR 1.00  --   --   --   HEPARINUNFRC  --  <0.10* 0.15* 0.22*  CREATININE 0.60 0.66  --   --   TROPONINI <0.03  --   --   --     Estimated Creatinine Clearance: 126.6 mL/min (by C-G formula based on Cr of 0.66).   Medications:  Scheduled:  . clonazePAM  0.5 mg Oral BID  . heparin  1,200 Units Intravenous Once  . nicotine  14 mg Transdermal Daily  . pantoprazole  40 mg Oral Daily   Infusions:  . heparin 2,500 Units/hr (08/17/14 1408)   PRN: acetaminophen **OR** acetaminophen, albuterol, cyclobenzaprine, HYDROcodone-acetaminophen, morphine injection, ondansetron **OR** ondansetron (ZOFRAN) IV  Assessment: Patient with mx PEs and HL is below goal.   Goal of Therapy:  Heparin level 0.3-0.7 units/ml Monitor platelets by anticoagulation protocol: Yes   Plan:  Will bolus heparin 1200 units iv, increase infusion to 22.5 ml/hr, and recheck HL/CBC in 6 h.   Luisa HartChristy, Scott D 08/17/2014,8:44 PM     6/1 Heparin level  0.29. 1200 unit bolus and increase rate to 2400 units/hr. Recheck in 6 hours.  Fulton ReekMatt Fremont Skalicky, PharmD, BCPS  08/18/2014

## 2014-08-17 NOTE — Progress Notes (Signed)
[facility] - [place] at [facility]   PATIENT NAME: [name]    MR#:  [id number]  DATE OF BIRTH:  1985/06/02  SUBJECTIVE:  CHIEF COMPLAINT:   Chief Complaint  Patient presents with  . Chest Pain    pain with inspiration   - worried about her pain medications -So she was on Vicodin at home, the current regimen of 2 mg IV morphine and Norco are not helping her at all. 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 -Complaints of right-sided chest pain radiating to the back and also shoulders  REVIEW OF SYSTEMS:  Review of Systems  Constitutional: Negative for fever and chills.  Respiratory: Negative for cough, shortness of breath and wheezing.   Cardiovascular: Negative for chest pain and palpitations.  Gastrointestinal: Negative for nausea, vomiting, abdominal pain, diarrhea and constipation.  Genitourinary: Negative for dysuria.  Neurological: Negative for dizziness, seizures and headaches.    DRUG ALLERGIES:   Allergies  Allergen Reactions  . Penicillins Other (See Comments)    Reaction:  Unknown  . Xarelto [Rivaroxaban] Other (See Comments)    Reaction: Pt said it "made me feel like I was drunk."    VITALS:  Blood pressure 116/60, pulse 88, temperature 97.9 F (36.6 C), temperature source Oral, resp. rate 19, height  (1.651 m), weight 109.544 kg (241 lb 8 oz), SpO2 98 %.  PHYSICAL EXAMINATION:  Physical Exam  GENERAL:  29 y.o.-year-old obese patient sitting in the bed with no acute distress.  EYES: Pupils equal, round, reactive to light and accommodation. No scleral icterus. Extraocular muscles intact.  HEENT: Head atraumatic, normocephalic. Oropharynx and nasopharynx clear.  NECK:  Supple, no jugular venous distention. No thyroid enlargement, no tenderness.  LUNGS: Normal breath sounds bilaterally, no wheezing, rales,rhonchi or crepitation. No use of accessory muscles of respiration.  CARDIOVASCULAR: S1,  S2 normal. No murmurs, rubs, or gallops.  ABDOMEN: Soft, nontender, nondistended. Bowel sounds present. No organomegaly or mass.  EXTREMITIES: 1+ pedal edema, right greater than left, no cyanosis, or clubbing.  NEUROLOGIC: Cranial nerves II through XII are intact. Muscle strength 5/5 in all extremities. Sensation intact. Gait not checked.  PSYCHIATRIC: The patient is alert and oriented x 3.  SKIN: No obvious rash, lesion, or ulcer.    LABORATORY PANEL:   CBC  Recent Labs Lab 08/17/14 0220  WBC 6.6  HGB 11.1*  HCT 33.9*  PLT 152   ------------------------------------------------------------------------------------------------------------------  Chemistries   Recent Labs Lab 08/16/14 1509 08/17/14 0220  NA 139 138  K 4.2 3.7  CL 108 105  CO2 27 23  GLUCOSE 107* 118*  BUN 8 8  CREATININE 0.60 0.66  CALCIUM 8.7* 8.0*  AST 18  --   ALT 21  --   ALKPHOS 48  --   BILITOT 0.5  --    ------------------------------------------------------------------------------------------------------------------  Cardiac Enzymes  Recent Labs Lab 08/16/14 1509  TROPONINI <0.03   ------------------------------------------------------------------------------------------------------------------  RADIOLOGY:  Ct Angio Chest Pe W/cm &/or Wo Cm  08/16/2014   CLINICAL DATA:  Right chest pain, history of multiple PEs  EXAM: CT ANGIOGRAPHY CHEST WITH CONTRAST  TECHNIQUE: Multidetector CT imaging of the chest was performed using the standard protocol during bolus administration of intravenous contrast. Multiplanar CT image reconstructions and MIPs were obtained to evaluate the vascular anatomy.  CONTRAST:  OMNIPAQUE IOHEXOL 350 MG/ML SOLN  COMPARISON:  CTA chest dated 05/11/2014  FINDINGS: Saddle pulmonary embolism at the bifurcation of the  left and right main pulmonary arteries (series 5/image 92).  Majority of the thrombus extends into the right main pulmonary artery and is lodged at the  takeoff of the right upper lobe pulmonary artery (series 5/ image 96) as well as the bifurcation of the right middle lobe and right lower lobe pulmonary arteries (series 5/ image 112), partially occlusive.  Elevated RV to LV ratio of 1.0, raising the possibility of right heart strain.  Overall clot burden is large.  Mediastinum/Nodes: The heart is normal in size. No pericardial effusion.  Small mediastinal lymph nodes, including an 11 mm short axis right paratracheal node (series 4/ image 39).  Visualized right thyroid is mildly heterogeneous/nodular.  Lungs/Pleura: Mild subpleural patchy opacities in the right lung.  Left lung is clear.  No suspicious pulmonary nodules.  No pleural effusion or pneumothorax.  Upper abdomen: Visualized upper abdomen is unremarkable.  Musculoskeletal: Visualized osseous structures are within normal limits.  Review of the MIP images confirms the above findings.  IMPRESSION: Saddle pulmonary embolism.  Majority of thrombus is lodged at the bifurcation of the right middle and lower lobe pulmonary arteries as well as the takeoff of the right upper lobe pulmonary artery, partially occlusive.  Overall clot burden is large.  Positive for acute PE with CT evidence of right heart strain (RV/LV Ratio = 1.0) consistent with at least submassive (intermediate risk) PE. The presence of right heart strain has been associated with an increased risk of morbidity and mortality.  Critical Value/emergent results were called by telephone at the time of interpretation on 08/16/2014 at 4:00 pm to Dr. Lenard Lance, who verbally acknowledged these results.   Electronically Signed   By: Charline Bills M.D.   On: 08/16/2014 16:02    EKG:   Orders placed or performed during the hospital encounter of 08/16/14  . ED EKG (<76mins upon arrival to the ED)  . ED EKG (<69mins upon arrival to the ED)  . EKG 12-Lead  . EKG 12-Lead    ASSESSMENT AND PLAN:   29 year old obese female with known history of  recurrent DVTs and PE comes to the hospital secondary to chest pain and noted to have saddle embolus.  -#1 acute pulmonary embolism-patient with known history of PE and DVT, on Lovenox now coming with chest pain and CT chest noted to have acute pulmonary embolism. -She is noted to have saddle embolus with right heart strain. -Continue IV heparin at this time. She can be construed as Lovenox failure. As she has had recent DVT last month while on Lovenox 2. Outpatient hematology follow-up for further workup. Smoking cessation advice. Will need to be discharged on new anticoagulation -probably eliquis. Care manager will help with medication management at this time. -Appreciate hematology consult.  #2 chest pain -likely secondary to pulmonary embolism. Adjust her pain medications. Pulmonary has been consulted Vascular has seen the patient, and did not think that she will need thrombolytic lysis.  #3 DVT-recent right leg DVT 3 weeks ago, intervention done by vascular. Patient has history of IVC filter placement. No plans to place another one about it at this time. Appreciate vascular consult.  #4 anxiety-Klonopin  #5 GERD-on protonix  #6 tobacco use disorder-on nicotine patch.   All the records are reviewed and case discussed with Care Management/Social Workerr. Management plans discussed with the patient, family and they are in agreement.  CODE STATUS: Full code  TOTAL TIME TAKING CARE OF THIS PATIENT: 38 minutes.   POSSIBLE D/C IN 2-3 DAYS, DEPENDING  ON CLINICAL CONDITION.   Enid BaasKALISETTI,Xenia Nile M.D on 08/17/2014 at 2:35 PM  Between 7am to 6pm - Pager - [phone]  After 6pm go to [email or web address] - password [email or web address] [facility]  [facility]  Office  [phone]  CC: Primary care physician; Delton Prairieobin, Paul, MD

## 2014-08-17 NOTE — Consult Note (Signed)
[facility] V[facility] Consult Note  MRN : [id number]  [name] is a 29 y.o. (January 24, 1986) female who presents with chief complaint of  Chief Complaint  Patient presents with  . Chest Pain    pain with inspiration   .  History of Present Illness: Patient with long history of thrombotic issues admitted with large PE.  This is in spite of an IVC filter being placed a month ago as part of a thrombolysis and thrombectomy for extensive RLE DVT.  She has been having severe, at times 10/10 chest pain and shortness of breath for 4-5 days.  Has CT scan which I have reviewed showing submassive PE.  Leg is hurting a little less, but still having pain.  Says she was taking Lovenox and her oral anticoagulant.  Pain and shortness of breath a little better today on Heparin.  Sitting with right leg dangling currently, and swelling is moderate.    Current Facility-Administered Medications  Medication Dose Route Frequency Provider Last Rate Last Dose  . acetaminophen (TYLENOL) tablet 650 mg  650 mg Oral Q6H PRN Shaune Pollack, MD       Or  . acetaminophen (TYLENOL) suppository 650 mg  650 mg Rectal Q6H PRN Shaune Pollack, MD      . albuterol (PROVENTIL) (2.5 MG/3ML) 0.083% nebulizer solution 2.5 mg  2.5 mg Nebulization Q2H PRN Shaune Pollack, MD      . clonazePAM Scarlette Calico) tablet 0.5 mg  0.5 mg Oral BID Shaune Pollack, MD   0.5 mg at 08/16/14 2225  . heparin ADULT infusion 100 units/mL (25000 units/250 mL)  1,700 Units/hr Intravenous Continuous Gayla Doss, MD 17 mL/hr at 08/17/14 0610 1,700 Units/hr at 08/17/14 0610  . HYDROcodone-acetaminophen (NORCO/VICODIN) 5-325 MG per tablet 1 tablet  1 tablet Oral Q6H Shaune Pollack, MD   1 tablet at 08/17/14 [phone]  . morphine 2 MG/ML injection 2 mg  2 mg Intravenous Q4H PRN Shaune Pollack, MD   2 mg at 08/17/14 0533  . nicotine (NICODERM CQ - dosed in mg/24 hours) patch 14 mg  14 mg Transdermal Daily Shaune Pollack, MD   14 mg at 08/16/14 1849  . ondansetron (ZOFRAN) tablet 4  mg  4 mg Oral Q6H PRN Shaune Pollack, MD       Or  . ondansetron [facility]) injection 4 mg  4 mg Intravenous Q6H PRN Shaune Pollack, MD      . pantoprazole (PROTONIX) EC tablet 40 mg  40 mg Oral Daily Shaune Pollack, MD   40 mg at 08/16/14 5621    Past Medical History  Diagnosis Date  . Pulmonary embolism   . Collagen vascular disease     HX: RT blood clot in leg, PE    History reviewed. No pertinent past surgical history.  Social History History  Substance Use Topics  . Smoking status: Current Every Day Smoker -- 0.50 packs/day    Types: Cigarettes  . Smokeless tobacco: Not on file  . Alcohol Use: No  Lives at home  Family History History reviewed. No pertinent family history.no history of clotting or bleeding issues  Allergies  Allergen Reactions  . Penicillins Other (See Comments)    Reaction:  Unknown  . Xarelto [Rivaroxaban] Other (See Comments)    Reaction: Pt said it "made me feel like I was drunk."     REVIEW OF SYSTEMS (Negative unless checked)  Constitutional: Weight loss  Fever  Chills Cardiac: Chest pain   Chest pressure     Palpitations   Shortness of breath when laying flat   Shortness of breath at rest   Shortness of breath with exertion. Vascular:  Pain in legs with walking   Pain in legs at rest   Pain in legs when laying flat   Claudication   Pain in feet when walking  Pain in feet at rest  Pain in feet when laying flat   History of DVT   Phlebitis   Swelling in legs   Varicose veins   Non-healing ulcers Pulmonary:   Uses home oxygen   Productive cough   Hemoptysis   Wheeze  COPD   Asthma Neurologic:  Dizziness  Blackouts   Seizures   History of stroke   History of TIA  Aphasia   Temporary blindness   Dysphagia   Weakness or numbness in arms   Weakness or numbness in legs Musculoskeletal:  Arthritis   Joint swelling   Joint pain   Low back pain Hematologic:  Easy bruising  Easy  bleeding   Hypercoagulable state   Anemic  Hepatitis Gastrointestinal:  Blood in stool   Vomiting blood  Gastroesophageal reflux/heartburn   Difficulty swallowing. Genitourinary:  Chronic kidney disease   Difficult urination  Frequent urination  Burning with urination   Blood in urine Skin:  Rashes   Ulcers   Wounds Psychological:  History of anxiety    History of major depression.  Physical Examination  Filed Vitals:   08/16/14 1708 08/16/14 1820 08/16/14 1946 08/17/14 0534  BP: 118/96 120/69 122/68 136/66  Pulse: 75 74  105  Temp:  98.9 F (37.2 C) 98.6 F (37 C) 98.4 F (36.9 C)  TempSrc:  Oral  Oral  Resp: Height:   (1.651 m)    Weight:  241 lb 8 oz (109.544 kg)    SpO2: 98% 98%  98%   Body mass index is 40.19 kg/(m^2).  Head: [place]/AT, No temporalis wasting. Prominent temp pulse not noted. Ear/Nose/Throat: Hearing grossly intact, nares w/o erythema or drainage, oropharynx w/o Erythema/Exudate,   Eyes: PERRLA, EOMI.  Neck: Supple, no nuchal rigidity.  No bruit or JVD.  Pulmonary:  Good air movement, clear to auscultation bilaterally.  Cardiac: RRR, normal S1, S2, no Murmurs, rubs or gallops. Vascular: right leg with 2+ edema, left leg with 1+ edema Vessel Right Left  Radial Palpable Palpable  Ulnar Palpable Palpable  Brachial Palpable Palpable  Carotid Palpable, without bruit Palpable, without bruit  Aorta Not palpable N/A  Femoral Palpable Palpable  Popliteal Palpable Palpable  PT Palpable Palpable  DP Palpable Palpable   Gastrointestinal: soft, non-tender/non-distended. No guarding/reflex. No masses, surgical incisions, or scars. Musculoskeletal: M/S 5/5 throughout.  Extremities without ischemic changes.  No deformity or atrophy. Neurologic: CN 2-12 intact. Pain and light touch intact in extremities.  Symmetrical.  Speech is fluent. Motor exam as listed above. Psychiatric: Judgment intact, Mood & affect appropriate  for pt's clinical situation. Dermatologic: No rashes or ulcers noted.  No cellulitis or open wounds. Lymph : No Cervical, Axillary, or Inguinal lymphadenopathy.    CBC Lab Results  Component Value Date   WBC 6.6 08/17/2014   HGB 11.1* 08/17/2014   HCT 33.9* 08/17/2014   MCV 85.8 08/17/2014   PLT 152 08/17/2014    BMET    Component Value Date/Time   NA 139 08/16/2014 1509   NA 136 07/14/2014 0458   K 4.2 08/16/2014 1509   K 3.6 07/14/2014 0458   CL 108  08/16/2014 1509   CL 104 07/14/2014 0458   CO2 27 08/16/2014 1509   CO2 26 07/14/2014 0458   GLUCOSE 107* 08/16/2014 1509   GLUCOSE 142* 07/14/2014 0458   BUN 8 08/16/2014 1509   BUN 15 07/14/2014 0458   CREATININE 0.60 08/16/2014 1509   CREATININE 0.76 07/14/2014 0458   CALCIUM 8.7* 08/16/2014 1509   CALCIUM 8.2* 07/14/2014 0458   GFRNONAA >60 08/16/2014 1509   GFRNONAA >60 07/14/2014 0458   GFRAA >60 08/16/2014 1509   GFRAA >60 07/14/2014 0458   Estimated Creatinine Clearance: 126.6 mL/min (by C-G formula based on Cr of 0.6).  COAG Lab Results  Component Value Date   INR 1.00 08/16/2014   INR 1.3 07/12/2014   INR 1.0 07/06/2014    Radiology Submassive PE    Assessment/Plan 1. PE. This is present despite IVC filter in place and reportedly taking anticoagulation.  Off oxygen and breathing comfortably.  Does not need pulmonary thrombolysis at this time as she is quite stable.   Can consider imaging to see if another filter can be placed above this filter, but doubt that will be possible as her suprarenal IVC is likely too large to allow another filter placement. She understands this currently Continue Anticoagulation with heparin.  Should get hematology consult to plan long term strategy 2. DVT and postphlebitic right leg.  Elevate as tolerated, continue anticoagulation.  Compression stockings at home.  Looks a fair bit better than before her thrombolysis last month 3. Hypercoagulable state.  Long term  anticoagulation 4. TObacco use.  Likely increasing her thrombotic potential.  Cessation should be considered.      Slayton Lubitz, MD  08/17/2014 8:35 AM    Level 4 consult

## 2014-08-18 LAB — HEPARIN LEVEL (UNFRACTIONATED)
Heparin Unfractionated: 0.29 IU/mL — ABNORMAL LOW (ref 0.30–0.70)
Heparin Unfractionated: 0.28 IU/mL — ABNORMAL LOW (ref 0.30–0.70)
Heparin Unfractionated: 0.42 IU/mL (ref 0.30–0.70)

## 2014-08-18 LAB — CBC WITH DIFFERENTIAL/PLATELET
Basophils Absolute: 0.1 10*3/uL (ref 0–0.1)
Basophils Relative: 1 %
EOS ABS: 0.1 10*3/uL (ref 0–0.7)
EOS PCT: 1 %
HEMATOCRIT: 33.3 % — AB (ref 35.0–47.0)
HEMOGLOBIN: 10.6 g/dL — AB (ref 12.0–16.0)
LYMPHS PCT: 33 %
Lymphs Abs: 2.6 10*3/uL (ref 1.0–3.6)
MCH: 26.9 pg (ref 26.0–34.0)
MCHC: 31.7 g/dL — AB (ref 32.0–36.0)
MCV: 84.8 fL (ref 80.0–100.0)
MONO ABS: 0.5 10*3/uL (ref 0.2–0.9)
MONOS PCT: 6 %
NEUTROS PCT: 59 %
Neutro Abs: 4.8 10*3/uL (ref 1.4–6.5)
Platelets: 160 10*3/uL (ref 150–440)
RBC: 3.92 MIL/uL (ref 3.80–5.20)
RDW: 15.8 % — AB (ref 11.5–14.5)
WBC: 8 10*3/uL (ref 3.6–11.0)

## 2014-08-18 MED ORDER — HEPARIN BOLUS VIA INFUSION
1200.0000 [IU] | Freq: Once | INTRAVENOUS | Status: AC
  Administered 2014-08-18: 1200 [IU] via INTRAVENOUS
  Filled 2014-08-18: qty 1200

## 2014-08-18 MED ORDER — HEPARIN BOLUS VIA INFUSION
1200.0000 [IU] | Freq: Once | INTRAVENOUS | Status: AC
  Administered 2014-08-18: 1200 [IU] via INTRAVENOUS
  Filled 2014-08-18 (×3): qty 1200

## 2014-08-18 NOTE — Progress Notes (Addendum)
ANTICOAGULATION CONSULT NOTE - Follow Up Consult  Pharmacy Consult for Heparin Indication: pulmonary embolus  Allergies  Allergen Reactions  . Penicillins Other (See Comments)    Reaction:  Unknown  . Xarelto [Rivaroxaban] Other (See Comments)    Reaction: Pt said it "made me feel like I was drunk."    Patient Measurements: Height: 5\' 5"  (165.1 cm) Weight: 245 lb 9.6 oz (111.403 kg) IBW/kg (Calculated) : 57 Heparin Dosing Weight: 82.7 kg  Vital Signs: Temp: 98.2 F (36.8 C) (06/01 1102) Temp Source: Oral (06/01 1102) BP: 157/70 mmHg (06/01 1102) Pulse Rate: 96 (06/01 1102)  Labs:  Recent Labs  08/16/14 1509 08/17/14 0220  08/17/14 1944 08/18/14 0217 08/18/14 1240  HGB 12.1 11.1*  --   --  10.6*  --   HCT 37.2 33.9*  --   --  33.3*  --   PLT 167 152  --   --  160  --   APTT 31  --   --   --   --   --   LABPROT 13.4  --   --   --   --   --   INR 1.00  --   --   --   --   --   HEPARINUNFRC  --  <0.10*  < > 0.22* 0.29* 0.28*  CREATININE 0.60 0.66  --   --   --   --   TROPONINI <0.03  --   --   --   --   --   < > = values in this interval not displayed.  Estimated Creatinine Clearance: 127.9 mL/min (by C-G formula based on Cr of 0.66).   Medications:  Scheduled:  . clonazePAM  0.5 mg Oral BID  . heparin  1,200 Units Intravenous Once  . nicotine  14 mg Transdermal Daily  . pantoprazole  40 mg Oral Daily   Infusions:  . heparin 2,400 Units/hr (08/18/14 1143)   PRN: acetaminophen **OR** acetaminophen, albuterol, cyclobenzaprine, HYDROcodone-acetaminophen, morphine injection, ondansetron **OR** ondansetron (ZOFRAN) IV  Assessment: Patient with mx PEs and HL is below goal.   Goal of Therapy:  Heparin level 0.3-0.7 units/ml Monitor platelets by anticoagulation protocol: Yes   Plan:  Will order heparin 1200 units x 1 bolus and will increase heparin gtt rate to 2550 units/hr. Will order next heparin level to be drawn @ 2030.  6/1:   HL @ 19:45 = 0.42 Will  draw confirmation HL on 6/2 @ 2:00.   Demetrius Charityeldrin D. James, PharmD

## 2014-08-18 NOTE — Clinical Social Work Note (Signed)
CSW notified pt financial counseling about pt's and left VM asking them to f/u with pt for possible medicaid application.  CSW signing off 

## 2014-08-18 NOTE — Progress Notes (Signed)
Patient still on heparin drip , no abnormal bleeding noted at this time , still c/o right side  pain and was medicated as order, care ongoing

## 2014-08-18 NOTE — Progress Notes (Signed)
[facility] [place] at [facility]   PATIENT NAME: [name]    MR#:  [id number]  DATE OF BIRTH:  12/28/1985  SUBJECTIVE:  CHIEF COMPLAINT:   Chief Complaint  Patient presents with  . Chest Pain    pain with inspiration   - seems very comfortable, but still complains of right-sided pain. -Lying in bed and kind of sleepy today. Arousable and states that coughing hurts her right side of chest. -Discussed about Xarelto, states that she cannot take it because of the significant swimmy headedness that it caused. -Patient assistance program for eliquis has been given  REVIEW OF SYSTEMS:  Review of Systems  Constitutional: Negative for fever and chills.  Respiratory: Positive for cough and shortness of breath. Negative for wheezing.   Cardiovascular: Positive for chest pain. Negative for palpitations.       Right sided chest pain  Gastrointestinal: Negative for nausea, vomiting, abdominal pain, diarrhea and constipation.  Genitourinary: Negative for dysuria.  Neurological: Negative for dizziness, seizures and headaches.    DRUG ALLERGIES:   Allergies  Allergen Reactions  . Penicillins Other (See Comments)    Reaction:  Unknown  . Xarelto [Rivaroxaban] Other (See Comments)    Reaction: Pt said it "made me feel like I was drunk."    VITALS:  Blood pressure 157/70, pulse 96, temperature 98.2 F (36.8 C), temperature source Oral, resp. rate 18, height 5\' 5"  (1.651 m), weight 111.403 kg (245 lb 9.6 oz), SpO2 97 %.  PHYSICAL EXAMINATION:  Physical Exam  GENERAL:  29 y.o.-year-old obese patient sitting in the bed with no acute distress.  EYES: Pupils equal, round, reactive to light and accommodation. No scleral icterus. Extraocular muscles intact.  HEENT: Head atraumatic, normocephalic. Oropharynx and nasopharynx clear.  NECK:  Supple, no jugular venous distention. No thyroid enlargement, no tenderness.  LUNGS: Normal breath sounds bilaterally, no  wheezing, rales,rhonchi or crepitation. No use of accessory muscles of respiration.  CARDIOVASCULAR: S1, S2 normal. No murmurs, rubs, or gallops.  ABDOMEN: Soft, nontender, nondistended. Bowel sounds present. No organomegaly or mass.  EXTREMITIES: 1+ pedal edema, right greater than left, no cyanosis, or clubbing.  NEUROLOGIC: Cranial nerves II through XII are intact. Muscle strength 5/5 in all extremities. Sensation intact. Gait not checked.  PSYCHIATRIC: The patient is alert and oriented x 3.  SKIN: No obvious rash, lesion, or ulcer.    LABORATORY PANEL:   CBC  Recent Labs Lab 08/18/14 0217  WBC 8.0  HGB 10.6*  HCT 33.3*  PLT 160   ------------------------------------------------------------------------------------------------------------------  Chemistries   Recent Labs Lab 08/16/14 1509 08/17/14 0220  NA 139 138  K 4.2 3.7  CL 108 105  CO2 27 23  GLUCOSE 107* 118*  BUN 8 8  CREATININE 0.60 0.66  CALCIUM 8.7* 8.0*  AST 18  --   ALT 21  --   ALKPHOS 48  --   BILITOT 0.5  --    ------------------------------------------------------------------------------------------------------------------  Cardiac Enzymes  Recent Labs Lab 08/16/14 1509  TROPONINI <0.03   ------------------------------------------------------------------------------------------------------------------  RADIOLOGY:  Ct Angio Chest Pe W/cm &/or Wo Cm  08/16/2014   CLINICAL DATA:  Right chest pain, history of multiple PEs  EXAM: CT ANGIOGRAPHY CHEST WITH CONTRAST  TECHNIQUE: Multidetector CT imaging of the chest was performed using the standard protocol during bolus administration of intravenous contrast. Multiplanar CT image reconstructions and MIPs were obtained to evaluate the vascular anatomy.  CONTRAST:  100mL OMNIPAQUE IOHEXOL 350 MG/ML SOLN  COMPARISON:  CTA chest dated 05/11/2014  FINDINGS: Saddle pulmonary embolism at the bifurcation of the left and right main pulmonary arteries (series  5/image 92).  Majority of the thrombus extends into the right main pulmonary artery and is lodged at the takeoff of the right upper lobe pulmonary artery (series 5/ image 96) as well as the bifurcation of the right middle lobe and right lower lobe pulmonary arteries (series 5/ image 112), partially occlusive.  Elevated RV to LV ratio of 1.0, raising the possibility of right heart strain.  Overall clot burden is large.  Mediastinum/Nodes: The heart is normal in size. No pericardial effusion.  Small mediastinal lymph nodes, including an 11 mm short axis right paratracheal node (series 4/ image 39).  Visualized right thyroid is mildly heterogeneous/nodular.  Lungs/Pleura: Mild subpleural patchy opacities in the right lung.  Left lung is clear.  No suspicious pulmonary nodules.  No pleural effusion or pneumothorax.  Upper abdomen: Visualized upper abdomen is unremarkable.  Musculoskeletal: Visualized osseous structures are within normal limits.  Review of the MIP images confirms the above findings.  IMPRESSION: Saddle pulmonary embolism.  Majority of thrombus is lodged at the bifurcation of the right middle and lower lobe pulmonary arteries as well as the takeoff of the right upper lobe pulmonary artery, partially occlusive.  Overall clot burden is large.  Positive for acute PE with CT evidence of right heart strain (RV/LV Ratio = 1.0) consistent with at least submassive (intermediate risk) PE. The presence of right heart strain has been associated with an increased risk of morbidity and mortality.  Critical Value/emergent results were called by telephone at the time of interpretation on 08/16/2014 at 4:00 pm to Dr. Lenard Lance, who verbally acknowledged these results.   Electronically Signed   By: Charline Bills M.D.   On: 08/16/2014 16:02   US Venous Img Lower Bilateral  08/17/2014   CLINICAL DATA:  Pulmonary embolism. History of prior DVT and pulmonary embolism. History of smoking. Evaluate for acute or chronic  DVT.  EXAM: BILATERAL LOWER EXTREMITY VENOUS DOPPLER ULTRASOUND  TECHNIQUE: Gray-scale sonography with graded compression, as well as color Doppler and duplex ultrasound were performed to evaluate the lower extremity deep venous systems from the level of the common femoral vein and including the common femoral, femoral, profunda femoral, popliteal and calf veins including the posterior tibial, peroneal and gastrocnemius veins when visible. The superficial great saphenous vein was also interrogated. Spectral Doppler was utilized to evaluate flow at rest and with distal augmentation maneuvers in the common femoral, femoral and popliteal veins.  COMPARISON:  Right lower extremity venous Doppler ultrasound- 07/12/2014  FINDINGS: RIGHT LOWER EXTREMITY  Common Femoral Vein: There is hypoechoic nonocclusive thrombus within the right common femoral vein (representative images 7, 8 and 63), improved since the 06/2018 07/2014 examination.  Saphenofemoral Junction: No evidence of acute or chronic thrombus. Normal compressibility and flow on color Doppler imaging.  Profunda Femoral Vein: No evidence of acute or chronic thrombus. Normal compressibility and flow on color Doppler imaging.  Femoral Vein: There is hypoechoic nonocclusive thrombus within the mid aspect of the right femoral vein (representative images 10 and 66), improved since the 07/12/2014 examination.  Popliteal Vein: No evidence of acute or chronic thrombus. Normal compressibility, respiratory phasicity and response to augmentation.  Calf Veins: No evidence of thrombus. Normal compressibility and flow on color Doppler imaging.  Superficial Great Saphenous Vein: No evidence of thrombus. Normal compressibility and flow on color Doppler imaging.  Venous Reflux:  None.  Other  Findings:  None.  LEFT LOWER EXTREMITY  Common Femoral Vein: No evidence of thrombus. Normal compressibility, respiratory phasicity and response to augmentation.  Saphenofemoral Junction: No  evidence of thrombus. Normal compressibility and flow on color Doppler imaging.  Profunda Femoral Vein: No evidence of thrombus. Normal compressibility and flow on color Doppler imaging.  Femoral Vein: No evidence of thrombus. Normal compressibility, respiratory phasicity and response to augmentation.  Popliteal Vein: No evidence of thrombus. Normal compressibility, respiratory phasicity and response to augmentation.  Calf Veins: No evidence of thrombus. Normal compressibility and flow on color Doppler imaging.  Superficial Great Saphenous Vein: No evidence of thrombus. Normal compressibility and flow on color Doppler imaging.  Venous Reflux:  None.  Other Findings:  None.  IMPRESSION: 1. Overall improved appearance of previously noted extensive right lower extremity DVT with age-indeterminate (though potentially acute on chronic) nonocclusive DVT within the right common femoral and mid aspect of the right superficial femoral veins. 2. No evidence of acute or chronic DVT within the left lower extremity.   Electronically Signed   By: Simonne Come M.D.   On: 08/17/2014 17:29    EKG:   Orders placed or performed during the hospital encounter of 08/16/14  . ED EKG (<58mins upon arrival to the ED)  . ED EKG (<81mins upon arrival to the ED)  . EKG 12-Lead  . EKG 12-Lead    ASSESSMENT AND PLAN:   29 year old obese female with known history of recurrent DVTs and PE comes to the hospital secondary to chest pain and noted to have saddle embolus.  -#1 acute pulmonary embolism-patient with known history of PE and DVT, on Lovenox now coming with chest pain and CT chest noted to have acute pulmonary embolism. -She is noted to have saddle embolus with right heart strain. -Continue IV heparin at this time. She can be considered as Lovenox failure. As she has had recent DVT last month while on Lovenox 2. Outpatient hematology follow-up for further workup. Smoking cessation advice.  -Appreciate hematology  consult. - She cannot take Xarelto due to its side effects. Cannot get eliquis until her patient assistance program kicks in. Discussed about pradaxa, and patient wants to try that until eliquis can be restarted. -Before starting Pradaxa she needs to be on 5 days of systemic anticoagulation. To continue IV heparin until Friday and then discharge on pradaxa.  #2 chest pain -likely secondary to pulmonary embolism. Adjust her pain medications. Pulmonary has been consulted Vascular has seen the patient, and did not think that she will need thrombolytic lysis.  #3 DVT-recent right leg DVT 3 weeks ago, intervention done by vascular. Patient has history of IVC filter placement. No plans to place another one at this time. Appreciate vascular consult.  #4 anxiety-Klonopin  #5 GERD-on protonix  #6 tobacco use disorder-on nicotine patch.   All the records are reviewed and case discussed with Care Management/Social Workerr. Management plans discussed with the patient, family and they are in agreement.  CODE STATUS: Full code  TOTAL TIME TAKING CARE OF THIS PATIENT: 38 minutes.   POSSIBLE D/C ON FRIDAY, DEPENDING ON CLINICAL CONDITION.   Enid Baas M.D on 08/18/2014 at 3:13 PM  Between 7am to 6pm - Pager - [phone]  After 6pm go to [email or web address] - password [email or web address] [facility]  [place] [facility]  Office  [phone]  CC: Primary care physician; Delton Prairie, MD

## 2014-08-18 NOTE — Progress Notes (Signed)
[facility]  Daily Progress Note   Subjective  - * No surgery found *  Still complaining of a lot of pain in her chest. Leg not bothering her too bad. Breathing comfortably on room air for over 24 hours without hypoxia.  Objective Filed Vitals:   08/17/14 1936 08/18/14 0418 08/18/14 0942 08/18/14 1102  BP: 125/59 108/67 106/69 157/70  Pulse: 99 87 86 96  Temp: 99.2 F (37.3 C) 98.8 F (37.1 C) 98.3 F (36.8 C) 98.2 F (36.8 C)  TempSrc: Oral Oral Oral Oral  Resp: 23 22 18 18   Height:      Weight:  245 lb 9.6 oz (111.403 kg)    SpO2: 98% 97% 96%     Intake/Output Summary (Last 24 hours) at 08/18/14 1125 Last data filed at 08/18/14 0900  Gross per 24 hour  Intake 877.78 ml  Output    300 ml  Net 577.78 ml    PULM  CTAB CV  RRR VASC  mild swelling in the right leg  Laboratory CBC    Component Value Date/Time   WBC 8.0 08/18/2014 0217   WBC 6.4 07/15/2014 0503   HGB 10.6* 08/18/2014 0217   HGB 10.7* 07/15/2014 0503   HCT 33.3* 08/18/2014 0217   HCT 32.5* 07/15/2014 0503   PLT 160 08/18/2014 0217   PLT 166 07/15/2014 0503    BMET    Component Value Date/Time   NA 138 08/17/2014 0220   NA 136 07/14/2014 0458   K 3.7 08/17/2014 0220   K 3.6 07/14/2014 0458   CL 105 08/17/2014 0220   CL 104 07/14/2014 0458   CO2 23 08/17/2014 0220   CO2 26 07/14/2014 0458   GLUCOSE 118* 08/17/2014 0220   GLUCOSE 142* 07/14/2014 0458   BUN 8 08/17/2014 0220   BUN 15 07/14/2014 0458   CREATININE 0.66 08/17/2014 0220   CREATININE 0.76 07/14/2014 0458   CALCIUM 8.0* 08/17/2014 0220   CALCIUM 8.2* 07/14/2014 0458   GFRNONAA >60 08/17/2014 0220   GFRNONAA >60 07/14/2014 0458   GFRAA >60 08/17/2014 0220   GFRAA >60 07/14/2014 0458    Assessment/Planning: Pulmonary embolus: Doing well on a heparin drip. No role for thrombolyzes in a stable patient not requiring oxygen.   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 Hematology has seen and has  recommended Eliquis which seems very reasonable. May need social work assistance as previously Eliquis was prohibitively expensive.  Tobacco dependence. Likely increasing thrombotic potential.  No new recs from vascular point of view at this time.    [name]  08/18/2014, 11:25 AM

## 2014-08-18 NOTE — Care Management Note (Signed)
Case Management Note  Patient Details  Name: [name] MRN: [id number] Date of Birth: 01/30/1986   Current plan is to continue Heparin IV until Friday 08/20/14 and then discharge home on Pradaxa until [employer] begins.                      Expected Discharge Date:                  Expected Discharge Plan:     In-House Referral:     Discharge planning Services     Post Acute Care Choice:    Choice offered to:     DME Arranged:    DME Agency:     HH Arranged:    HH Agency:     Status of Service:     Medicare Important Message Given:    Date Medicare IM Given:    Medicare IM give by:    Date Additional Medicare IM Given:    Additional Medicare Important Message give by:     If discussed at Long Length of Stay Meetings, dates discussed:    Additional Comments:  Shallen Luedke A, RN 08/18/2014, 3:23 PM

## 2014-08-19 LAB — URINALYSIS COMPLETE WITH MICROSCOPIC (ARMC ONLY)
Bilirubin Urine: NEGATIVE
Glucose, UA: NEGATIVE mg/dL
KETONES UR: NEGATIVE mg/dL
Leukocytes, UA: NEGATIVE
Nitrite: POSITIVE — AB
Protein, ur: 30 mg/dL — AB
Specific Gravity, Urine: 1.018 (ref 1.005–1.030)
pH: 6 (ref 5.0–8.0)

## 2014-08-19 LAB — CBC
HCT: 33.4 % — ABNORMAL LOW (ref 35.0–47.0)
Hemoglobin: 10.8 g/dL — ABNORMAL LOW (ref 12.0–16.0)
MCH: 27.3 pg (ref 26.0–34.0)
MCHC: 32.5 g/dL (ref 32.0–36.0)
MCV: 84 fL (ref 80.0–100.0)
Platelets: 171 10*3/uL (ref 150–440)
RBC: 3.97 MIL/uL (ref 3.80–5.20)
RDW: 16 % — AB (ref 11.5–14.5)
WBC: 10.7 10*3/uL (ref 3.6–11.0)

## 2014-08-19 LAB — HEPARIN LEVEL (UNFRACTIONATED)
Heparin Unfractionated: 0.35 IU/mL (ref 0.30–0.70)
Heparin Unfractionated: 0.34 IU/mL (ref 0.30–0.70)

## 2014-08-19 NOTE — Progress Notes (Signed)
[facility] - [place] at [facility]   PATIENT NAME: [name]    MR#:  [id number]  DATE OF BIRTH:  April 15, 1985  SUBJECTIVE:  CHIEF COMPLAINT:   Chief Complaint  Patient presents with  . Chest Pain    pain with inspiration   - continues to complain of right-sided chest pain. -Spiked fever last evening and early this morning.  REVIEW OF SYSTEMS:  Review of Systems  Constitutional: Negative for fever and chills.  Respiratory: Positive for cough and shortness of breath. Negative for wheezing.   Cardiovascular: Positive for chest pain. Negative for palpitations.       Right-sided chest pain and pleurisy.  Gastrointestinal: Negative for nausea, vomiting, abdominal pain, diarrhea and constipation.  Genitourinary: Negative for dysuria.  Musculoskeletal: Positive for myalgias.  Neurological: Negative for dizziness, seizures and headaches.    DRUG ALLERGIES:   Allergies  Allergen Reactions  . Penicillins Other (See Comments)    Reaction:  Unknown  . Xarelto [Rivaroxaban] Other (See Comments)    Reaction: Pt said it "made me feel like I was drunk."    VITALS:  Blood pressure 144/83, pulse 112, temperature 100 F (37.8 C), temperature source Oral, resp. rate 18, height  (1.651 m), weight 110.542 kg (243 lb 11.2 oz), SpO2 95 %.  PHYSICAL EXAMINATION:  Physical Exam  GENERAL:  29 y.o.-year-old obese patient sitting in the bed with no acute distress.  EYES: Pupils equal, round, reactive to light and accommodation. No scleral icterus. Extraocular muscles intact.  HEENT: Head atraumatic, normocephalic. Oropharynx and nasopharynx clear.  NECK:  Supple, no jugular venous distention. No thyroid enlargement, no tenderness.  LUNGS: Normal breath sounds bilaterally, no wheezing, rales,rhonchi or crepitation. No use of accessory muscles of respiration.  CARDIOVASCULAR: S1, S2 normal. No murmurs, rubs, or gallops.  ABDOMEN: Soft, nontender, nondistended. Bowel  sounds present. No organomegaly or mass.  EXTREMITIES: 1+ pedal edema, right greater than left, no cyanosis, or clubbing.  NEUROLOGIC: Cranial nerves II through XII are intact. Muscle strength 5/5 in all extremities. Sensation intact. Gait not checked.  PSYCHIATRIC: The patient is alert and oriented x 3.  SKIN: No obvious rash, lesion, or ulcer.    LABORATORY PANEL:   CBC  Recent Labs Lab 08/19/14 0337  WBC 10.7  HGB 10.8*  HCT 33.4*  PLT 171   ------------------------------------------------------------------------------------------------------------------  Chemistries   Recent Labs Lab 08/16/14 1509 08/17/14 0220  NA 139 138  K 4.2 3.7  CL 108 105  CO2 27 23  GLUCOSE 107* 118*  BUN 8 8  CREATININE 0.60 0.66  CALCIUM 8.7* 8.0*  AST 18  --   ALT 21  --   ALKPHOS 48  --   BILITOT 0.5  --    ------------------------------------------------------------------------------------------------------------------  Cardiac Enzymes  Recent Labs Lab 08/16/14 1509  TROPONINI <0.03   ------------------------------------------------------------------------------------------------------------------  RADIOLOGY:  US Venous Img Lower Bilateral  08/17/2014   CLINICAL DATA:  Pulmonary embolism. History of prior DVT and pulmonary embolism. History of smoking. Evaluate for acute or chronic DVT.  EXAM: BILATERAL LOWER EXTREMITY VENOUS DOPPLER ULTRASOUND  TECHNIQUE: Gray-scale sonography with graded compression, as well as color Doppler and duplex ultrasound were performed to evaluate the lower extremity deep venous systems from the level of the common femoral vein and including the common femoral, femoral, profunda femoral, popliteal and calf veins including the posterior tibial, peroneal and gastrocnemius veins when visible. The superficial great saphenous vein was also interrogated. Spectral Doppler was utilized to evaluate  flow at rest and with distal augmentation maneuvers in the  common femoral, femoral and popliteal veins.  COMPARISON:  Right lower extremity venous Doppler ultrasound- 07/12/2014  FINDINGS: RIGHT LOWER EXTREMITY  Common Femoral Vein: There is hypoechoic nonocclusive thrombus within the right common femoral vein (representative images 7, 8 and 63), improved since the 06/2018 07/2014 examination.  Saphenofemoral Junction: No evidence of acute or chronic thrombus. Normal compressibility and flow on color Doppler imaging.  Profunda Femoral Vein: No evidence of acute or chronic thrombus. Normal compressibility and flow on color Doppler imaging.  Femoral Vein: There is hypoechoic nonocclusive thrombus within the mid aspect of the right femoral vein (representative images 10 and 66), improved since the 07/12/2014 examination.  Popliteal Vein: No evidence of acute or chronic thrombus. Normal compressibility, respiratory phasicity and response to augmentation.  Calf Veins: No evidence of thrombus. Normal compressibility and flow on color Doppler imaging.  Superficial Great Saphenous Vein: No evidence of thrombus. Normal compressibility and flow on color Doppler imaging.  Venous Reflux:  None.  Other Findings:  None.  LEFT LOWER EXTREMITY  Common Femoral Vein: No evidence of thrombus. Normal compressibility, respiratory phasicity and response to augmentation.  Saphenofemoral Junction: No evidence of thrombus. Normal compressibility and flow on color Doppler imaging.  Profunda Femoral Vein: No evidence of thrombus. Normal compressibility and flow on color Doppler imaging.  Femoral Vein: No evidence of thrombus. Normal compressibility, respiratory phasicity and response to augmentation.  Popliteal Vein: No evidence of thrombus. Normal compressibility, respiratory phasicity and response to augmentation.  Calf Veins: No evidence of thrombus. Normal compressibility and flow on color Doppler imaging.  Superficial Great Saphenous Vein: No evidence of thrombus. Normal compressibility and flow  on color Doppler imaging.  Venous Reflux:  None.  Other Findings:  None.  IMPRESSION: 1. Overall improved appearance of previously noted extensive right lower extremity DVT with age-indeterminate (though potentially acute on chronic) nonocclusive DVT within the right common femoral and mid aspect of the right superficial femoral veins. 2. No evidence of acute or chronic DVT within the left lower extremity.   Electronically Signed   By: Simonne Come M.D.   On: 08/17/2014 17:29    EKG:   Orders placed or performed during the hospital encounter of 08/16/14  . ED EKG (<39mins upon arrival to the ED)  . ED EKG (<83mins upon arrival to the ED)  . EKG 12-Lead  . EKG 12-Lead    ASSESSMENT AND PLAN:   29 year old obese female with known history of recurrent DVTs and PE comes to the hospital secondary to chest pain and noted to have saddle embolus.  -#1 acute on chronic pulmonary embolism-patient with known history of PE and DVT, on Lovenox now coming with chest pain and CT chest noted to have acute pulmonary embolism. -She is noted to have saddle embolus with right heart strain. -Continue IV heparin at this time. She can be considered as Lovenox failure. As she has had recent DVT last month while on Lovenox too. Outpatient hematology follow-up for further workup. Smoking cessation advice.  - per hematology,. Patient had prior upper coagulable workup done which was negative twice in the past. -Appreciate hematology consult. - She cannot take Xarelto due to its side effects. Cannot get eliquis until her patient assistance program kicks in. Discussed about pradaxa, and patient wants to try that until eliquis can be restarted. -Before starting Pradaxa she needs to be on 5 days of systemic anticoagulation. To continue IV heparin until Friday/saturday and  then discharge on pradaxa. -Discussed plan with Dr. Orlie DakinFinnegan. Patient will need [facility]  #2 chest pain -  pleurisy--likely secondary to pulmonary embolism. Adjust her pain medications. Pulmonary has been consulted Vascular has seen the patient, and did not think that she will need thrombolytic lysis.  #3 DVT-recent right leg DVT 3 weeks ago, intervention done by vascular. Patient has history of IVC filter placement. No plans to place another one at this time. Appreciate vascular consult.  #4 Fever- Unknown source at this time. Ordered for blood cultures 2, also UA today. We'll hold off on anti-biotics at this time. But if there are further spikes of fever noted, we'll start on anti-biotics  #5 GERD-on protonix  #6 tobacco use disorder-on nicotine patch.   All the records are reviewed and case discussed with Care Management/Social Workerr. Management plans discussed with the patient, family and they are in agreement.  CODE STATUS: Full code  TOTAL TIME TAKING CARE OF THIS PATIENT: 38 minutes.   POSSIBLE D/C IN 1-2 DAYS, DEPENDING ON CLINICAL CONDITION.   Enid BaasKALISETTI,Brenan Modesto M.D on 08/19/2014 at 10:37 AM  Between 7am to 6pm - Pager - [phone]  After 6pm go to [email or web address] - password [email or web address] [facility]  [facility]  Office  [phone]  CC: Primary care physician; Delton Prairieobin, Paul, MD

## 2014-08-19 NOTE — Care Management (Signed)
Completed and [facility]ed [facility]  REceived confirmation of fax.  Placed copy of application in patient medical record.  Gave patient's mother a copy so can contact drug company to follow up  application process.  Provided patient's mother with Pradaxa saving coupon for 30 day free.  Will fax patient's Pr[facility].  CM was informed [facility] that Pradaxa IS on the pharmacy assistance program formulary.  Continue to anticipate patient to transition to Eliquis from the Pradaxa if receives approval for financial assist for the Eliquis

## 2014-08-19 NOTE — Care Management (Signed)
Per request of [facility] patient assist- need to have script written for 90 days.  Obtained and faxed

## 2014-08-19 NOTE — Progress Notes (Signed)
Med admin at 2031

## 2014-08-19 NOTE — Progress Notes (Signed)
Pt alert and verbally responsive, frequent request for pain medication, increase temp during this shift, effective with tylenol, Md notified regarding increase temp no new orders given. Pt sitting up in bed at this time asleep, family member at this time.

## 2014-08-19 NOTE — Progress Notes (Addendum)
ANTICOAGULATION CONSULT NOTE - Follow Up Consult  Pharmacy Consult for Heparin Drip Indication: pulmonary embolus  Allergies  Allergen Reactions  . Penicillins Other (See Comments)    Reaction:  Unknown  . Xarelto [Rivaroxaban] Other (See Comments)    Reaction: Pt said it "made me feel like I was drunk."    Patient Measurements: Height: 5\' 5"  (165.1 cm) Weight: 243 lb 11.2 oz (110.542 kg) IBW/kg (Calculated) : 57 Heparin Dosing Weight: 82.7 kg  Vital Signs: Temp: 98.6 F (37 C) (06/02 1126) Temp Source: Oral (06/02 1126) BP: 121/74 mmHg (06/02 1126) Pulse Rate: 115 (06/02 1126)  Labs:  Recent Labs  08/16/14 1509 08/17/14 0220  08/18/14 0217  08/18/14 1945 08/19/14 0136 08/19/14 0337  HGB 12.1 11.1*  --  10.6*  --   --   --  10.8*  HCT 37.2 33.9*  --  33.3*  --   --   --  33.4*  PLT 167 152  --  160  --   --   --  171  APTT 31  --   --   --   --   --   --   --   LABPROT 13.4  --   --   --   --   --   --   --   INR 1.00  --   --   --   --   --   --   --   HEPARINUNFRC  --  <0.10*  < > 0.29*  < > 0.42 0.35 0.34  CREATININE 0.60 0.66  --   --   --   --   --   --   TROPONINI <0.03  --   --   --   --   --   --   --   < > = values in this interval not displayed.  Estimated Creatinine Clearance: 127.3 mL/min (by C-G formula based on Cr of 0.66).   Medications:  Scheduled:  . clonazePAM  0.5 mg Oral BID  . nicotine  14 mg Transdermal Daily  . pantoprazole  40 mg Oral Daily   Infusions:  . heparin 2,550 Units/hr (08/19/14 0007)    Assessment: Patient continues on Heparin Drip at 2550 units/hr and last HL was in range. Patient failed outpatient therapy with Lovenox and is waiting for patient assistance with Eliquis.   Goal of Therapy:  Heparin level 0.3-0.7 units/ml Monitor platelets by anticoagulation protocol: Yes   Plan:  Continue Heparin drip at 2550 units/hr. Check HL and CBC with am labs. Plan is to continue Heparin for 5 days then switch to  Pradaxa.  Clovia CuffLisa Tashanna Dolin, PharmD, BCPS 08/19/2014 11:44 AM    6/1 AM anti-Xa 0.17. 2500 unit bolus and increase rate to 2850 units/hr. Recheck at 12:00.  Fulton ReekMatt McBane, PharmD, BCPS  08/20/2014

## 2014-08-20 LAB — CBC
HEMATOCRIT: 32.4 % — AB (ref 35.0–47.0)
Hemoglobin: 10.2 g/dL — ABNORMAL LOW (ref 12.0–16.0)
MCH: 26.5 pg (ref 26.0–34.0)
MCHC: 31.5 g/dL — ABNORMAL LOW (ref 32.0–36.0)
MCV: 83.9 fL (ref 80.0–100.0)
Platelets: 181 10*3/uL (ref 150–440)
RBC: 3.86 MIL/uL (ref 3.80–5.20)
RDW: 16 % — ABNORMAL HIGH (ref 11.5–14.5)
WBC: 12.7 10*3/uL — ABNORMAL HIGH (ref 3.6–11.0)

## 2014-08-20 LAB — HEPARIN LEVEL (UNFRACTIONATED)
HEPARIN UNFRACTIONATED: 0.21 [IU]/mL — AB (ref 0.30–0.70)
Heparin Unfractionated: 0.17 IU/mL — ABNORMAL LOW (ref 0.30–0.70)
Heparin Unfractionated: 0.3 IU/mL (ref 0.30–0.70)

## 2014-08-20 MED ORDER — CIPROFLOXACIN HCL 250 MG PO TABS
250.0000 mg | ORAL_TABLET | Freq: Two times a day (BID) | ORAL | Status: DC
  Administered 2014-08-20 – 2014-08-21 (×3): 250 mg via ORAL
  Filled 2014-08-20 (×3): qty 1

## 2014-08-20 MED ORDER — HEPARIN BOLUS VIA INFUSION
2500.0000 [IU] | Freq: Once | INTRAVENOUS | Status: AC
  Administered 2014-08-20: 2500 [IU] via INTRAVENOUS
  Filled 2014-08-20: qty 2500

## 2014-08-20 MED ORDER — HEPARIN BOLUS VIA INFUSION
1200.0000 [IU] | Freq: Once | INTRAVENOUS | Status: AC
Start: 2014-08-20 — End: 2014-08-20
  Administered 2014-08-20: 1200 [IU] via INTRAVENOUS
  Filled 2014-08-20: qty 1200

## 2014-08-20 NOTE — Progress Notes (Signed)
[facility] [place] [facility]   PATIENT NAME: [name]    MR#:  [id number]  DATE OF BIRTH:  10/24/1985  SUBJECTIVE:  CHIEF COMPLAINT:   Chief Complaint  Patient presents with  . Chest Pain    pain with inspiration   - Sitting in chair, appears comfortable. - but still complains of pain on right side of chest. - on day 4 of IV heparin- plan to discharge on pradaxa tomorrow  REVIEW OF SYSTEMS:  Review of Systems  Constitutional: Negative for fever and chills.  Respiratory: Positive for cough and shortness of breath. Negative for wheezing.   Cardiovascular: Positive for chest pain. Negative for palpitations.       Right-sided chest pain and pleurisy.   Gastrointestinal: Negative for nausea, vomiting, abdominal pain, diarrhea and constipation.  Genitourinary: Negative for dysuria.  Neurological: Negative for dizziness, seizures and headaches.    DRUG ALLERGIES:   Allergies  Allergen Reactions  . Penicillins Other (See Comments)    Reaction:  Unknown  . Xarelto [Rivaroxaban] Other (See Comments)    Reaction: Pt said it "made me feel like I was drunk."    VITALS:  Blood pressure 132/78, pulse 117, temperature 98.8 F (37.1 C), temperature source Oral, resp. rate 18, height 5\' 5"  (1.651 m), weight 109.408 kg (241 lb 3.2 oz), SpO2 94 %.  PHYSICAL EXAMINATION:  Physical Exam  GENERAL:  29 y.o.-year-old obese patient sitting in the bed with no acute distress.  EYES: Pupils equal, round, reactive to light and accommodation. No scleral icterus. Extraocular muscles intact.  HEENT: Head atraumatic, normocephalic. Oropharynx and nasopharynx clear.  NECK:  Supple, no jugular venous distention. No thyroid enlargement, no tenderness.  LUNGS: Normal breath sounds bilaterally, no wheezing, rales,rhonchi or crepitation. No use of accessory muscles of respiration.  CARDIOVASCULAR: S1, S2 normal. No murmurs, rubs, or gallops.  ABDOMEN: Soft, nontender,  nondistended. Bowel sounds present. No organomegaly or mass.  EXTREMITIES: 1+ pedal edema, right greater than left, no cyanosis, or clubbing.  NEUROLOGIC: Cranial nerves II through XII are intact. Muscle strength 5/5 in all extremities. Sensation intact. Gait not checked.  PSYCHIATRIC: The patient is alert and oriented x 3.  SKIN: No obvious rash, lesion, or ulcer.    LABORATORY PANEL:   CBC  Recent Labs Lab 08/20/14 0400  WBC 12.7*  HGB 10.2*  HCT 32.4*  PLT 181   ------------------------------------------------------------------------------------------------------------------  Chemistries   Recent Labs Lab 08/16/14 1509 08/17/14 0220  NA 139 138  K 4.2 3.7  CL 108 105  CO2 27 23  GLUCOSE 107* 118*  BUN 8 8  CREATININE 0.60 0.66  CALCIUM 8.7* 8.0*  AST 18  --   ALT 21  --   ALKPHOS 48  --   BILITOT 0.5  --    ------------------------------------------------------------------------------------------------------------------  Cardiac Enzymes  Recent Labs Lab 08/16/14 1509  TROPONINI <0.03   ------------------------------------------------------------------------------------------------------------------  RADIOLOGY:  No results found.  EKG:   Orders placed or performed during the hospital encounter of 08/16/14  . ED EKG (<7310mins upon arrival to the ED)  . ED EKG (<5210mins upon arrival to the ED)  . EKG 12-Lead  . EKG 12-Lead    ASSESSMENT AND PLAN:   29 year old obese female with known history of recurrent DVTs and PE comes to the hospital secondary to chest pain and noted to have saddle embolus.  -#1 acute on chronic pulmonary embolism-patient with known history of PE and DVT, on Lovenox now coming with  chest pain and CT chest noted to have acute pulmonary embolism. -She is noted to have saddle embolus with right heart strain. -Continue IV heparin at this time. She can be considered as Lovenox failure. As she has had recent DVT last month while on  Lovenox too. Outpatient hematology follow-up for further workup. Smoking cessation advice.  - per hematology,. Patient had prior upper coagulable workup done which was negative twice in the past. -Appreciate hematology consult. - She cannot take Xarelto due to its side effects. Cannot get eliquis until her patient assistance program kicks in. Discussed about pradaxa, and patient wants to try that until eliquis can be restarted. -Before starting Pradaxa she needs to be on 5 days of systemic anticoagulation. To continue IV heparin until saturday and then discharge on pradaxa. -Discussed plan with Dr. Orlie Dakin. Patient will need to follow up with her hematologist Dr. Isaiah Serge at [facility]  #2 chest pain - pleurisy--likely secondary to pulmonary embolism. Adjust her pain medications. Pulmonary has been consulted Vascular has seen the patient, and did not think that she will need thrombolytic lysis.  #3 DVT-recent right leg DVT 3 weeks ago, intervention done by vascular. Patient has history of IVC filter placement. No plans to place another one at this time. Appreciate vascular consult.  #4 Fever- high fevers spiked yesterday, Blood cultures done and pending. UA is likely impressive for UTI - will add cipro x 5 days - no further fevers now  #5 GERD-on protonix  #6 tobacco use disorder-on nicotine patch. This admission patient is not trying to get off the floor to go out and smoke   All the records are reviewed and case discussed with Care Management/Social Workerr. Management plans discussed with the patient, family and they are in agreement.  CODE STATUS: Full code  TOTAL TIME TAKING CARE OF THIS PATIENT: 38 minutes.   POSSIBLE D/C TOMORROW, DEPENDING ON CLINICAL CONDITION. UPDATED MOTHER AND PATIENT AT [name] M.D on 08/20/2014 at 10:47 AM  Between 7am to 6pm - Pager - [phone]  After 6pm go to [email or web address] - password [email or web address]  [facility] [place] [facility]   Office  [phone]  CC: Primary care physician; Delton Prairie, MD

## 2014-08-20 NOTE — Care Management (Signed)
Provided patient with Pradaxa 30 day coupon.  [facility] and requested confirmation of receipt.

## 2014-08-20 NOTE — Progress Notes (Addendum)
ANTICOAGULATION CONSULT NOTE - Follow Up Consult  Pharmacy Consult for Heparin Indication: pulmonary embolus  Allergies  Allergen Reactions  . Penicillins Other (See Comments)    Reaction:  Unknown  . Xarelto [Rivaroxaban] Other (See Comments)    Reaction: Pt said it "made me feel like I was drunk."    Patient Measurements: Height: 5\' 5"  (165.1 cm) Weight: 241 lb 3.2 oz (109.408 kg) IBW/kg (Calculated) : 57 Heparin Dosing Weight: 82.7 kg  Vital Signs: Temp: 97.3 F (36.3 C) (06/03 1143) Temp Source: Oral (06/03 1143) BP: 129/72 mmHg (06/03 1143) Pulse Rate: 105 (06/03 1143)  Labs:  Recent Labs  08/18/14 0217  08/19/14 0337 08/20/14 0400 08/20/14 1229 08/20/14 1818  HGB 10.6*  --  10.8* 10.2*  --   --   HCT 33.3*  --  33.4* 32.4*  --   --   PLT 160  --  171 181  --   --   HEPARINUNFRC 0.29*  < > 0.34 0.17* 0.30 0.21*  < > = values in this interval not displayed.  Estimated Creatinine Clearance: 126.6 mL/min (by C-G formula based on Cr of 0.66).   Medications:  Scheduled:  . ciprofloxacin  250 mg Oral BID  . clonazePAM  0.5 mg Oral BID  . heparin  1,200 Units Intravenous Once  . nicotine  14 mg Transdermal Daily  . pantoprazole  40 mg Oral Daily   Infusions:  . heparin 2,850 Units/hr (08/20/14 1117)   PRN: acetaminophen **OR** acetaminophen, albuterol, cyclobenzaprine, HYDROcodone-acetaminophen, morphine injection, ondansetron **OR** ondansetron (ZOFRAN) IV  Assessment: Patient on heparin drip for PE with HL below goal.   Goal of Therapy:  Heparin level 0.3-0.7 units/ml Monitor platelets by anticoagulation protocol: Yes   Plan:  Will bolus heparin 1200 units iv and increase infusion to 3000 unit/hr. Will recheck HL in 6 h.   Luisa HartChristy, Scott D 08/20/2014,7:25 PM    6/4 0130 anti-Xa 0.18. 2500 unit bolus and increase rate to 3300 units/hr. Recheck at 10:00.  Fulton ReekMatt Jaquese Irving, PharmD, BCPS  08/21/2014

## 2014-08-20 NOTE — Care Management (Signed)
Instructed patient's mother to activate the Pradaxa 30 day coupon today and inform CM if there are any problems.  [facility].  At first informed that patient was not enrolled in the program but when CM reminded them patient's last name may be Suzie Portelaayne in their records, patient's information was found.  Informed that if patient wished to have medication picked up Saturday- the pharmacy is open until 3pm.  Informed primary nurse and patient.  Primary nurse says that the Pradaxa coupon was activated.  Family can use the coupon at local drug store

## 2014-08-20 NOTE — Progress Notes (Signed)
ANTICOAGULATION CONSULT NOTE - Follow Up Consult  Pharmacy Consult for Heparin Drip Indication: pulmonary embolus  Allergies  Allergen Reactions  . Penicillins Other (See Comments)    Reaction:  Unknown  . Xarelto [Rivaroxaban] Other (See Comments)    Reaction: Pt said it "made me feel like I was drunk."    Patient Measurements: Height: 5\' 5"  (165.1 cm) Weight: 241 lb 3.2 oz (109.408 kg) IBW/kg (Calculated) : 57 Heparin Dosing Weight: 82.7 kg  Vital Signs: Temp: 97.3 F (36.3 C) (06/03 1143) Temp Source: Oral (06/03 1143) BP: 129/72 mmHg (06/03 1143) Pulse Rate: 105 (06/03 1143)  Labs:  Recent Labs  08/18/14 0217  08/19/14 0337 08/20/14 0400 08/20/14 1229  HGB 10.6*  --  10.8* 10.2*  --   HCT 33.3*  --  33.4* 32.4*  --   PLT 160  --  171 181  --   HEPARINUNFRC 0.29*  < > 0.34 0.17* 0.30  < > = values in this interval not displayed.  Estimated Creatinine Clearance: 126.6 mL/min (by C-G formula based on Cr of 0.66).   Medications:  Scheduled:  . ciprofloxacin  250 mg Oral BID  . clonazePAM  0.5 mg Oral BID  . nicotine  14 mg Transdermal Daily  . pantoprazole  40 mg Oral Daily   Infusions:  . heparin 2,850 Units/hr (08/20/14 1117)    Assessment: Patient continues on Heparin Drip for PE. Patient failed outpatient therapy with Lovenox and is waiting for patient assistance with Eliquis.   Goal of Therapy:  Heparin level 0.3-0.7 units/ml Monitor platelets by anticoagulation protocol: Yes   Plan:  Check CBC with am labs. Plan is to continue Heparin for 5 days then switch to Pradaxa.  6/3 AM anti-Xa 0.17. 2500 unit bolus and increase rate to 2850 units/hr. Recheck at 12:00. 6/3 1200 AntiXa= 0.30.  Will continue at current rate of 2850 units/hr and recheck in 6 hrs.  Bari MantisKristin Pearson Picou PharmD Clinical Pharmacist 08/20/2014

## 2014-08-20 NOTE — Progress Notes (Signed)
Initial Nutrition Assessment  DOCUMENTATION CODES:     INTERVENTION:   (Meals and Snacks: Cater to patient preferences)  NUTRITION DIAGNOSIS:   (No nutrition concerns at this time)   GOAL:  Patient will meet greater than or equal to 90% of their needs  MONITOR:   (Energy Intake)  REASON FOR ASSESSMENT:   (RD Screen- LOS)    ASSESSMENT:  Reason For Admission: PE PMHx:  Past Medical History  Diagnosis Date  . Pulmonary embolism   . Collagen vascular disease     HX: RT blood clot in leg, PE    Typical Fluid/ Food Intake: 100% of meals recorded per I/O Meal/ Snack Patterns: Patient reports a good appetite and intake of a regular diet with no restrictions PTA.  Supplements: None  Labs:  Electrolyte and Renal Profile:  Recent Labs Lab 08/16/14 1509 08/17/14 0220  BUN 8 8  CREATININE 0.60 0.66  NA 139 138  K 4.2 3.7   Protein Profile:  Recent Labs Lab 08/16/14 1509  ALBUMIN 3.7    Meds: reviewed  Physical Findings: n/a Weight Changes: Patient reports a UBW of "240-something". Current weight is 241#. No significant weight changes to note.   Height:  Ht Readings from Last 1 Encounters:  08/16/14 5\' 5"  (1.651 m)    Weight:  Wt Readings from Last 1 Encounters:  08/20/14 241 lb 3.2 oz (109.408 kg)    Ideal Body Weight:     Wt Readings from Last 10 Encounters:  08/20/14 241 lb 3.2 oz (109.408 kg)    BMI:  Body mass index is 40.14 kg/(m^2).  Skin:  Reviewed, no issues  Diet Order:  Diet regular Room service appropriate?: Yes; Fluid consistency:: Thin  EDUCATION NEEDS:  No education needs identified at this time   Intake/Output Summary (Last 24 hours) at 08/20/14 1044 Last data filed at 08/20/14 0830  Gross per 24 hour  Intake 660.33 ml  Output   1600 ml  Net -939.67 ml    Last BM:  6/2  Joeseph Amorracey L. Gaines, RDN Pager: [phone] Office: [phone]  LOW Care Level

## 2014-08-21 LAB — CBC
HEMATOCRIT: 29.8 % — AB (ref 35.0–47.0)
Hemoglobin: 9.5 g/dL — ABNORMAL LOW (ref 12.0–16.0)
MCH: 26.8 pg (ref 26.0–34.0)
MCHC: 32 g/dL (ref 32.0–36.0)
MCV: 83.6 fL (ref 80.0–100.0)
PLATELETS: 202 10*3/uL (ref 150–440)
RBC: 3.57 MIL/uL — ABNORMAL LOW (ref 3.80–5.20)
RDW: 16.4 % — AB (ref 11.5–14.5)
WBC: 9.8 10*3/uL (ref 3.6–11.0)

## 2014-08-21 LAB — HEPARIN LEVEL (UNFRACTIONATED): HEPARIN UNFRACTIONATED: 0.18 [IU]/mL — AB (ref 0.30–0.70)

## 2014-08-21 MED ORDER — ACETAMINOPHEN 325 MG PO TABS: 650.0000 mg | ORAL_TABLET | Freq: Four times a day (QID) | ORAL | Status: DC | PRN

## 2014-08-21 MED ORDER — NICOTINE 14 MG/24HR TD PT24: 14.0000 mg | MEDICATED_PATCH | Freq: Every day | TRANSDERMAL | Status: DC

## 2014-08-21 MED ORDER — HEPARIN BOLUS VIA INFUSION
2500.0000 [IU] | Freq: Once | INTRAVENOUS | Status: AC
  Administered 2014-08-21: 2500 [IU] via INTRAVENOUS
  Filled 2014-08-21: qty 2500

## 2014-08-21 MED ORDER — DABIGATRAN ETEXILATE MESYLATE 150 MG PO CAPS: 150.0000 mg | ORAL_CAPSULE | Freq: Once | ORAL | Status: DC

## 2014-08-21 MED ORDER — DABIGATRAN ETEXILATE MESYLATE 150 MG PO CAPS: 150.0000 mg | ORAL_CAPSULE | Freq: Two times a day (BID) | ORAL | Status: DC

## 2014-08-21 MED ORDER — CIPROFLOXACIN HCL 250 MG PO TABS
250.0000 mg | ORAL_TABLET | Freq: Two times a day (BID) | ORAL | Status: AC
Start: 2014-08-21 — End: 2014-08-25

## 2014-08-21 MED ORDER — HYDROCODONE-ACETAMINOPHEN 5-325 MG PO TABS: 1.0000 | ORAL_TABLET | Freq: Four times a day (QID) | ORAL | Status: DC | PRN

## 2014-08-21 MED ORDER — DABIGATRAN ETEXILATE MESYLATE 75 MG PO CAPS
150.0000 mg | ORAL_CAPSULE | Freq: Once | ORAL | Status: AC
Start: 2014-08-21 — End: 2014-08-21
  Administered 2014-08-21: 150 mg via ORAL
  Filled 2014-08-21: qty 2

## 2014-08-21 NOTE — Progress Notes (Signed)
Pt discharged to home.  Education done regarding Pulmonary Embolism, Smoking Cessation and new medication pradaxa.  Pt voiced understanding.  No questions or concerns at this time.  Tele and IV removed. Cristela FeltHelen Iris Guidry, RN

## 2014-08-24 ENCOUNTER — Telehealth: Payer: Self-pay | Admitting: *Deleted

## 2014-08-24 LAB — CULTURE, BLOOD (ROUTINE X 2)
Culture: NO GROWTH
Culture: NO GROWTH

## 2014-08-24 NOTE — Telephone Encounter (Signed)
Transferred call to scheduling

## 2014-08-25 ENCOUNTER — Encounter: Payer: Self-pay | Admitting: *Deleted

## 2014-08-25 ENCOUNTER — Emergency Department: Payer: Medicaid Other

## 2014-08-25 ENCOUNTER — Telehealth: Payer: Self-pay | Admitting: Oncology

## 2014-08-25 ENCOUNTER — Inpatient Hospital Stay
Admission: EM | Admit: 2014-08-25 | Discharge: 2014-08-27 | DRG: 175 | Disposition: A | Discharge status: Home / Self Care | Payer: Medicaid Other | Attending: Internal Medicine | Admitting: Internal Medicine

## 2014-08-25 DIAGNOSIS — Z888 Allergy status to other drugs, medicaments and biological substances status: Secondary | ICD-10-CM

## 2014-08-25 DIAGNOSIS — R042 Hemoptysis: Secondary | ICD-10-CM | POA: Diagnosis present

## 2014-08-25 DIAGNOSIS — R0781 Pleurodynia: Secondary | ICD-10-CM

## 2014-08-25 DIAGNOSIS — Z880 Allergy status to penicillin: Secondary | ICD-10-CM

## 2014-08-25 DIAGNOSIS — I2692 Saddle embolus of pulmonary artery without acute cor pulmonale: Principal | ICD-10-CM | POA: Diagnosis present

## 2014-08-25 DIAGNOSIS — E876 Hypokalemia: Secondary | ICD-10-CM | POA: Diagnosis present

## 2014-08-25 DIAGNOSIS — Z79899 Other long term (current) drug therapy: Secondary | ICD-10-CM

## 2014-08-25 DIAGNOSIS — Z90710 Acquired absence of both cervix and uterus: Secondary | ICD-10-CM

## 2014-08-25 DIAGNOSIS — J869 Pyothorax without fistula: Secondary | ICD-10-CM | POA: Diagnosis present

## 2014-08-25 DIAGNOSIS — Z9851 Tubal ligation status: Secondary | ICD-10-CM

## 2014-08-25 DIAGNOSIS — Z86718 Personal history of other venous thrombosis and embolism: Secondary | ICD-10-CM

## 2014-08-25 DIAGNOSIS — E669 Obesity, unspecified: Secondary | ICD-10-CM | POA: Diagnosis present

## 2014-08-25 DIAGNOSIS — Z86711 Personal history of pulmonary embolism: Secondary | ICD-10-CM

## 2014-08-25 DIAGNOSIS — D649 Anemia, unspecified: Secondary | ICD-10-CM | POA: Diagnosis present

## 2014-08-25 DIAGNOSIS — F17210 Nicotine dependence, cigarettes, uncomplicated: Secondary | ICD-10-CM | POA: Diagnosis present

## 2014-08-25 LAB — BASIC METABOLIC PANEL
Anion gap: 7 (ref 5–15)
BUN: 10 mg/dL (ref 6–20)
CHLORIDE: 105 mmol/L (ref 101–111)
CO2: 25 mmol/L (ref 22–32)
CREATININE: 0.7 mg/dL (ref 0.44–1.00)
Calcium: 8.2 mg/dL — ABNORMAL LOW (ref 8.9–10.3)
GFR calc Af Amer: >60 mL/min (ref >60)
GFR calc non Af Amer: >60 mL/min (ref >60)
Glucose, Bld: 143 mg/dL — ABNORMAL HIGH (ref 65–99)
POTASSIUM: 3.2 mmol/L — AB (ref 3.5–5.1)
SODIUM: 137 mmol/L (ref 135–145)

## 2014-08-25 LAB — CBC
HCT: 30.6 % — ABNORMAL LOW (ref 35.0–47.0)
Hemoglobin: 9.6 g/dL — ABNORMAL LOW (ref 12.0–16.0)
MCH: 26.6 pg (ref 26.0–34.0)
MCHC: 31.4 g/dL — ABNORMAL LOW (ref 32.0–36.0)
MCV: 84.7 fL (ref 80.0–100.0)
PLATELETS: 315 10*3/uL (ref 150–440)
RBC: 3.61 MIL/uL — ABNORMAL LOW (ref 3.80–5.20)
RDW: 16.9 % — ABNORMAL HIGH (ref 11.5–14.5)
WBC: 8.1 10*3/uL (ref 3.6–11.0)

## 2014-08-25 LAB — APTT: aPTT: 48 seconds — ABNORMAL HIGH (ref 24–36)

## 2014-08-25 LAB — PROTIME-INR
INR: 1.22
Prothrombin Time: 15.6 seconds — ABNORMAL HIGH (ref 11.4–15.0)

## 2014-08-25 LAB — TROPONIN I: Troponin I: <0.03 ng/mL (ref <0.031)

## 2014-08-25 MED ORDER — ONDANSETRON HCL 4 MG/2ML IJ SOLN
4.0000 mg | Freq: Once | INTRAMUSCULAR | Status: AC
  Administered 2014-08-25: 4 mg via INTRAVENOUS

## 2014-08-25 MED ORDER — ONDANSETRON HCL 4 MG/2ML IJ SOLN
INTRAMUSCULAR | Status: AC
  Administered 2014-08-25: 4 mg via INTRAVENOUS
  Filled 2014-08-25: qty 2

## 2014-08-25 MED ORDER — VANCOMYCIN HCL IN DEXTROSE 1-5 GM/200ML-% IV SOLN: 1000.0000 mg | Freq: Once | INTRAVENOUS | Status: DC

## 2014-08-25 MED ORDER — MORPHINE SULFATE 4 MG/ML IJ SOLN
INTRAMUSCULAR | Status: AC
  Administered 2014-08-25: 4 mg via INTRAVENOUS
  Filled 2014-08-25: qty 1

## 2014-08-25 MED ORDER — MORPHINE SULFATE 4 MG/ML IJ SOLN
4.0000 mg | Freq: Once | INTRAMUSCULAR | Status: AC
  Administered 2014-08-25: 4 mg via INTRAVENOUS

## 2014-08-25 MED ORDER — SODIUM CHLORIDE 0.9 % IV BOLUS (SEPSIS)
1000.0000 mL | Freq: Once | INTRAVENOUS | Status: AC
  Administered 2014-08-25: 1000 mL via INTRAVENOUS

## 2014-08-25 MED ORDER — IOHEXOL 350 MG/ML SOLN
100.0000 mL | Freq: Once | INTRAVENOUS | Status: AC | PRN
Start: 2014-08-25 — End: 2014-08-25
  Administered 2014-08-25: 100 mL via INTRAVENOUS

## 2014-08-25 MED ORDER — LEVOFLOXACIN IN D5W 750 MG/150ML IV SOLN
750.0000 mg | Freq: Once | INTRAVENOUS | Status: AC
  Administered 2014-08-25: 750 mg via INTRAVENOUS

## 2014-08-25 MED ORDER — LEVOFLOXACIN IN D5W 750 MG/150ML IV SOLN
INTRAVENOUS | Status: AC
  Administered 2014-08-25: 750 mg via INTRAVENOUS
  Filled 2014-08-25: qty 150

## 2014-08-25 NOTE — ED Notes (Signed)
Pt to CT via stretcher accomp by CT tech 

## 2014-08-25 NOTE — Telephone Encounter (Signed)
Called patient's mom and advised that she should go to ER./Heather S

## 2014-08-25 NOTE — ED Notes (Signed)
Pt uprite on stretcher in exam room with no distress noted; recently hosp with "saddle clot" to lung; started pradaxa on Saturday; st last 3 days has been coughing up blood; c/o right sided CP which has been present since last hospitalization; pt indicates tissue with small amount coffee-ground colored sputum; Dr Inocencio HomesGayle aware

## 2014-08-25 NOTE — ED Provider Notes (Signed)
[facility] Emergency Department Provider Note  ____________________________________________  Time seen: Approximately 6:53 PM  I have reviewed the triage vital signs and the nursing notes.   HISTORY  Chief Complaint Hemoptysis    HPI [name] is a 29 y.o. female  with history of hypercoagulable state with recurrent DVTs and PEs despite appropriate anticoagulation and IVC filter, on pradaxa which was started last week who presents for evaluation of cough with small volume hemoptysis. Patient reports for the past 3 days she has several times coughed up a dime-sized amount of rust-colored sputum with dark blood. No bright blood and never in any volume larger than that. This has happened several times. She denies any fevers or chills. She continues to have pleuritic right-sided chest pain. Current severity is mild. No modifying factors.   Past Medical History  Diagnosis Date  . Pulmonary embolism   . Collagen vascular disease     HX: RT blood clot in leg, PE    Patient Active Problem List   Diagnosis Date Noted  . PE (pulmonary thromboembolism) 08/16/2014    Past Surgical History  Procedure Laterality Date  . Abdominal hysterectomy    . Tubal ligaton    . Ivc filter      Current Outpatient Rx  Name  Route  Sig  Dispense  Refill  . acetaminophen (TYLENOL) 325 MG tablet   Oral   Take 2 tablets (650 mg total) by mouth every 6 (six) hours as needed for mild pain (or Fever >/= 101).         . ciprofloxacin (CIPRO) 250 MG tablet   Oral   Take 1 tablet (250 mg total) by mouth 2 (two) times daily.   9 tablet   0   . clonazePAM (KLONOPIN) 0.5 MG tablet   Oral   Take 0.5 mg by mouth 2 (two) times daily.         . dabigatran (PRADAXA) 150 MG CAPS capsule   Oral   Take 1 capsule (150 mg total) by mouth 2 (two) times daily.   60 capsule   0     First dose before d/c   . dabigatran (PRADAXA) 150 MG CAPS capsule   Oral   Take 1 capsule (150  mg total) by mouth once.   1 capsule   0   . HYDROcodone-acetaminophen (NORCO/VICODIN) 5-325 MG per tablet   Oral   Take 1 tablet by mouth every 6 (six) hours.         Marland Kitchen. HYDROcodone-acetaminophen (NORCO/VICODIN) 5-325 MG per tablet   Oral   Take 1-2 tablets by mouth every 6 (six) hours as needed for moderate pain.   20 tablet   0   . nicotine (NICODERM CQ - DOSED IN MG/24 HOURS) 14 mg/24hr patch   Transdermal   Place 1 patch (14 mg total) onto the skin daily.   28 patch   0   . omeprazole (PRILOSEC) 40 MG capsule   Oral   Take 40 mg by mouth daily.           Allergies Penicillins and Xarelto  No family history on file.  Social History History  Substance Use Topics  . Smoking status: Current Every Day Smoker -- 0.50 packs/day    Types: Cigarettes  . Smokeless tobacco: Not on file  . Alcohol Use: No    Review of Systems Constitutional: No fever/chills Eyes: No visual changes. ENT: No sore throat. Cardiovascular: Denies chest pain. Respiratory: Denies  shortness of breath. Gastrointestinal: No abdominal pain.  No nausea, no vomiting.  No diarrhea.  No constipation. Genitourinary: Negative for dysuria. Musculoskeletal: Negative for back pain. Skin: Negative for rash. Neurological: Negative for headaches, focal weakness or numbness.  10-point ROS otherwise negative.  ____________________________________________   PHYSICAL EXAM:  VITAL SIGNS: ED Triage Vitals  Enc Vitals Group     BP 08/25/14 1733 132/65 mmHg     Pulse Rate 08/25/14 1733 96     Resp 08/25/14 1733 18     Temp 08/25/14 1733 98.7 F (37.1 C)     Temp Source 08/25/14 1733 Oral     SpO2 08/25/14 1733 100 %     Weight 08/25/14 1733 240 lb (108.863 kg)     Height 08/25/14 1733  (1.651 m)     Head Cir --      Peak Flow --      Pain Score 08/25/14 1734 6     Pain Loc --      Pain Edu? --      Excl. in GC? --     Constitutional: Alert and oriented. Well appearing and in no acute  distress. Eyes: Conjunctivae are normal. PERRL. EOMI. Head: Atraumatic. Nose: No congestion/rhinnorhea. Mouth/Throat: Mucous membranes are moist.  Oropharynx non-erythematous. Neck: No stridor.  Cardiovascular: Normal rate, regular rhythm. Grossly normal heart sounds.  Good peripheral circulation. Respiratory: Normal respiratory effort.  No retractions. Lungs CTAB. Gastrointestinal: Soft and nontender. No distention. No abdominal bruits. No CVA tenderness. Genitourinary: deferred Musculoskeletal: No lower extremity tenderness nor edema.  No joint effusions. Neurologic:  Normal speech and language. No gross focal neurologic deficits are appreciated. Speech is normal. No gait instability. Skin:  Skin is warm, dry and intact. No rash noted. Psychiatric: Mood and affect are normal. Speech and behavior are normal.  ____________________________________________   LABS (all labs ordered are listed, but only abnormal results are displayed)  Labs Reviewed  CBC - Abnormal; Notable for the following:    RBC 3.61 (*)    Hemoglobin 9.6 (*)    HCT 30.6 (*)    MCHC 31.4 (*)    RDW 16.9 (*)    All other components within normal limits  BASIC METABOLIC PANEL - Abnormal; Notable for the following:    Potassium 3.2 (*)    Glucose, Bld 143 (*)    Calcium 8.2 (*)    All other components within normal limits  APTT - Abnormal; Notable for the following:    aPTT 48 (*)    All other components within normal limits  PROTIME-INR - Abnormal; Notable for the following:    Prothrombin Time 15.6 (*)    All other components within normal limits  CULTURE, BLOOD (ROUTINE X 2)  CULTURE, BLOOD (ROUTINE X 2)  TROPONIN I   ____________________________________________  EKG  ED ECG REPORT I, Gayla Doss, the attending physician, personally viewed and interpreted this ECG.   Date: 08/25/2014  EKG Time: 17:41  Rate: 96  Rhythm: normal sinus rhythm  Axis: normal  Intervals:none  ST&T Change: No acute  ST segment elevation  ____________________________________________  RADIOLOGY  CTA chest  IMPRESSION: 1. Again demonstrated subacute saddle pulmonary emboli with the majority of the clot burden within the right pulmonary arteries. There is mild recannulization around the right lower lobe pulmonary arteries. 2. Persistent evidence of mild right heart strain. 3. Focal and diffuse consolidation in the right lower lobe likely represents a combination of infarction and potential infection. 4. New loculated  fluid along the right lateral aspect of the right hemi thorax.  ____________________________________________   PROCEDURES  Procedure(s) performed: None  Critical Care performed: No  ____________________________________________   INITIAL IMPRESSION / ASSESSMENT AND PLAN / ED COURSE  Pertinent labs & imaging results that were available during my care of the patient were reviewed by me and considered in my medical decision making (see chart for details).  [name] is a 29 y.o. 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 On exam, she is generally well-appearing and in no acute distress. She continues to have right-sided pleuritic chest pain. She is not tachypneic, not hypoxic, not hypotensive. She is coughing up very small amounts of dark/rust-colored sputum with some dark blood in it. Her hemoglobin is stable at 9.6. We'll obtain repeat CTA chest given her history of increasing PE despite appropriate anticoagulation, her chest pain, her hemoptysis. Will discuss case with Dr. Orlie Dakin who manages her anticoagulation.  ----------------------------------------- 9:34 PM on 08/25/2014 -----------------------------------------  CTA chest discussed with the reading radiologist, Dr. Amil Amen. He reports that  this new loculated fluid collection is concerning for possible early empyema. The fluid collection does not appear to be consistent with blood. Given concern for new empyema, CTA showing right lower lobe infarction with evidence of infection, we'll give IV vancomycin, aztreonam, Levaquin. Discussed with Dr. Evette Cristal who recommends medical admission and he will consult. Discussed with hospitalist for admission. ____________________________________________   FINAL CLINICAL IMPRESSION(S) / ED DIAGNOSES  Final diagnoses:  Pleuritic chest pain  Cough with hemoptysis  Empyema lung      Gayla Doss, MD 08/25/14 2200

## 2014-08-25 NOTE — Telephone Encounter (Signed)
She said [name] has a clot in her lungs and has been coughing up blood. They want to know if you think she should go to ER or if she should come see you before Friday. Please advise.

## 2014-08-25 NOTE — ED Notes (Signed)
Pt reports she was admitted last week for a PE, started on Pradaxa. Pt reports 3 days of coughing up dark red blood. Pt has continued pain on the right side of her chest/abdomen and back since being in the hospital

## 2014-08-25 NOTE — ED Notes (Signed)
Dr. Patel in to see pt.

## 2014-08-25 NOTE — H&P (Signed)
[facility] [place] [facility]   PATIENT NAME: [name]    MR#:  [id number]  DATE OF BIRTH:  12/06/1985  DATE OF ADMISSION:  08/25/2014  PRIMARY CARE PHYSICIAN: Delton Prairieobin, Paul, MD   REQUESTING/REFERRING PHYSICIAN: Dr Inocencio HomesGayle  CHIEF COMPLAINT:   Right sided pleuritic chest pain and mild hemoptysis HISTORY OF PRESENT ILLNESS:  [name]  is a 29 y.o. female with a known history of recurrent DVT and PE. Patient underwent IVC filter and right lower extremity thrombectomy for extensive DVT in April 2016. She was discharged at that time with Lovenox shots. However she was readmitted 08/16/2014 with saddle pulmonary embolism. Patient was then started on Pradaxa and discharged on 08/21/2014 with the same. Patient today came in with increasing right-sided pleuritic chest pain and a mild hemoptysis.  In the emergency room patient was found to be hemodynamic stable. Her CT of the chest was done with contrast which did not show new PE however showed right lower lobe pulmonary infarction versus consolidation with small amount of loculated pleural effusion possible empyema. Patient denies any fever or any coughing up phlegm. She is being admitted for IV antibiotics and surgical consultation. ER physician spoke with Dr. Evette CristalSankar recommended requested internal medicine admit patient given her complexity of medical issues.  PAST MEDICAL HISTORY:   Past Medical History  Diagnosis Date  . Pulmonary embolism   . Collagen vascular disease     HX: RT blood clot in leg, PE    PAST SURGICAL HISTOIRY:   Past Surgical History  Procedure Laterality Date  . Abdominal hysterectomy    . Tubal ligaton    . Ivc filter      SOCIAL HISTORY:   History  Substance Use Topics  . Smoking status: Current Every Day Smoker -- 0.50 packs/day    Types: Cigarettes  . Smokeless tobacco: Not on file  . Alcohol Use: No    FAMILY HISTORY:  No family history on file.  DRUG ALLERGIES:    Allergies  Allergen Reactions  . Penicillins Other (See Comments)    Reaction:  Unknown  . Xarelto [Rivaroxaban] Other (See Comments)    Reaction: Pt said it "made me feel like I was drunk."    REVIEW OF SYSTEMS:  Review of Systems  Constitutional: Negative for fever, chills and diaphoresis.  HENT: Negative for congestion, ear pain, hearing loss, nosebleeds and sore throat.   Eyes: Negative for blurred vision, double vision, photophobia and pain.  Respiratory: Positive for cough, hemoptysis and shortness of breath. Negative for sputum production, wheezing and stridor.   Cardiovascular: Positive for chest pain. Negative for orthopnea, claudication and leg swelling.  Gastrointestinal: Negative for heartburn and abdominal pain.  Genitourinary: Negative for dysuria and frequency.  Musculoskeletal: Negative for back pain, joint pain and neck pain.  Skin: Negative for rash.  Neurological: Positive for weakness. Negative for tingling, sensory change, speech change, focal weakness, seizures and headaches.  Endo/Heme/Allergies: Does not bruise/bleed easily.  Psychiatric/Behavioral: Negative for memory loss. The patient is not nervous/anxious.   All other systems reviewed and are negative.    MEDICATIONS AT HOME:   Prior to Admission medications   Medication Sig Start Date End Date Taking? Authorizing Provider  clonazePAM (KLONOPIN) 0.5 MG tablet Take 0.5 mg by mouth 2 (two) times daily.   Yes Historical Provider, MD  dabigatran (PRADAXA) 150 MG CAPS capsule Take 1 capsule (150 mg total) by mouth 2 (two) times daily. 08/21/14  Yes Ramonita LabAruna Gouru, MD  dabigatran (PRADAXA) 150 MG CAPS capsule Take 1 capsule (150 mg total) by mouth once. Patient taking differently: Take 150 mg by mouth 2 (two) times daily.  08/21/14  Yes Ramonita Lab, MD  HYDROcodone-acetaminophen (NORCO/VICODIN) 5-325 MG per tablet Take 1-2 tablets by mouth every 6 (six) hours as needed for moderate pain. 08/21/14  Yes Ramonita Lab, MD   omeprazole (PRILOSEC) 40 MG capsule Take 40 mg by mouth daily.   Yes Historical Provider, MD  acetaminophen (TYLENOL) 325 MG tablet Take 2 tablets (650 mg total) by mouth every 6 (six) hours as needed for mild pain (or Fever >/= 101). 08/21/14   Ramonita Lab, MD  ciprofloxacin (CIPRO) 250 MG tablet Take 1 tablet (250 mg total) by mouth 2 (two) times daily. 08/21/14 08/25/14  Ramonita Lab, MD  nicotine (NICODERM CQ - DOSED IN MG/24 HOURS) 14 mg/24hr patch Place 1 patch (14 mg total) onto the skin daily. Patient not taking: Reported on 08/25/2014 08/21/14   Ramonita Lab, MD      VITAL SIGNS:  Blood pressure 130/67, pulse 100, temperature 98.7 F (37.1 C), temperature source Oral, resp. rate 20, height  (1.651 m), weight 108.863 kg (240 lb), SpO2 97 %.  PHYSICAL EXAMINATION:  GENERAL:  29 y.o.-year-old patient lying in the bed with no acute distress.  EYES: Pupils equal, round, reactive to light and accommodation. No scleral icterus. Extraocular muscles intact.  HEENT: Head atraumatic, normocephalic. Oropharynx and nasopharynx clear.  NECK:  Supple, no jugular venous distention. No thyroid enlargement, no tenderness.  LUNGS: decreased breath sounds bilaterally, no wheezing, rales,rhonchi or crepitation. No use of accessory muscles of respiration.  CARDIOVASCULAR: S1, S2 normal. No murmurs, rubs, or gallops.  ABDOMEN: Soft, nontender, nondistended. Bowel sounds present. No organomegaly or mass.  EXTREMITIES: No pedal edema, cyanosis, or clubbing.  NEUROLOGIC: Cranial nerves II through XII are intact. Muscle strength 5/5 in all extremities. Sensation intact. Gait not checked.  PSYCHIATRIC: The patient is alert and oriented x 3.  SKIN: No obvious rash, lesion, or ulcer.   LABORATORY PANEL:   CBC  Recent Labs Lab 08/25/14 1745  WBC 8.1  HGB 9.6*  HCT 30.6*  PLT 315    ------------------------------------------------------------------------------------------------------------------  Chemistries   Recent Labs Lab 08/25/14 1745  NA 137  K 3.2*  CL 105  CO2 25  GLUCOSE 143*  BUN 10  CREATININE 0.70  CALCIUM 8.2*   ------------------------------------------------------------------------------------------------------------------  Cardiac Enzymes  Recent Labs Lab 08/25/14 1745  TROPONINI <0.03   ------------------------------------------------------------------------------------------------------------------  RADIOLOGY:  Ct Angio Chest Pe W/cm &/or Wo Cm  08/25/2014   CLINICAL DATA:  Pulmonary embolism diagnosed 08/16/2014. Persistent right-sided chest pain and coughing of dark blood.  EXAM: CT ANGIOGRAPHY CHEST WITH CONTRAST  TECHNIQUE: Multidetector CT imaging of the chest was performed using the standard protocol during bolus administration of intravenous contrast. Multiplanar CT image reconstructions and MIPs were obtained to evaluate the vascular anatomy.  CONTRAST:  OMNIPAQUE IOHEXOL 350 MG/ML SOLN  COMPARISON:  CTA 08/16/2014  FINDINGS: Mediastinum/Nodes: Again demonstrated is saddle embolism with the larger clot burden within the proximal right main pulmonary artery. There some mild re- cannulation along the right lower lobe emboli. No new emboli identified. Again demonstrated and RV to LV ratio equal 1 consistent with mild heart strain.  Lungs/Pleura: There is a new peripheral nodular focus consolidation in the right lower lobe measuring 2.8 cm image 73, series 6. This is suggestive of a pulmonary infarction. More inferiorly there is a more diffuse consolidated  pattern which could represent infarction or infection (image 90, series 6). There is loculated pleural fluid within the lateral margin of the right hemi thorax. Left lung is clear.  Upper abdomen: Limited view of the liver, kidneys, pancreas are unremarkable. Normal adrenal glands.   Musculoskeletal: No aggressive osseous lesion.  Review of the MIP images confirms the above findings.  IMPRESSION: 1. Again demonstrated subacute saddle pulmonary emboli with the majority of the clot burden within the right pulmonary arteries. There is mild recannulization around the right lower lobe pulmonary arteries. 2. Persistent evidence of mild right heart strain. 3. Focal and diffuse consolidation in the right lower lobe likely represents a combination of infarction and potential infection. 4. New loculated fluid along the right lateral aspect of the right hemi thorax.   Electronically Signed   By: Genevive Bi M.D.   On: 08/25/2014 20:34    EKG:  NSR  IMPRESSION AND PLAN:   29 year old obese female with known history of recurrent DVT and PE comes to the hospital secondary to right pleuritic chest pain and mild hemoptysis she is noted to have  1. right-sided pleuritic chest pain with CT finding suggestive of right lower lobe pneumonia versus pulmonary infarction with small pleural LOCULATED effusion possible empyema. Admit patient to medical floor IV Levaquin and vancomycin. Follow-up blood cultures sputum culture. Surgical consultation with Dr. Lafe Garin AR. When necessary pain medicine Oxygen as needed.   2. Recurrent DVT and PE Patient has long history of hypercoagulable state with extensive workup in the past negative for any hypercoagulable condition. She is currently on  Pradaxa which I will continue at this time. We'll hold off on Pradaxa and started on heparin bridge if surgery decides he does drink as a way (the need of chest tube placement for loculated effusion.  3. Tobacco abuse patient advised on cessation counseling done about 3-4 minutes spent. Patient does not appear to be much motivated.   All the records are reviewed and case discussed with ED provider. Management plans discussed with the patient, family and they are in agreement.  CODE STATUS: Full  TOTAL TIME  TAKING CARE OF THIS PATIENT:  50 minutes    Jamorian Dimaria M.D on 08/25/2014 at 11:55 PM  Between 7am to 6pm - Pager - [phone]  After 6pm go to [email or web address] - password [email or web address] [facility]  [place]  Office  [phone]  CC: Primary care physician; Delton Prairie, MD

## 2014-08-26 ENCOUNTER — Other Ambulatory Visit: Payer: Self-pay

## 2014-08-26 ENCOUNTER — Ambulatory Visit: Payer: Self-pay | Admitting: Oncology

## 2014-08-26 DIAGNOSIS — R042 Hemoptysis: Secondary | ICD-10-CM | POA: Diagnosis present

## 2014-08-26 DIAGNOSIS — Z9851 Tubal ligation status: Secondary | ICD-10-CM | POA: Diagnosis not present

## 2014-08-26 DIAGNOSIS — I2692 Saddle embolus of pulmonary artery without acute cor pulmonale: Secondary | ICD-10-CM | POA: Diagnosis present

## 2014-08-26 DIAGNOSIS — J869 Pyothorax without fistula: Secondary | ICD-10-CM | POA: Diagnosis present

## 2014-08-26 DIAGNOSIS — Z90710 Acquired absence of both cervix and uterus: Secondary | ICD-10-CM | POA: Diagnosis not present

## 2014-08-26 DIAGNOSIS — Z888 Allergy status to other drugs, medicaments and biological substances status: Secondary | ICD-10-CM | POA: Diagnosis not present

## 2014-08-26 DIAGNOSIS — Z79899 Other long term (current) drug therapy: Secondary | ICD-10-CM | POA: Diagnosis not present

## 2014-08-26 DIAGNOSIS — F17210 Nicotine dependence, cigarettes, uncomplicated: Secondary | ICD-10-CM | POA: Diagnosis present

## 2014-08-26 DIAGNOSIS — R0781 Pleurodynia: Secondary | ICD-10-CM | POA: Diagnosis present

## 2014-08-26 DIAGNOSIS — Z86711 Personal history of pulmonary embolism: Secondary | ICD-10-CM | POA: Diagnosis not present

## 2014-08-26 DIAGNOSIS — Z86718 Personal history of other venous thrombosis and embolism: Secondary | ICD-10-CM | POA: Diagnosis not present

## 2014-08-26 DIAGNOSIS — Z880 Allergy status to penicillin: Secondary | ICD-10-CM | POA: Diagnosis not present

## 2014-08-26 DIAGNOSIS — D649 Anemia, unspecified: Secondary | ICD-10-CM | POA: Diagnosis present

## 2014-08-26 DIAGNOSIS — E669 Obesity, unspecified: Secondary | ICD-10-CM | POA: Diagnosis present

## 2014-08-26 DIAGNOSIS — E876 Hypokalemia: Secondary | ICD-10-CM | POA: Diagnosis present

## 2014-08-26 MED ORDER — VANCOMYCIN HCL IN DEXTROSE 1-5 GM/200ML-% IV SOLN
1000.0000 mg | Freq: Three times a day (TID) | INTRAVENOUS | Status: DC
  Administered 2014-08-26 – 2014-08-27 (×3): 1000 mg via INTRAVENOUS
  Filled 2014-08-26 (×8): qty 200

## 2014-08-26 MED ORDER — LEVOFLOXACIN 750 MG PO TABS
750.0000 mg | ORAL_TABLET | Freq: Every day | ORAL | Status: DC
  Administered 2014-08-26 – 2014-08-27 (×2): 750 mg via ORAL
  Filled 2014-08-26 (×2): qty 1

## 2014-08-26 MED ORDER — SODIUM CHLORIDE 0.9 % IJ SOLN
3.0000 mL | INTRAMUSCULAR | Status: DC | PRN
  Administered 2014-08-26: 3 mL via INTRAVENOUS
  Filled 2014-08-26: qty 10

## 2014-08-26 MED ORDER — LEVOFLOXACIN 500 MG PO TABS: 500.0000 mg | ORAL_TABLET | Freq: Every day | ORAL | Status: DC

## 2014-08-26 MED ORDER — POLYETHYLENE GLYCOL 3350 17 G PO PACK: 17.0000 g | PACK | Freq: Every day | ORAL | Status: DC | PRN

## 2014-08-26 MED ORDER — ACETAMINOPHEN 325 MG PO TABS: 650.0000 mg | ORAL_TABLET | Freq: Four times a day (QID) | ORAL | Status: DC | PRN

## 2014-08-26 MED ORDER — SODIUM CHLORIDE 0.9 % IV BOLUS (SEPSIS)
1000.0000 mL | Freq: Once | INTRAVENOUS | Status: AC
  Administered 2014-08-26: 1000 mL via INTRAVENOUS

## 2014-08-26 MED ORDER — SODIUM CHLORIDE 0.9 % IJ SOLN
3.0000 mL | Freq: Two times a day (BID) | INTRAMUSCULAR | Status: DC
  Administered 2014-08-26 (×2): 3 mL via INTRAVENOUS

## 2014-08-26 MED ORDER — VANCOMYCIN HCL IN DEXTROSE 1-5 GM/200ML-% IV SOLN
1000.0000 mg | INTRAVENOUS | Status: AC
  Administered 2014-08-26: 1000 mg via INTRAVENOUS
  Filled 2014-08-26: qty 200

## 2014-08-26 MED ORDER — DOCUSATE SODIUM 100 MG PO CAPS
100.0000 mg | ORAL_CAPSULE | Freq: Two times a day (BID) | ORAL | Status: DC
  Administered 2014-08-26 – 2014-08-27 (×4): 100 mg via ORAL
  Filled 2014-08-26 (×4): qty 1

## 2014-08-26 MED ORDER — PANTOPRAZOLE SODIUM 40 MG PO TBEC
40.0000 mg | DELAYED_RELEASE_TABLET | Freq: Every day | ORAL | Status: DC
  Administered 2014-08-26 – 2014-08-27 (×2): 40 mg via ORAL
  Filled 2014-08-26 (×2): qty 1

## 2014-08-26 MED ORDER — DABIGATRAN ETEXILATE MESYLATE 150 MG PO CAPS
150.0000 mg | ORAL_CAPSULE | Freq: Two times a day (BID) | ORAL | Status: DC
  Administered 2014-08-26 – 2014-08-27 (×3): 150 mg via ORAL
  Filled 2014-08-26 (×5): qty 1

## 2014-08-26 MED ORDER — MORPHINE SULFATE 2 MG/ML IJ SOLN
1.0000 mg | INTRAMUSCULAR | Status: DC | PRN
  Administered 2014-08-26 – 2014-08-27 (×7): 1 mg via INTRAVENOUS
  Filled 2014-08-26 (×7): qty 1

## 2014-08-26 MED ORDER — CLONAZEPAM 0.5 MG PO TABS
0.5000 mg | ORAL_TABLET | Freq: Two times a day (BID) | ORAL | Status: DC
  Administered 2014-08-26 – 2014-08-27 (×3): 0.5 mg via ORAL
  Filled 2014-08-26 (×3): qty 1

## 2014-08-26 MED ORDER — ACETAMINOPHEN 650 MG RE SUPP
650.0000 mg | Freq: Four times a day (QID) | RECTAL | Status: DC | PRN
Start: 2014-08-26 — End: 2014-08-27

## 2014-08-26 MED ORDER — SENNA 8.6 MG PO TABS
1.0000 | ORAL_TABLET | Freq: Two times a day (BID) | ORAL | Status: DC
  Administered 2014-08-26 – 2014-08-27 (×4): 8.6 mg via ORAL
  Filled 2014-08-26 (×4): qty 1

## 2014-08-26 MED ORDER — HYDROCODONE-ACETAMINOPHEN 5-325 MG PO TABS: 1.0000 | ORAL_TABLET | ORAL | Status: DC | PRN

## 2014-08-26 MED ORDER — HYDROCODONE-ACETAMINOPHEN 5-325 MG PO TABS
1.0000 | ORAL_TABLET | Freq: Four times a day (QID) | ORAL | Status: DC | PRN
  Administered 2014-08-26: 1 via ORAL
  Filled 2014-08-26: qty 1

## 2014-08-26 NOTE — Care Management (Signed)
Patient is listed as self-pay. I confirmed that with her. She states she does still see Dr. Mariana Kaufman in [facility] and he assists her with costs/payments. She states she manages to pay for her Rx and price shops for best prices at different pharmacies. RNCM will continue to follow.

## 2014-08-26 NOTE — Progress Notes (Signed)
ANTIBIOTIC CONSULT NOTE - INITIAL  Pharmacy Consult for Vancomycin/Levaquin Indication: rule out pneumonia  Allergies  Allergen Reactions  . Penicillins Other (See Comments)    Reaction:  Unknown  . Xarelto [Rivaroxaban] Other (See Comments)    Reaction: Pt said it "made me feel like I was drunk."    Patient Measurements: Height: 5\' 5"  (165.1 cm) Weight: 240 lb (108.863 kg) IBW/kg (Calculated) : 57 Adjusted Body Weight: 77.8 kg   Vital Signs: Temp: 98.7 F (37.1 C) (06/08 1733) Temp Source: Oral (06/08 1733) BP: 130/67 mmHg (06/08 2309) Pulse Rate: 100 (06/08 2309) Intake/Output from previous day:   Intake/Output from this shift:    Labs:  Recent Labs  08/25/14 1745  WBC 8.1  HGB 9.6*  PLT 315  CREATININE 0.70   Estimated Creatinine Clearance: 126.3 mL/min (by C-G formula based on Cr of 0.7). No results for input(s): VANCOTROUGH, VANCOPEAK, VANCORANDOM, GENTTROUGH, GENTPEAK, GENTRANDOM, TOBRATROUGH, TOBRAPEAK, TOBRARND, AMIKACINPEAK, AMIKACINTROU, AMIKACIN in the last 72 hours.   Kinetics:   Ke: 0.096, Vd: 54.5   Microbiology: Recent Results (from the past 720 hour(s))  Culture, blood (routine x 2)     Status: None   Collection Time: 08/19/14 11:03 AM  Result Value Ref Range Status   Specimen Description BLOOD  Final   Special Requests NONE  Final   Culture NO GROWTH 5 DAYS  Final   Report Status 08/24/2014 FINAL  Final  Culture, blood (routine x 2)     Status: None   Collection Time: 08/19/14 11:04 AM  Result Value Ref Range Status   Specimen Description BLOOD  Final   Special Requests NONE  Final   Culture NO GROWTH 5 DAYS  Final   Report Status 08/24/2014 FINAL  Final    Medical History: Past Medical History  Diagnosis Date  . Pulmonary embolism   . Collagen vascular disease     HX: RT blood clot in leg, PE    Medications:  Scheduled:  . levofloxacin  750 mg Oral Daily   Infusions:  . vancomycin    . vancomycin     PRN:   Assessment: Patient with hemoptysis with known h/o DVT/PE but CT suggestive of RLL PNA to begin empiric abx.   Goal of Therapy:  Vancomycin trough level 15-20 mcg/ml  Plan:  1. Vancomycin 1000 mg iv q 8 h with stacked dosing and a trough with the 5th  Dose. Will need to follow closely due to risk of accumulation.   2. Levaquin 750 mg po daily.   Luisa Hart D 08/26/2014,12:24 AM

## 2014-08-26 NOTE — Discharge Summary (Signed)
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 Pulmonary embolism   . Collagen vascular disease     HX: RT blood clot in leg, PE    HOSPITAL COURSE:  Brief history and physical [name] is a 29 y.o. female with a known history of recurrent DVT and PE. The patient has a history of recurrent DVT and PE. Her last admission was on this April for new right femoral extensive DVT. The patient got IVC filter and thrombectomy at that time, and was discharged with Lovenox. Per patient, she is compliant to Lovenox. He has had the chest pain for 2 days, which is in substernal area with radiation to the back, worsened by deep bruising. She also complains of shortness of breath, but no coughing or sputum or hemoptysis. She denies any active bleeding.CT angiogram of chest showed saddle PE. Dr. Inocencio Homes, ED physician, started heparin drip. Patient was admitted to the hospital  Hospital course  -#1 acute on chronic pulmonary embolism-patient with known history of PE and DVT, on Lovenox now coming with chest pain and CT chest noted to have acute pulmonary embolism. -She is noted to have saddle embolus with right heart strain. -Continued IV heparin . She can be considered as Lovenox failure. As she has had recent DVT last month while on Lovenox too. Outpatient hematology follow-up for further workup. Smoking cessation advice.  - per  hematology,. Patient had prior upper coagulable workup done which was negative twice in the past. -Appreciate hematology consult. - She cannot take Xarelto due to its side effects. Cannot get eliquis until her patient assistance program kicks in. Discussed about pradaxa, and patient wants to try that until eliquis can be restarted. -Before starting Pradaxa she was given 5 days of systemic anticoagulation with IV heparin until saturday and then to discharge on pradaxa. -Discussed plan with Dr. Orlie Dakin. Patient will need to follow up with her hematologist Dr. Isaiah Serge at [facility]  #2 chest pain - pleurisy--likely secondary to pulmonary embolism. Adjust her pain medications. Pulmonary has been consulted Vascular has seen the patient, and did not think that she will need thrombolytic lysis.  #3 DVT-recent right leg DVT 3 weeks ago, intervention done by vascular. Patient has history of IVC filter placement. No plans to place another one at this time. Appreciate vascular consult.  #4 Fever- high fevers spiked yesterday, Blood cultures done and pending. UA is likely impressive for UTI - started cipro x 5 days - no further fevers now  #5 GERD-on protonix  #6 tobacco use disorder-on nicotine patch. This admission patient is not trying to get off the floor to go out and smoke     DISCHARGE CONDITIONS:   Satisfactory  CONSULTS OBTAINED:    Hematology Dr. Orlie Dakin Pulmonology Vascular surgery  PROCEDURES none  DRUG ALLERGIES:   Allergies  Allergen Reactions  . Penicillins Other (See Comments)    Reaction:  Unknown  . Xarelto [Rivaroxaban] Other (See Comments)  Reaction: Pt said it "made me feel like I was drunk."    DISCHARGE MEDICATIONS:   Discharge Medication List as of 08/21/2014 10:06 AM    START taking these medications   Details  acetaminophen (TYLENOL) 325 MG tablet Take 2 tablets (650 mg total) by mouth every 6 (six) hours as needed for mild pain (or Fever >/= 101)., Starting  08/21/2014, Until Discontinued, OTC    ciprofloxacin (CIPRO) 250 MG tablet Take 1 tablet (250 mg total) by mouth 2 (two) times daily., Starting 08/21/2014, Until Wed 08/25/14, Print    dabigatran (PRADAXA) 150 MG CAPS capsule Take 1 capsule (150 mg total) by mouth 2 (two) times daily., Starting 08/21/2014, Until Discontinued, Print    !! HYDROcodone-acetaminophen (NORCO/VICODIN) 5-325 MG per tablet Take 1-2 tablets by mouth every 6 (six) hours as needed for moderate pain., Starting 08/21/2014, Until Discontinued, Print    nicotine (NICODERM CQ - DOSED IN MG/24 HOURS) 14 mg/24hr patch Place 1 patch (14 mg total) onto the skin daily., Starting 08/21/2014, Until Discontinued, Normal     !! - Potential duplicate medications found. Please discuss with provider.    CONTINUE these medications which have NOT CHANGED   Details  clonazePAM (KLONOPIN) 0.5 MG tablet Take 0.5 mg by mouth 2 (two) times daily., Until Discontinued, Historical Med    omeprazole (PRILOSEC) 40 MG capsule Take 40 mg by mouth daily., Until Discontinued, Historical Med    !! HYDROcodone-acetaminophen (NORCO/VICODIN) 5-325 MG per tablet Take 1 tablet by mouth every 6 (six) hours., Until Discontinued, Historical Med     !! - Potential duplicate medications found. Please discuss with provider.    STOP taking these medications     enoxaparin (LOVENOX) 150 MG/ML injection          DISCHARGE INSTRUCTIONS:  Follow-up with primary care physician in a week, PCP to follow up on the urine culture results Follow-up with oncology  Dr. Isaiah Serge at [facility] as scheduled   DIET:  Healthy heart DISCHARGE CONDITION:  {Satisfactory  ACTIVITY:  As tolerated but no strenuous work  OXYGEN:  Home Oxygen: No.   Oxygen Delivery: room air  DISCHARGE LOCATION:  home   If you experience worsening of your admission symptoms, develop shortness of breath, life threatening emergency, suicidal or homicidal thoughts you must seek medical attention  immediately by calling 911 or calling your MD immediately  if symptoms less severe.  You Must read complete instructions/literature along with all the possible adverse reactions/side effects for all the Medicines you take and that have been prescribed to you. Take any new Medicines after you have completely understood and accpet all the possible adverse reactions/side effects.   Please note  You were cared for by a hospitalist during your hospital stay. If you have any questions about your discharge medications or the care you received while you were in the hospital after you are discharged, you can call the unit and asked to speak with the hospitalist on call if the hospitalist that took care of you is not available. Once you are discharged, your primary care physician will handle any further medical issues. Please note that NO REFILLS for any discharge medications will be authorized once you are discharged, as it is imperative that you return to your primary care physician (or establish a relationship with a primary care physician if you do not have one) for your aftercare needs so that they can reassess your need for medications and monitor your lab values.  Today  Chief Complaint  Patient presents with  . Chest Pain    pain with inspiration    Patient is reporting chest pain but states that nothing unusual, she gets this chest pain from the underlying pulmonary embolism. Feels comfortable to go home if I can prescribe few Percocets until she sees her primary care physician  ROS:  CONSTITUTIONAL: Denies fevers, chills. Denies any fatigue, weakness.  EYES: Denies blurry vision, double vision, eye pain. EARS, NOSE, THROAT: Denies tinnitus, ear pain, hearing loss. RESPIRATORY: Denies cough, wheeze, shortness of breath.  CARDIOVASCULAR: Chronic chest pain, palpitations, edema.  GASTROINTESTINAL: Denies nausea, vomiting, diarrhea, abdominal pain. Denies bright red blood per  rectum. GENITOURINARY: Denies dysuria, hematuria. ENDOCRINE: Denies nocturia or thyroid problems. HEMATOLOGIC AND LYMPHATIC: Denies easy bruising or bleeding. SKIN: Denies rash or lesion. MUSCULOSKELETAL: Denies pain in neck, back, shoulder, knees, hips or arthritic symptoms.  NEUROLOGIC: Denies paralysis, paresthesias.  PSYCHIATRIC: Denies anxiety or depressive symptoms.   VITAL SIGNS:  Blood pressure 118/71, pulse 107, temperature 97.9 F (36.6 C), temperature source Oral, resp. rate 19, height 5\' 5"  (1.651 m), weight 111.233 kg (245 lb 3.6 oz), SpO2 92 %.  I/O:  No intake or output data in the 24 hours ending 08/26/14 1735  PHYSICAL EXAMINATION:  GENERAL:  29 y.o.-year-old patient lying in the bed with no acute distress.  EYES: Pupils equal, round, reactive to light and accommodation. No scleral icterus. Extraocular muscles intact.  HEENT: Head atraumatic, normocephalic. Oropharynx and nasopharynx clear.  NECK:  Supple, no jugular venous distention. No thyroid enlargement, no tenderness.  LUNGS: Normal breath sounds bilaterally, no wheezing, rales,rhonchi or crepitation. No use of accessory muscles of respiration.  CARDIOVASCULAR: S1, S2 normal. No murmurs, rubs, or gallops.  ABDOMEN: Soft, non-tender, non-distended. Bowel sounds present. No organomegaly or mass.  EXTREMITIES: No pedal edema, cyanosis, or clubbing.  NEUROLOGIC: Cranial nerves II through XII are intact. Muscle strength 5/5 in all extremities. Sensation intact. Gait not checked.  PSYCHIATRIC: The patient is alert and oriented x 3.  SKIN: No obvious rash, lesion, or ulcer.   DATA REVIEW:   CBC  Recent Labs Lab 08/25/14 1745  WBC 8.1  HGB 9.6*  HCT 30.6*  PLT 315    Chemistries   Recent Labs Lab 08/25/14 1745  NA 137  K 3.2*  CL 105  CO2 25  GLUCOSE 143*  BUN 10  CREATININE 0.70  CALCIUM 8.2*    Cardiac Enzymes  Recent Labs Lab 08/25/14 1745  TROPONINI <0.03    Microbiology Results   Results for orders placed or performed during the hospital encounter of 08/16/14  Culture, blood (routine x 2)     Status: None   Collection Time: 08/19/14 11:03 AM  Result Value Ref Range Status   Specimen Description BLOOD  Final   Special Requests NONE  Final   Culture NO GROWTH 5 DAYS  Final   Report Status 08/24/2014 FINAL  Final  Culture, blood (routine x 2)     Status: None   Collection Time: 08/19/14 11:04 AM  Result Value Ref Range Status   Specimen Description BLOOD  Final   Special Requests NONE  Final   Culture NO GROWTH 5 DAYS  Final   Report Status 08/24/2014 FINAL  Final    RADIOLOGY:  Ct Angio Chest Pe W/cm &/or Wo Cm  08/25/2014   CLINICAL DATA:  Pulmonary embolism diagnosed 08/16/2014. Persistent right-sided chest pain and coughing of dark blood.  EXAM: CT ANGIOGRAPHY CHEST WITH  CONTRAST  TECHNIQUE: Multidetector CT imaging of the chest was performed using the standard protocol during bolus administration of intravenous contrast. Multiplanar CT image reconstructions and MIPs were obtained to evaluate the vascular anatomy.  CONTRAST:  OMNIPAQUE IOHEXOL 350 MG/ML SOLN  COMPARISON:  CTA 08/16/2014  FINDINGS: Mediastinum/Nodes: Again demonstrated is saddle embolism with the larger clot burden within the proximal right main pulmonary artery. There some mild re- cannulation along the right lower lobe emboli. No new emboli identified. Again demonstrated and RV to LV ratio equal 1 consistent with mild heart strain.  Lungs/Pleura: There is a new peripheral nodular focus consolidation in the right lower lobe measuring 2.8 cm image 73, series 6. This is suggestive of a pulmonary infarction. More inferiorly there is a more diffuse consolidated pattern which could represent infarction or infection (image 90, series 6). There is loculated pleural fluid within the lateral margin of the right hemi thorax. Left lung is clear.  Upper abdomen: Limited view of the liver, kidneys, pancreas are  unremarkable. Normal adrenal glands.  Musculoskeletal: No aggressive osseous lesion.  Review of the MIP images confirms the above findings.  IMPRESSION: 1. Again demonstrated subacute saddle pulmonary emboli with the majority of the clot burden within the right pulmonary arteries. There is mild recannulization around the right lower lobe pulmonary arteries. 2. Persistent evidence of mild right heart strain. 3. Focal and diffuse consolidation in the right lower lobe likely represents a combination of infarction and potential infection. 4. New loculated fluid along the right lateral aspect of the right hemi thorax.   Electronically Signed   By: Genevive Bi M.D.   On: 08/25/2014 20:34    EKG:   Orders placed or performed during the hospital encounter of 08/16/14  . ED EKG (<46mins upon arrival to the ED)  . ED EKG (<41mins upon arrival to the ED)  . EKG 12-Lead  . EKG 12-Lead  . EKG      Management plans discussed with the patient, family and they are in agreement.  CODE STATUS:   TOTAL TIME TAKING CARE OF THIS PATIENT: 45 minutes.    @  on 08/26/2014 at 5:35 PM  Between 7am to 6pm - Pager - [phone]  After 6pm go to [email or web address] - password [email or web address] [facility]  [place] [facility]  Office  [phone]  CC: Primary care physician; Delton Prairie, MD

## 2014-08-26 NOTE — Progress Notes (Signed)
[facility] - [place] at [facility]   PATIENT NAME: [name]    MR#:  [id number]  DATE OF BIRTH:  [birth year]  SUBJECTIVE:  CHIEF COMPLAINT:   Chief Complaint  Patient presents with  . Hemoptysis   still hemoptysis. No cough or shortness breasts.  REVIEW OF SYSTEMS:  CONSTITUTIONAL: No fever, fatigue or weakness.  EYES: No blurred or double vision.  EARS, NOSE, AND THROAT: No tinnitus or ear pain.  RESPIRATORY: No cough, shortness of breath, no wheezing but has hemoptysis.  CARDIOVASCULAR: No chest pain, orthopnea, edema.  GASTROINTESTINAL: No nausea, vomiting, diarrhea or abdominal pain.  GENITOURINARY: No dysuria, hematuria.  ENDOCRINE: No polyuria, nocturia,  HEMATOLOGY: No anemia, easy bruising or bleeding SKIN: No rash or lesion. MUSCULOSKELETAL: No joint pain or arthritis.   NEUROLOGIC: No tingling, numbness, weakness.  PSYCHIATRY: No anxiety or depression.   DRUG ALLERGIES:   Allergies  Allergen Reactions  . Penicillins Other (See Comments)    Reaction:  Unknown  . Xarelto [Rivaroxaban] Other (See Comments)    Reaction: Pt said it "made me feel like I was drunk."    VITALS:  Blood pressure 131/97, pulse 87, temperature 98.3 F (36.8 C), temperature source Oral, resp. rate 18, height 5\' 5"  (1.651 m), weight 108.863 kg (240 lb), SpO2 97 %.  PHYSICAL EXAMINATION:  GENERAL:  29 y.o.-year-old patient lying in the bed with no acute distress.  EYES: Pupils equal, round, reactive to light and accommodation. No scleral icterus. Extraocular muscles intact.  HEENT: Head atraumatic, normocephalic. Oropharynx and nasopharynx clear.  NECK:  Supple, no jugular venous distention. No thyroid enlargement, no tenderness.  LUNGS: Normal breath sounds bilaterally, no wheezing, rales,rhonchi or crepitation. No use of accessory muscles of respiration.  CARDIOVASCULAR: S1, S2 normal. No murmurs, rubs, or gallops.  ABDOMEN: Soft, nontender, nondistended. Bowel  sounds present. No organomegaly or mass.  EXTREMITIES: No pedal edema, cyanosis, or clubbing.  NEUROLOGIC: Cranial nerves II through XII are intact. Muscle strength 5/5 in all extremities. Sensation intact. Gait not checked.  PSYCHIATRIC: The patient is alert and oriented x 3.  SKIN: No obvious rash, lesion, or ulcer.    LABORATORY PANEL:   CBC  Recent Labs Lab 08/25/14 1745  WBC 8.1  HGB 9.6*  HCT 30.6*  PLT 315   ------------------------------------------------------------------------------------------------------------------  Chemistries   Recent Labs Lab 08/25/14 1745  NA 137  K 3.2*  CL 105  CO2 25  GLUCOSE 143*  BUN 10  CREATININE 0.70  CALCIUM 8.2*   ------------------------------------------------------------------------------------------------------------------  Cardiac Enzymes  Recent Labs Lab 08/25/14 1745  TROPONINI <0.03   ------------------------------------------------------------------------------------------------------------------  RADIOLOGY:  Ct Angio Chest Pe W/cm &/or Wo Cm  08/25/2014   CLINICAL DATA:  Pulmonary embolism diagnosed 08/16/2014. Persistent right-sided chest pain and coughing of dark blood.  EXAM: CT ANGIOGRAPHY CHEST WITH CONTRAST  TECHNIQUE: Multidetector CT imaging of the chest was performed using the standard protocol during bolus administration of intravenous contrast. Multiplanar CT image reconstructions and MIPs were obtained to evaluate the vascular anatomy.  CONTRAST:  OMNIPAQUE IOHEXOL 350 MG/ML SOLN  COMPARISON:  CTA 08/16/2014  FINDINGS: Mediastinum/Nodes: Again demonstrated is saddle embolism with the larger clot burden within the proximal right main pulmonary artery. There some mild re- cannulation along the right lower lobe emboli. No new emboli identified. Again demonstrated and RV to LV ratio equal 1 consistent with mild heart strain.  Lungs/Pleura: There is a new peripheral nodular focus consolidation in the  right lower lobe measuring  2.8 cm image 73, series 6. This is suggestive of a pulmonary infarction. More inferiorly there is a more diffuse consolidated pattern which could represent infarction or infection (image 90, series 6). There is loculated pleural fluid within the lateral margin of the right hemi thorax. Left lung is clear.  Upper abdomen: Limited view of the liver, kidneys, pancreas are unremarkable. Normal adrenal glands.  Musculoskeletal: No aggressive osseous lesion.  Review of the MIP images confirms the above findings.  IMPRESSION: 1. Again demonstrated subacute saddle pulmonary emboli with the majority of the clot burden within the right pulmonary arteries. There is mild recannulization around the right lower lobe pulmonary arteries. 2. Persistent evidence of mild right heart strain. 3. Focal and diffuse consolidation in the right lower lobe likely represents a combination of infarction and potential infection. 4. New loculated fluid along the right lateral aspect of the right hemi thorax.   Electronically Signed   By: Genevive Bi M.D.   On: 08/25/2014 20:34    EKG:   Orders placed or performed during the hospital encounter of 08/25/14  . ED EKG (<5mins upon arrival to the ED)  . ED EKG (<2mins upon arrival to the ED)  . EKG 12-Lead  . EKG 12-Lead    ASSESSMENT AND PLAN:   1. right-sided pleuritic chest pain with CT finding suggestive of right lower lobe pneumonia versus pulmonary infarction with small pleural LOCULATED effusion possible empyema. Cont Levaquin and vancomycin, Follow-up blood cultures sputum culture. F/u Surgical consultation with Dr. Lafe Garin AR.  2. Recurrent DVT and PE Cont  Pradaxa. We'll hold  Pradaxa and started heparin bridge if need surgery.  3. Hypokalemia.Follow-up BMP. 4. Anemia. Stable    All the records are reviewed and case discussed with Care Management/Social Workerr. Management plans discussed with the patient, family and they are in  agreement.  CODE STATUS: Full code   TOTAL TIME TAKING CARE OF THIS PATIENT: 37 minutes.   POSSIBLE D/C IN 3 DAYS, DEPENDING ON CLINICAL CONDITION.   Shaune Pollack M.D on 08/26/2014 at 3:05 PM  Between 7am to 6pm - Pager - [phone]  After 6pm go to [email or web address] - password [email or web address] [facility] [address]  [place] [facility]  Office  [phone]  CC: Primary care physician; Delton Prairie, MD

## 2014-08-27 ENCOUNTER — Inpatient Hospital Stay: Payer: Self-pay | Admitting: Oncology

## 2014-08-27 LAB — BASIC METABOLIC PANEL
ANION GAP: 6 (ref 5–15)
BUN: 9 mg/dL (ref 6–20)
CO2: 28 mmol/L (ref 22–32)
CREATININE: 0.62 mg/dL (ref 0.44–1.00)
Calcium: 8.1 mg/dL — ABNORMAL LOW (ref 8.9–10.3)
Chloride: 106 mmol/L (ref 101–111)
Glucose, Bld: 109 mg/dL — ABNORMAL HIGH (ref 65–99)
Potassium: 3.6 mmol/L (ref 3.5–5.1)
SODIUM: 140 mmol/L (ref 135–145)

## 2014-08-27 LAB — CBC
HEMATOCRIT: 28.5 % — AB (ref 35.0–47.0)
Hemoglobin: 8.9 g/dL — ABNORMAL LOW (ref 12.0–16.0)
MCH: 26.3 pg (ref 26.0–34.0)
MCHC: 31.4 g/dL — ABNORMAL LOW (ref 32.0–36.0)
MCV: 84 fL (ref 80.0–100.0)
Platelets: 266 10*3/uL (ref 150–440)
RBC: 3.39 MIL/uL — ABNORMAL LOW (ref 3.80–5.20)
RDW: 16.9 % — AB (ref 11.5–14.5)
WBC: 6.2 10*3/uL (ref 3.6–11.0)

## 2014-08-27 LAB — MAGNESIUM: MAGNESIUM: 2.1 mg/dL (ref 1.7–2.4)

## 2014-08-27 MED ORDER — LEVOFLOXACIN 750 MG PO TABS: 750.0000 mg | ORAL_TABLET | Freq: Every day | ORAL | Status: DC

## 2014-08-27 NOTE — Discharge Summary (Signed)
[facility] [place] at [facility]   PATIENT NAME: [name]    MR#:  [id number]  DATE OF BIRTH:  09-16-1985  DATE OF ADMISSION:  08/25/2014 ADMITTING PHYSICIAN: Enedina Finner, MD  DATE OF DISCHARGE: No discharge date for patient encounter.  PRIMARY CARE PHYSICIAN: Delton Prairie, MD    ADMISSION DIAGNOSIS:  Pleuritic chest pain [R07.81] Empyema lung [J86.9] Cough with hemoptysis [R04.2]   DISCHARGE DIAGNOSIS:  right-sided pleuritic chest pain possible due to pulmonary infarction with small pleural  Recurrent DVT and PE  SECONDARY DIAGNOSIS:   Past Medical History  Diagnosis Date  . Pulmonary embolism   . Collagen vascular disease     HX: RT blood clot in leg, PE    HOSPITAL COURSE:  [name] is a 29 y.o. female with a known history of recurrent DVT and PE. Patient underwent IVC filter and right lower extremity thrombectomy for extensive DVT in April 2016. She was discharged at that time with Lovenox shots. However she was readmitted 08/16/2014 with saddle pulmonary embolism. Patient was then started on Pradaxa and discharged on 08/21/2014 with the same. Patient came to ED with increasing right-sided pleuritic chest pain and a mild hemoptysis.  1.  right-sided pleuritic chest pain with CT finding suggestive of right lower lobe pneumonia versus pulmonary infarction with small pleural LOCULATED effusion possible empyema. The patient has been treated with Levaquin and vancomycin. The patient had small hemoptysis and right-sided chest pain but is better, no fever or chills.  Dr. Lafe Garin AR. Suggested that the CT findings are minimal and do not warrant any intervention. I will do one week and Levaquin after discharge.  2. Recurrent DVT and PE Has been treated with Pradaxa, no active bleeding.  3. Hypokalemia.improved after supplements treatment.  4. Anemia. Stable  DISCHARGE CONDITIONS:   Stable  CONSULTS OBTAINED:     DRUG ALLERGIES:   Allergies   Allergen Reactions  . Penicillins Other (See Comments)    Reaction:  Unknown  . Xarelto [Rivaroxaban] Other (See Comments)    Reaction: Pt said it "made me feel like I was drunk."    DISCHARGE MEDICATIONS:   Current Discharge Medication List    START taking these medications   Details  levofloxacin (LEVAQUIN) 750 MG tablet Take 1 tablet (750 mg total) by mouth daily. Qty: 7 tablet, Refills: 0      CONTINUE these medications which have NOT CHANGED   Details  clonazePAM (KLONOPIN) 0.5 MG tablet Take 0.5 mg by mouth 2 (two) times daily.    dabigatran (PRADAXA) 150 MG CAPS capsule Take 1 capsule (150 mg total) by mouth 2 (two) times daily. Qty: 60 capsule, Refills: 0    HYDROcodone-acetaminophen (NORCO/VICODIN) 5-325 MG per tablet Take 1-2 tablets by mouth every 6 (six) hours as needed for moderate pain. Qty: 20 tablet, Refills: 0    omeprazole (PRILOSEC) 40 MG capsule Take 40 mg by mouth daily.    acetaminophen (TYLENOL) 325 MG tablet Take 2 tablets (650 mg total) by mouth every 6 (six) hours as needed for mild pain (or Fever >/= 101).    nicotine (NICODERM CQ - DOSED IN MG/24 HOURS) 14 mg/24hr patch Place 1 patch (14 mg total) onto the skin daily. Qty: 28 patch, Refills: 0      STOP taking these medications     ciprofloxacin (CIPRO) 250 MG tablet          DISCHARGE INSTRUCTIONS:      If you experience worsening of  your admission symptoms, develop shortness of breath, life threatening emergency, suicidal or homicidal thoughts you must seek medical attention immediately by calling 911 or calling your MD immediately  if symptoms less severe.  You Must read complete instructions/literature along with all the possible adverse reactions/side effects for all the Medicines you take and that have been prescribed to you. Take any new Medicines after you have completely understood and accept all the possible adverse reactions/side effects.   Please note  You were cared for  by a hospitalist during your hospital stay. If you have any questions about your discharge medications or the care you received while you were in the hospital after you are discharged, you can call the unit and asked to speak with the hospitalist on call if the hospitalist that took care of you is not available. Once you are discharged, your primary care physician will handle any further medical issues. Please note that NO REFILLS for any discharge medications will be authorized once you are discharged, as it is imperative that you return to your primary care physician (or establish a relationship with a primary care physician if you do not have one) for your aftercare needs so that they can reassess your need for medications and monitor your lab values.    Today   SUBJECTIVE   No complaint   VITAL SIGNS:  Blood pressure 109/60, pulse 88, temperature 98.3 F (36.8 C), temperature source Oral, resp. rate 22, height  (1.651 m), weight 108.863 kg (240 lb), SpO2 99 %.  I/O:   Intake/Output Summary (Last 24 hours) at 08/27/14 1314 Last data filed at 08/27/14 0907  Gross per 24 hour  Intake   1154 ml  Output    225 ml  Net    929 ml    PHYSICAL EXAMINATION:  GENERAL:  29 y.o.-year-old patient lying in the bed with no acute distress. Obese EYES: Pupils equal, round, reactive to light and accommodation. No scleral icterus. Extraocular muscles intact.  HEENT: Head atraumatic, normocephalic. Oropharynx and nasopharynx clear.  NECK:  Supple, no jugular venous distention. No thyroid enlargement, no tenderness.  LUNGS: Normal breath sounds bilaterally, no wheezing, rales,rhonchi or crepitation. No use of accessory muscles of respiration.  CARDIOVASCULAR: S1, S2 normal. No murmurs, rubs, or gallops.  ABDOMEN: Soft, non-tender, non-distended. Bowel sounds present. No organomegaly or mass.  EXTREMITIES: No pedal edema, cyanosis, or clubbing.  NEUROLOGIC: Cranial nerves II through XII are  intact. Muscle strength 5/5 in all extremities. Sensation intact. Gait not checked.  PSYCHIATRIC: The patient is alert and oriented x 3.  SKIN: No obvious rash, lesion, or ulcer.   DATA REVIEW:   CBC  Recent Labs Lab 08/27/14 0645  WBC 6.2  HGB 8.9*  HCT 28.5*  PLT 266    Chemistries   Recent Labs Lab 08/27/14 0645  NA 140  K 3.6  CL 106  CO2 28  GLUCOSE 109*  BUN 9  CREATININE 0.62  CALCIUM 8.1*  MG 2.1    Cardiac Enzymes  Recent Labs Lab 08/25/14 1745  TROPONINI <0.03    Microbiology Results  Results for orders placed or performed during the hospital encounter of 08/25/14  Blood culture (routine x 2)     Status: None (Preliminary result)   Collection Time: 08/25/14  9:45 PM  Result Value Ref Range Status   Specimen Description BLOOD  Final   Special Requests Normal  Final   Culture NO GROWTH 2 DAYS  Final   Report Status  PENDING  Incomplete  Blood culture (routine x 2)     Status: None (Preliminary result)   Collection Time: 08/25/14  9:55 PM  Result Value Ref Range Status   Specimen Description BLOOD  Final   Special Requests Normal  Final   Culture NO GROWTH 2 DAYS  Final   Report Status PENDING  Incomplete    RADIOLOGY:  Ct Angio Chest Pe W/cm &/or Wo Cm  08/25/2014   CLINICAL DATA:  Pulmonary embolism diagnosed 08/16/2014. Persistent right-sided chest pain and coughing of dark blood.  EXAM: CT ANGIOGRAPHY CHEST WITH CONTRAST  TECHNIQUE: Multidetector CT imaging of the chest was performed using the standard protocol during bolus administration of intravenous contrast. Multiplanar CT image reconstructions and MIPs were obtained to evaluate the vascular anatomy.  CONTRAST:  OMNIPAQUE IOHEXOL 350 MG/ML SOLN  COMPARISON:  CTA 08/16/2014  FINDINGS: Mediastinum/Nodes: Again demonstrated is saddle embolism with the larger clot burden within the proximal right main pulmonary artery. There some mild re- cannulation along the right lower lobe emboli. No  new emboli identified. Again demonstrated and RV to LV ratio equal 1 consistent with mild heart strain.  Lungs/Pleura: There is a new peripheral nodular focus consolidation in the right lower lobe measuring 2.8 cm image 73, series 6. This is suggestive of a pulmonary infarction. More inferiorly there is a more diffuse consolidated pattern which could represent infarction or infection (image 90, series 6). There is loculated pleural fluid within the lateral margin of the right hemi thorax. Left lung is clear.  Upper abdomen: Limited view of the liver, kidneys, pancreas are unremarkable. Normal adrenal glands.  Musculoskeletal: No aggressive osseous lesion.  Review of the MIP images confirms the above findings.  IMPRESSION: 1. Again demonstrated subacute saddle pulmonary emboli with the majority of the clot burden within the right pulmonary arteries. There is mild recannulization around the right lower lobe pulmonary arteries. 2. Persistent evidence of mild right heart strain. 3. Focal and diffuse consolidation in the right lower lobe likely represents a combination of infarction and potential infection. 4. New loculated fluid along the right lateral aspect of the right hemi thorax.   Electronically Signed   By: Genevive Bi M.D.   On: 08/25/2014 20:34    Discharge plans discussed with the patient, family and they are in agreement.  CODE STATUS:     Code Status Orders        Start     Ordered   08/26/14 0133  Full code   Continuous     08/26/14 0132      TOTAL TIME TAKING CARE OF THIS PATIENT: 36 minutes.    Shaune Pollack M.D on 08/27/2014 at 1:14 PM  Between 7am to 6pm - Pager - [phone]  After 6pm go to [email or web address] - password [email or web address] [facility]  [facility]  Office  [phone]  CC: Primary care physician; Delton Prairie, MD

## 2014-08-27 NOTE — Discharge Instructions (Signed)
Low sodium and low fat diet. Activity as tolerated. Watch for bleeding.  Notify MD for any worsening shortness of breath, difficulty breathing or chest pains.  Take all medications as prescribed.

## 2014-08-27 NOTE — Consult Note (Signed)
  Surgical consultation note  Reason for consultation: CT findings of the small amount of fluid  tending to loculate in the right pleural cavity.  This is a 29 year old female with a history of hypercoagulability who was recently has had acute pulmonary embolus with a significant clot and associated infarct in the right lung. The patient has had a history of DVTs in the past and has been followed by hematologist. Patient was admitted about 2 weeks ago at which time she was was diagnosed with a pulmonary embolus as a saddle embolus. She underwent placement of an IVC filter has been placed on Pradaxa. 3 days after she was discharged she presented to the emergency room with a complaint of some blood  with the sputum. CT scan was reviewed-the infarct is noted and it appears to be so small amount of fluid is tending to loculated right lower chest. In questioning the patient she states that she has had some pain in the right lower chest area which occurred the time of her infarct and  has not changed. The only new finding is that of some blood when she coughs but this is reportedly getting better now. She denies any fever or chills. Evaluation of her lab data shows a hemoglobin that's relatively stable and a white count was normal. At present the CT findings are minimal and do not warrant any intervention. Also being on Pradaxa will preclude any surgical intervention if required.  Recommend that she have a follow-up CT next week. All of this discussed fully with the patient.

## 2014-08-30 LAB — CULTURE, BLOOD (ROUTINE X 2)
Culture: NO GROWTH
Special Requests: NORMAL

## 2014-08-31 LAB — CULTURE, BLOOD (ROUTINE X 2): SPECIAL REQUESTS: NORMAL

## 2014-09-02 ENCOUNTER — Emergency Department: Payer: Medicaid Other

## 2014-09-02 ENCOUNTER — Emergency Department
Admission: EM | Admit: 2014-09-02 | Discharge: 2014-09-02 | Disposition: A | Discharge status: Home / Self Care | Payer: Medicaid Other | Attending: Emergency Medicine | Admitting: Emergency Medicine

## 2014-09-02 ENCOUNTER — Encounter: Payer: Self-pay | Admitting: Emergency Medicine

## 2014-09-02 DIAGNOSIS — Z880 Allergy status to penicillin: Secondary | ICD-10-CM | POA: Insufficient documentation

## 2014-09-02 DIAGNOSIS — I2782 Chronic pulmonary embolism: Secondary | ICD-10-CM

## 2014-09-02 DIAGNOSIS — R0781 Pleurodynia: Secondary | ICD-10-CM

## 2014-09-02 DIAGNOSIS — R509 Fever, unspecified: Secondary | ICD-10-CM | POA: Diagnosis present

## 2014-09-02 DIAGNOSIS — Z79899 Other long term (current) drug therapy: Secondary | ICD-10-CM | POA: Insufficient documentation

## 2014-09-02 DIAGNOSIS — Z720 Tobacco use: Secondary | ICD-10-CM | POA: Insufficient documentation

## 2014-09-02 DIAGNOSIS — R52 Pain, unspecified: Secondary | ICD-10-CM

## 2014-09-02 LAB — CBC WITH DIFFERENTIAL/PLATELET
Basophils Absolute: 0 10*3/uL (ref 0–0.1)
Basophils Relative: 1 %
EOS PCT: 1 %
Eosinophils Absolute: 0 10*3/uL (ref 0–0.7)
HCT: 33.7 % — ABNORMAL LOW (ref 35.0–47.0)
Hemoglobin: 10.9 g/dL — ABNORMAL LOW (ref 12.0–16.0)
LYMPHS PCT: 32 %
Lymphs Abs: 1.4 10*3/uL (ref 1.0–3.6)
MCH: 26.4 pg (ref 26.0–34.0)
MCHC: 32.3 g/dL (ref 32.0–36.0)
MCV: 81.7 fL (ref 80.0–100.0)
Monocytes Absolute: 0.4 10*3/uL (ref 0.2–0.9)
Monocytes Relative: 8 %
Neutro Abs: 2.5 10*3/uL (ref 1.4–6.5)
Neutrophils Relative %: 58 %
Platelets: 180 10*3/uL (ref 150–440)
RBC: 4.12 MIL/uL (ref 3.80–5.20)
RDW: 17.1 % — AB (ref 11.5–14.5)
WBC: 4.3 10*3/uL (ref 3.6–11.0)

## 2014-09-02 LAB — COMPREHENSIVE METABOLIC PANEL
ALBUMIN: 3.1 g/dL — AB (ref 3.5–5.0)
ALT: 43 U/L (ref 14–54)
ANION GAP: 6 (ref 5–15)
AST: 53 U/L — AB (ref 15–41)
Alkaline Phosphatase: 55 U/L (ref 38–126)
BILIRUBIN TOTAL: 0.3 mg/dL (ref 0.3–1.2)
BUN: 10 mg/dL (ref 6–20)
CHLORIDE: 102 mmol/L (ref 101–111)
CO2: 28 mmol/L (ref 22–32)
CREATININE: 0.7 mg/dL (ref 0.44–1.00)
Calcium: 8.6 mg/dL — ABNORMAL LOW (ref 8.9–10.3)
GFR calc Af Amer: >60 mL/min (ref >60)
GFR calc non Af Amer: >60 mL/min (ref >60)
Glucose, Bld: 105 mg/dL — ABNORMAL HIGH (ref 65–99)
Potassium: 3.9 mmol/L (ref 3.5–5.1)
Sodium: 136 mmol/L (ref 135–145)
TOTAL PROTEIN: 7.1 g/dL (ref 6.5–8.1)

## 2014-09-02 MED ORDER — HYDROCODONE-ACETAMINOPHEN 5-325 MG PO TABS
ORAL_TABLET | ORAL | Status: AC
  Administered 2014-09-02: 1 via ORAL
  Filled 2014-09-02: qty 1

## 2014-09-02 MED ORDER — APIXABAN 5 MG PO TABS
5.0000 mg | ORAL_TABLET | ORAL | Status: AC
  Administered 2014-09-02: 5 mg via ORAL
  Filled 2014-09-02: qty 1

## 2014-09-02 MED ORDER — HYDROCODONE-ACETAMINOPHEN 5-325 MG PO TABS
1.0000 | ORAL_TABLET | ORAL | Status: AC
  Administered 2014-09-02: 1 via ORAL

## 2014-09-02 NOTE — ED Provider Notes (Signed)
[facility] Emergency Department Provider Note  ____________________________________________  Time seen: Approximately 11:42 AM  I have reviewed the triage vital signs and the nursing notes.   HISTORY  Chief Complaint Fever and Cough    HPI [name] is a 29 y.o. female with a history of pulmonary embolism, right lower lobe lung pneumonia. Patient presents today with complaint of ongoing cough, and feeling like she is having hot and cold episodes at home. She is currently on Levaquin and also on Pradaxa, switching to Eliquis which is to arrive in the mail today.  Patient reports that she does not feel short of breath, but that she does still have aching pain over the right lower chest. She is concerned that she still has pneumonia and is occasionally coughing up blood.  No chest pain, except over the right lower rib cage which is been chronic. No vomiting. No abdominal pain. No headache.  Is described as aching and sharp with deep inspiration over the right lung.  Please see recent discharge from her he from 08/27/2014. I reviewed this and is pertinent for right lower lobe pneumonia which was potentially evaluated for empyema.  Past Medical History  Diagnosis Date  . Pulmonary embolism   . Collagen vascular disease     HX: RT blood clot in leg, PE    Patient Active Problem List   Diagnosis Date Noted  . Pleuritic chest pain 08/25/2014  . PE (pulmonary thromboembolism) 08/16/2014    Past Surgical History  Procedure Laterality Date  . Abdominal hysterectomy    . Tubal ligaton    . Ivc filter      Current Outpatient Rx  Name  Route  Sig  Dispense  Refill  . clonazePAM (KLONOPIN) 0.5 MG tablet   Oral   Take 0.5 mg by mouth 2 (two) times daily.         . dabigatran (PRADAXA) 150 MG CAPS capsule   Oral   Take 1 capsule (150 mg total) by mouth 2 (two) times daily.   60 capsule   0     First dose before d/c   . HYDROcodone-acetaminophen  (NORCO/VICODIN) 5-325 MG per tablet   Oral   Take 1-2 tablets by mouth every 6 (six) hours as needed for moderate pain.   20 tablet   0   . levofloxacin (LEVAQUIN) 750 MG tablet   Oral   Take 1 tablet (750 mg total) by mouth daily.   7 tablet   0   . omeprazole (PRILOSEC) 40 MG capsule   Oral   Take 40 mg by mouth daily.         . temazepam (RESTORIL) 15 MG capsule   Oral   Take 15 mg by mouth at bedtime.         Marland Kitchen acetaminophen (TYLENOL) 325 MG tablet   Oral   Take 2 tablets (650 mg total) by mouth every 6 (six) hours as needed for mild pain (or Fever >/= 101). Patient not taking: Reported on 09/02/2014         . nicotine (NICODERM CQ - DOSED IN MG/24 HOURS) 14 mg/24hr patch   Transdermal   Place 1 patch (14 mg total) onto the skin daily. Patient not taking: Reported on 08/25/2014   28 patch   0     Allergies Penicillins and Xarelto  No family history on file.  Social History History  Substance Use Topics  . Smoking status: Current Every Day Smoker --  0.50 packs/day    Types: Cigarettes  . Smokeless tobacco: Not on file  . Alcohol Use: No  Patient does still smoke  Review of Systems Constitutional: Patient feels hot and chills. Eyes: No visual changes. ENT: No sore throat. Cardiovascular: Denies chest pain. Respiratory: Denies shortness of breath. Gastrointestinal: No abdominal pain.  No nausea, no vomiting.  No diarrhea.  No constipation. Genitourinary: Negative for dysuria. Musculoskeletal: Negative for back pain. Skin: Negative for rash. Neurological: Negative for headaches, focal weakness or numbness.  10-point ROS otherwise negative.  ____________________________________________   PHYSICAL EXAM:  VITAL SIGNS: ED Triage Vitals  Enc Vitals Group     BP 09/02/14 1026 123/67 mmHg     Pulse Rate 09/02/14 1026 103     Resp 09/02/14 1026 22     Temp 09/02/14 1026 98.7 F (37.1 C)     Temp Source 09/02/14 1026 Oral     SpO2 09/02/14 1026 97  %     Weight 09/02/14 1026 240 lb (108.863 kg)     Height 09/02/14 1026  (1.651 m)     Head Cir --      Peak Flow --      Pain Score 09/02/14 1027 6     Pain Loc --      Pain Edu? --      Excl. in GC? --     Constitutional: Alert and oriented. Well appearing and in no acute distress. Eyes: Conjunctivae are normal. PERRL. EOMI. Head: Atraumatic. Nose: No congestion/rhinnorhea. Mouth/Throat: Mucous membranes are moist.  Oropharynx non-erythematous. Neck: No stridor.   Cardiovascular: Normal rate, regular rhythm. Grossly normal heart sounds.  Good peripheral circulation. Respiratory: Normal respiratory effort.  No retractions. Lungs CTAB, except for minimal decrease in lung sounds over the right lower lung. Mild pain with deep inspiration on the right. Minimal tenderness over the right lateral rib cage. No distress.  Gastrointestinal: Soft and nontender. No distention. No abdominal bruits. No CVA tenderness. Musculoskeletal: No lower extremity tenderness nor edema.  No joint effusions. Palpable dorsalis pedis pulses bilaterally. No evidence of distal cyanosis or ischemia in any extremity. Neurologic:  Normal speech and language. No gross focal neurologic deficits are appreciated. Speech is normal.  Skin:  Skin is warm, dry and intact. No rash noted. Psychiatric: Mood and affect are normal. Speech and behavior are normal.  ____________________________________________   LABS (all labs ordered are listed, but only abnormal results are displayed)  Labs Reviewed  CBC WITH DIFFERENTIAL/PLATELET - Abnormal; Notable for the following:    Hemoglobin 10.9 (*)    HCT 33.7 (*)    RDW 17.1 (*)    All other components within normal limits  COMPREHENSIVE METABOLIC PANEL - Abnormal; Notable for the following:    Glucose, Bld 105 (*)    Calcium 8.6 (*)    Albumin 3.1 (*)    AST 53 (*)    All other components within normal limits  URINE CULTURE  URINALYSIS COMPLETEWITH MICROSCOPIC ([facility]  ONLY)   ____________________________________________  EKG   ____________________________________________  RADIOLOGY  DG Chest Port 1 View (Final result) Result time: 09/02/14 11:51:24   Final result by Rad Results In Interface (09/02/14 11:51:24)   Narrative:   CLINICAL DATA: Hemoptysis. Recent diagnosis of pulmonary embolus.  EXAM: PORTABLE CHEST - 1 VIEW  COMPARISON: Chest CT August 25, 2014  FINDINGS: Most of the infiltrate and effusion on the right have cleared. A small amount of residual opacity remains in the lateral right base. Lungs  elsewhere clear. Heart size is normal. Pulmonary vascularity appears grossly normal and stable. No adenopathy.  IMPRESSION: Significant clearing from the right base with a small amount of residual opacity in the lateral right base, likely a small amount of residual loculated effusion based on recent CT appearance. No new opacity. No change in cardiac silhouette.     ____________________________________________   PROCEDURES  Procedure(s) performed: None  Critical Care performed: No  ____________________________________________   INITIAL IMPRESSION / ASSESSMENT AND PLAN / ED COURSE  Pertinent labs & imaging results that were available during my care of the patient were reviewed by me and considered in my medical decision making (see chart for details).  Patient presents with ongoing right-sided chest pain, occasional hemoptysis. Review of the patient's medical record demonstrates that this all seems to be somewhat chronic in nature. She is currently hemodynamically stable. She has ongoing Levaquin therapy and starting Eliquis today. The patient does have a notable pulmonary embolism, but this appears to be stable based on 2 CTs in the last few weeks.  Patient's chest x-ray appears improved though there is evidence of possible ongoing loculation versus empyema. I plan to discuss with Dr.  Evette Cristal  ----------------------------------------- 2:01 PM on 09/02/2014 -----------------------------------------  Patient is awake alert in no distress. I spoken with Dr. Evette Cristal reviewed her clinical course, labs, and today's x-ray. He said that he knows her, he recommends no changes. She should continue Eliquis and her antibiotic. He would recommend discharge. I discussed with the hospitalist service Dr. Imogene Burn as well, who discharged the patient and he also reviewed findings today and he recommended no changes. Patient to continue Eliquis today and follow-up with her primary care physician. The patient is quite stable, she is afebrile, white count is normal, and chest x-ray appears improving. I have given her her first dose of Eliquis today, she has a prescription that is arriving via mail today.  Patient is to follow-up with her primary care physician. Return precautions discussed with patient and her mother.  I also discussed with the patient the importance of compliance, and the need to stop smoking. I told her basically, should she continue to smoke she will likely develop another blood clot in that she is at very high risk of "death" because of her history of blood clots.  Patient is agreeable, she is going to try very hard to stop smoking. In addition she understands the importance of continuing her antibiotic's and Eliquis. She will follow-up with her primary care physician. Mother's driving home. ____________________________________________   FINAL CLINICAL IMPRESSION(S) / ED DIAGNOSES  Final diagnoses:  Chronic pulmonary embolism  Pleuritic pain      Sharyn Creamer, MD 09/02/14 [phone]

## 2014-09-02 NOTE — Discharge Instructions (Signed)
Follow-up with your doctor soon as possible.  Return to the ER right away should you develop difficulty breathing, have vomiting of large amounts of blood, cough up large amounts of blood, develope severe chest pain, trouble breathing, worsening cough, weakness, or other new concerns arise.  It is very important that you take your Eliquis as prescribed in addition to your antibiotic's.

## 2014-09-02 NOTE — ED Notes (Signed)
Pt was treated here for a blood clot around memorial day, then was seen back here for pneumonia recently, was told to come back if her fever continued. Pt states her fever has been up to 103 in the evenings this week.

## 2014-09-02 NOTE — ED Notes (Signed)
Print signature obtained upon discharge due to malfunction of e-signature pad.

## 2014-09-02 NOTE — ED Notes (Signed)
Patient is still unable to void per her report.

## 2014-09-02 NOTE — ED Notes (Signed)
Attempted IV X 2 without success. Patient advised the ultrasound machine had to be used the other day.

## 2014-09-03 ENCOUNTER — Inpatient Hospital Stay: Payer: Self-pay | Admitting: Oncology

## 2014-10-20 LAB — PROTIME-INR
INR: 0.9
Prothrombin Time: 12.8 secs

## 2014-10-20 LAB — BASIC METABOLIC PANEL
ANION GAP: 10 (ref 7–16)
BUN: 12 mg/dL
Calcium, Total: 8.9 mg/dL
Chloride: 107 mmol/L
Co2: 24 mmol/L
Creatinine: 0.69 mg/dL
EGFR (African American): >60
EGFR (Non-African Amer.): >60
Glucose: 116 mg/dL — ABNORMAL HIGH
Potassium: 3.8 mmol/L
Sodium: 141 mmol/L

## 2014-10-20 LAB — CBC
HCT: 39.9 % (ref 35.0–47.0)
HGB: 12.7 g/dL (ref 12.0–16.0)
MCH: 27.6 pg (ref 26.0–34.0)
MCHC: 31.7 g/dL — ABNORMAL LOW (ref 32.0–36.0)
MCV: 87 fL (ref 80–100)
Platelet: 252 10*3/uL (ref 150–440)
RBC: 4.58 10*6/uL (ref 3.80–5.20)
RDW: 15 % — ABNORMAL HIGH (ref 11.5–14.5)
WBC: 10.1 10*3/uL (ref 3.6–11.0)

## 2015-02-04 DIAGNOSIS — F411 Generalized anxiety disorder: Secondary | ICD-10-CM | POA: Insufficient documentation

## 2015-04-21 ENCOUNTER — Ambulatory Visit (INDEPENDENT_AMBULATORY_CARE_PROVIDER_SITE_OTHER): Payer: Medicaid Other | Admitting: Family Medicine

## 2015-04-21 ENCOUNTER — Encounter: Payer: Self-pay | Admitting: Family Medicine

## 2015-04-21 VITALS — BP 106/70 | HR 72 | Ht 65.0 in | Wt 236.8 lb

## 2015-04-21 DIAGNOSIS — E669 Obesity, unspecified: Secondary | ICD-10-CM

## 2015-04-21 DIAGNOSIS — M5136 Other intervertebral disc degeneration, lumbar region: Secondary | ICD-10-CM | POA: Insufficient documentation

## 2015-04-21 DIAGNOSIS — K219 Gastro-esophageal reflux disease without esophagitis: Secondary | ICD-10-CM | POA: Diagnosis not present

## 2015-04-21 DIAGNOSIS — Z720 Tobacco use: Secondary | ICD-10-CM

## 2015-04-21 DIAGNOSIS — Z86718 Personal history of other venous thrombosis and embolism: Secondary | ICD-10-CM

## 2015-04-21 DIAGNOSIS — Z86711 Personal history of pulmonary embolism: Secondary | ICD-10-CM | POA: Diagnosis not present

## 2015-04-21 DIAGNOSIS — F411 Generalized anxiety disorder: Secondary | ICD-10-CM

## 2015-04-21 DIAGNOSIS — M51369 Other intervertebral disc degeneration, lumbar region without mention of lumbar back pain or lower extremity pain: Secondary | ICD-10-CM | POA: Insufficient documentation

## 2015-04-21 DIAGNOSIS — I87009 Postthrombotic syndrome without complications of unspecified extremity: Secondary | ICD-10-CM | POA: Diagnosis not present

## 2015-04-21 MED ORDER — OMEPRAZOLE 40 MG PO CPDR: 40.0000 mg | DELAYED_RELEASE_CAPSULE | Freq: Every day | ORAL | Status: DC

## 2015-04-21 NOTE — Progress Notes (Signed)
Date:  04/21/2015   Name:  [name]   DOB:  09-Mar-1986   MRN:  [id number]  PCP:  Schuyler Amor, MD    Chief Complaint: New Evaluation   History of Present Illness:  This is a 30 y.o. female to establish care, previously followed at [facility] clinic but Medicaid switched here. Hx multliple DVT/PE since age 31, most recent PE in May, has been doing well on Eliquis since, followed by Dr. Isaiah Serge at [facility]. Reports out of all meds except Eliquis. Has postphlebitic syndrome RLE for which she takes Percocet on a regular basis, occ can go all day with having to take. Hx GERD on long term Prilosec, gets severe heartburn and emesis when tries to stop. On Klonopin x 5 yrs for GAD. Hx hyperglycemia but a1c normal in October. Weight about the same lately. C/o intermittent R LBP x 2 months, told DDD in past. Tetanus imm 2015, declines flu imm.   Review of Systems:  Review of Systems  Constitutional: Negative for fever.  Respiratory: Negative for shortness of breath.   Cardiovascular: Negative for chest pain.  Gastrointestinal: Negative for abdominal pain.  Endocrine: Negative for polyuria.  Genitourinary: Negative for difficulty urinating.  Neurological: Negative for syncope and light-headedness.    Patient Active Problem List   Diagnosis Date Noted  . 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 DDD (degenerative disc disease), lumbar 04/21/2015  . Obesity, Class II, BMI 35-39.9 04/21/2015  . Anxiety, generalized 02/04/2015  . Hx pulmonary embolism 08/16/2014  . Acid reflux 03/25/2014  . Current tobacco use 03/25/2014  . H/O deep venous thrombosis 12/01/2013    Prior to Admission medications   Medication Sig Start Date End Date Taking? Authorizing Provider  apixaban (ELIQUIS) 5 MG TABS tablet Take 5 mg by mouth 2 (two) times daily.   Yes Historical Provider, MD  clonazePAM (KLONOPIN) 0.5 MG tablet Take 0.5 mg by mouth 2 (two) times daily.   Yes Historical Provider, MD  omeprazole (PRILOSEC) 40 MG capsule  Take 1 capsule (40 mg total) by mouth daily. 04/21/15  Yes Schuyler Amor, MD  oxyCODONE-acetaminophen (PERCOCET/ROXICET) 5-325 MG tablet Take 1 tablet by mouth every 4 (four) hours as needed. 02/04/15  Yes Historical Provider, MD    Allergies  Allergen Reactions  . Penicillins Other (See Comments)    Reaction:  Unknown  . Xarelto [Rivaroxaban] Other (See Comments)    Reaction: Pt said it "made me feel like I was drunk."  . Ibuprofen Rash    Past Surgical History  Procedure Laterality Date  . Abdominal hysterectomy    . Tubal ligaton    . Ivc filter      Social History  Substance Use Topics  . Smoking status: Current Every Day Smoker -- 0.50 packs/day for 17 years    Types: Cigarettes  . Smokeless tobacco: None  . Alcohol Use: No    Family History  Problem Relation Age of Onset  . Diabetes Mother   . Hypertension Mother   . COPD Mother   . COPD Father   . Stroke Father     Medication list has been reviewed and updated.  Physical Examination: BP 106/70 mmHg  Pulse 72  Ht  (1.651 m)  Wt 236 lb 12.8 oz (107.412 kg)  BMI 39.41 kg/m2  Physical Exam  Constitutional: She appears well-developed and well-nourished.  Cardiovascular: Normal rate, regular rhythm and normal heart sounds.   Pulmonary/Chest: Effort normal and breath sounds normal.  Neurological: She is alert.  Skin: Skin is warm and dry.  Psychiatric: She has a normal mood and affect. Her behavior is normal.  Nursing note and vitals reviewed.   Assessment and Plan:  1. Postphlebitic syndrome On Percocet, explained unable to refill chronic narcotics here - Ambulatory referral to Pain Clinic  2. Hx pulmonary embolism Well controlled on Eliquis, followed by Dr. Isaiah Serge at [facility]  3. Gastroesophageal reflux disease, esophagitis presence not specified On chronic Prilosec, LT se's discussed, will refill given severe sxs with discontinuation  4. Anxiety, generalized On chronic Klonopin, explained unable to  refill here  5. H/O deep venous thrombosis  6. Current tobacco use Strongly advised cessation esp given recurrent PE/DVT  7. DDD (degenerative disc disease), lumbar Per pt report, may take Tylenol prn  8. Obesity, Class II, BMI 35-39.9 - TSH - Vitamin D (25 hydroxy)  Return in about 4 weeks (around 05/19/2015).  Dionne Ano. Kingsley Spittle MD [facility]  04/21/2015

## 2015-05-20 ENCOUNTER — Ambulatory Visit: Payer: Medicaid Other | Admitting: Family Medicine

## 2015-06-09 ENCOUNTER — Encounter: Payer: Self-pay | Admitting: Anesthesiology

## 2015-06-09 ENCOUNTER — Ambulatory Visit: Payer: Medicaid Other | Attending: Anesthesiology | Admitting: Anesthesiology

## 2015-06-09 VITALS — BP 102/55 | HR 96 | Temp 98.1°F | Resp 18 | Ht 65.0 in | Wt 238.0 lb

## 2015-06-09 DIAGNOSIS — R079 Chest pain, unspecified: Secondary | ICD-10-CM | POA: Insufficient documentation

## 2015-06-09 DIAGNOSIS — M7989 Other specified soft tissue disorders: Secondary | ICD-10-CM | POA: Diagnosis not present

## 2015-06-09 DIAGNOSIS — Z90710 Acquired absence of both cervix and uterus: Secondary | ICD-10-CM | POA: Diagnosis not present

## 2015-06-09 DIAGNOSIS — M545 Low back pain: Secondary | ICD-10-CM | POA: Insufficient documentation

## 2015-06-09 DIAGNOSIS — M2550 Pain in unspecified joint: Secondary | ICD-10-CM | POA: Diagnosis not present

## 2015-06-09 DIAGNOSIS — M5136 Other intervertebral disc degeneration, lumbar region: Secondary | ICD-10-CM | POA: Insufficient documentation

## 2015-06-09 DIAGNOSIS — Z86711 Personal history of pulmonary embolism: Secondary | ICD-10-CM | POA: Diagnosis not present

## 2015-06-09 DIAGNOSIS — I87009 Postthrombotic syndrome without complications of unspecified extremity: Secondary | ICD-10-CM | POA: Diagnosis not present

## 2015-06-09 DIAGNOSIS — M79606 Pain in leg, unspecified: Secondary | ICD-10-CM | POA: Insufficient documentation

## 2015-06-09 DIAGNOSIS — M25569 Pain in unspecified knee: Secondary | ICD-10-CM | POA: Diagnosis not present

## 2015-06-09 DIAGNOSIS — F17210 Nicotine dependence, cigarettes, uncomplicated: Secondary | ICD-10-CM | POA: Insufficient documentation

## 2015-06-09 NOTE — Progress Notes (Signed)
Safety precautions to be maintained throughout the outpatient stay will include: orient to surroundings, keep bed in low position, maintain call bell within reach at all times, provide assistance with transfer out of bed and ambulation.  

## 2015-06-10 NOTE — Progress Notes (Signed)
Subjective:  Patient ID: [name], female    DOB: [birth year]  Age: 30 y.o. MRN: [id number]  CC: Chest Pain and Leg Pain   HPI [name] presents for resents today for new patient evaluation. [name] has a long history greater than 9 years of right lower leg pain with collagen vascular disease and history of multiple PEs. The pain [name] describes is primarily in the right lower leg with associated swelling and pain that wakes her up at night with associated fatigue and color changes the right leg. The quality of the pain is aching exhausting uncomfortable tender and throbbing occasionally sharp maximum VAS score of 9 it does not appear to be influenced by time of day but is aggravated by bending climbing sitting standing and walking and alleviated by rest and medication management. [name] has had an extensive workup for this and presents with a diagnosis postphlebitic syndrome. [name] occasionally has some give way weakness and occasionally has some associated low back pain [name] has been on blood thinners for considerable period of time and is status post a Greenfield filter with a history of multiple PEs. [name] takes medications for pain relief including opioids that tend to keep her pain under better control. Despite multiple therapeutic modalities [name] has failed to gain any significant improvement without medication management and [name] has been referred for evaluation and assistance with pain medication management going forward.  History [name] has a past medical history of Pulmonary embolism (HCC) and Collagen vascular disease (HCC).   [name] has past surgical history that includes Abdominal hysterectomy; tubal ligaton; and IVC filter.   Her family history includes COPD in her father and mother; Diabetes in her mother; Hypertension in her mother; Stroke in her father.[name] reports that [name] has been smoking Cigarettes.  [name] has a 8.5 pack-year smoking history. [name] does not have any smokeless tobacco history on file.  [name] reports that [name] does not drink alcohol or use illicit drugs.   ---------------------------------------------------------------------------------------------------------------------- Past Medical History  Diagnosis Date  . Pulmonary embolism (HCC)   . Collagen vascular disease (HCC)     HX: RT blood clot in leg, PE    Past Surgical History  Procedure Laterality Date  . Abdominal hysterectomy    . Tubal ligaton    . Ivc filter      Family History  Problem Relation Age of Onset  . Diabetes Mother   . Hypertension Mother   . COPD Mother   . COPD Father   . Stroke Father     Social History  Substance Use Topics  . Smoking status: Current Every Day Smoker -- 0.50 packs/day for 17 years    Types: Cigarettes  . Smokeless tobacco: Not on file  . Alcohol Use: No    ---------------------------------------------------------------------------------------------------------------------- Social History   Social History  . Marital Status: Single    Spouse Name: N/A  . Number of Children: N/A  . Years of Education: N/A   Social History Main Topics  . Smoking status: Current Every Day Smoker -- 0.50 packs/day for 17 years    Types: Cigarettes  . Smokeless tobacco: None  . Alcohol Use: No  . Drug Use: No  . Sexual Activity: Not Asked   Other Topics Concern  . None   Social History Narrative      ----------------------------------------------------------------------------------------------------------------------  ROS Review of Systems  Cardiac: On blood thinners Pulmonary: Smoker Neurologic: Negative Psychologic: Anxiety GI: Reflux Hematologic: Easy bruising and history of coagulation disorder  Objective:  BP 102/55 mmHg  Pulse 96  Temp(Src) 98.1 F (36.7 C) (Oral)  Resp 18  Ht 5\' 5"  (1.651 m)  Wt 238 lb (107.956 kg)  BMI 39.61 kg/m2  SpO2 98%  Physical Exam  Patient is alert and oriented 3 Pupils are equally round and reactive to light  extraocular muscles are intact Heart is regular rate and rhythm without murmur Lungs are clear to auscultation Inspection of the right lower leg reveals some baseline edema with symmetry as compared to the left leg no erythema and sensation appears to be grossly intact muscle function is intact bilaterally rated at 5 over 5     Assessment & Plan:   [name] was seen today for chest pain and leg pain.  Diagnoses and all orders for this visit:  Postphlebitic syndrome -     ToxASSURE Select 13 (MW), Urine  Arthralgia of lower leg, unspecified laterality -     ToxASSURE Select 13 (MW), Urine  DDD (degenerative disc disease), lumbar -     ToxASSURE Select 13 (MW), Urine     ----------------------------------------------------------------------------------------------------------------------  Problem List Items Addressed This Visit      Cardiovascular and Mediastinum   Postphlebitic syndrome - Primary   Relevant Orders   ToxASSURE Select 13 (MW), Urine     Musculoskeletal and Integument   DDD (degenerative disc disease), lumbar   Relevant Orders   ToxASSURE Select 13 (MW), Urine    Other Visit Diagnoses    Arthralgia of lower leg, unspecified laterality        Relevant Orders    ToxASSURE Select 13 (MW), Urine       ----------------------------------------------------------------------------------------------------------------------  1. Postphlebitic syndrome [name] has failed conservative therapy and has required opioid medication management in the past relief. We've talked about some different options available to her. I do not think [name] is a candidate injection therapy. We have been asked to assist her primary care physicians with monitoring her opioid usage. We will follow her on a monthly basis with her next appointment in 2 weeks at which point we will send writing for her medication management for her opioids. - ToxASSURE Select 13 (MW), Urine  2. Arthralgia of lower  leg, unspecified laterality As above  - ToxASSURE Select 13 (MW), Urine  3. DDD (degenerative disc disease), lumbar Ultimately [name] may be a candidate for an epidural injection. - ToxASSURE Select 13 (MW), Urine    ----------------------------------------------------------------------------------------------------------------------  I am having [name] maintain her clonazePAM, apixaban, oxyCODONE-acetaminophen, and omeprazole.   No orders of the defined types were placed in this encounter.       Follow-up: Return in about 2 weeks (around 06/23/2015) for evaluation, med refill.    Jessica EdwardsJames G Valissa Lyvers, MD

## 2015-06-16 LAB — TOXASSURE SELECT 13 (MW), URINE: PDF: 0

## 2015-06-23 ENCOUNTER — Ambulatory Visit: Payer: Medicaid Other | Attending: Anesthesiology | Admitting: Anesthesiology

## 2015-06-23 ENCOUNTER — Encounter: Payer: Self-pay | Admitting: Anesthesiology

## 2015-06-23 VITALS — BP 116/72 | HR 86 | Temp 98.7°F | Resp 136 | Ht 65.0 in | Wt 232.0 lb

## 2015-06-23 DIAGNOSIS — F17210 Nicotine dependence, cigarettes, uncomplicated: Secondary | ICD-10-CM | POA: Insufficient documentation

## 2015-06-23 DIAGNOSIS — M5136 Other intervertebral disc degeneration, lumbar region: Secondary | ICD-10-CM | POA: Diagnosis not present

## 2015-06-23 DIAGNOSIS — M2550 Pain in unspecified joint: Secondary | ICD-10-CM | POA: Insufficient documentation

## 2015-06-23 DIAGNOSIS — M7989 Other specified soft tissue disorders: Secondary | ICD-10-CM | POA: Diagnosis not present

## 2015-06-23 DIAGNOSIS — M79606 Pain in leg, unspecified: Secondary | ICD-10-CM | POA: Diagnosis present

## 2015-06-23 DIAGNOSIS — Z86711 Personal history of pulmonary embolism: Secondary | ICD-10-CM | POA: Diagnosis not present

## 2015-06-23 DIAGNOSIS — I87009 Postthrombotic syndrome without complications of unspecified extremity: Secondary | ICD-10-CM

## 2015-06-23 DIAGNOSIS — M25569 Pain in unspecified knee: Secondary | ICD-10-CM

## 2015-06-23 MED ORDER — OXYCODONE-ACETAMINOPHEN 5-325 MG PO TABS: 1.0000 | ORAL_TABLET | ORAL | Status: DC | PRN

## 2015-06-23 NOTE — Progress Notes (Signed)
Subjective:  Patient ID: [name], female    DOB: 11/28/1985  Age: 30 y.o. MRN: [id number]  CC: Leg Pain   HPI Coley L Magro presents for presents today for  revauation. She has a long history greater than 9 years of right lower leg pain with collagen vascular disease and history of multiple PEs. The pain she describes is primarily in the right lower leg with associated swelling and pain that wakes her up at night with associated fatigue and color changes the right leg. The quality of the pain is aching exhausting uncomfortable tender and throbbing occasionally sharp maximum VAS score of 9 it does not appear to be influenced by time of day but is aggravated by bending climbing sitting standing and walking and alleviated by rest and medication management. She has had an extensive workup for this and presents with a diagnosis postphlebitic syndrome. She occasionally has some give way weakness and occasionally has some associated low back pain she has been on blood thinners for considerable period of time and is status post a Greenfield filter with a history of multiple PEs.    There are no significant changes in the quality or characteristic of her leg pain or back pain during today's visit.  History Iris has a past medical history of Pulmonary embolism (HCC) and Collagen vascular disease (HCC).   She has past surgical history that includes Abdominal hysterectomy; tubal ligaton; and IVC filter.   Her family history includes COPD in her father and mother; Diabetes in her mother; Hypertension in her mother; Stroke in her father.She reports that she has been smoking Cigarettes.  She has a 8.5 pack-year smoking history. She does not have any smokeless tobacco history on file. She reports that she does not drink alcohol or use illicit drugs.   ---------------------------------------------------------------------------------------------------------------------- Past Medical History  Diagnosis Date   . Pulmonary embolism (HCC)   . Collagen vascular disease (HCC)     HX: RT blood clot in leg, PE    Past Surgical History  Procedure Laterality Date  . Abdominal hysterectomy    . Tubal ligaton    . Ivc filter      Family History  Problem Relation Age of Onset  . Diabetes Mother   . Hypertension Mother   . COPD Mother   . COPD Father   . Stroke Father     Social History  Substance Use Topics  . Smoking status: Current Every Day Smoker -- 0.50 packs/day for 17 years    Types: Cigarettes  . Smokeless tobacco: Not on file  . Alcohol Use: No    ---------------------------------------------------------------------------------------------------------------------- Social History   Social History  . Marital Status: Single    Spouse Name: N/A  . Number of Children: N/A  . Years of Education: N/A   Social History Main Topics  . Smoking status: Current Every Day Smoker -- 0.50 packs/day for 17 years    Types: Cigarettes  . Smokeless tobacco: None  . Alcohol Use: No  . Drug Use: No  . Sexual Activity: Not Asked   Other Topics Concern  . None   Social History Narrative      ----------------------------------------------------------------------------------------------------------------------  ROS Review of Systems  Cardiac: On blood thinners Pulmonary: Smoker Neurologic: Negative Psychologic: Anxiety GI: Reflux Hematologic: Easy bruising and history of coagulation disorder   Objective:  BP 116/72 mmHg  Pulse 86  Temp(Src) 98.7 F (37.1 C) (Oral)  Resp 136  Ht 5\' 5"  (1.651 m)  Wt 232 lb (  105.235 kg)  BMI 38.61 kg/m2  SpO2 99%  Physical Exam  Patient is alert and oriented 3 Pupils are equally round and reactive to light extraocular muscles are intact Heart is regular rate and rhythm without murmur No change in lower extremity strength exam noted today   Assessment & Plan:   Juliane was seen today for leg pain.  Diagnoses and all orders for this  visit:  Postphlebitic syndrome -     ToxASSURE Select 13 (MW), Urine  Arthralgia of lower leg, unspecified laterality -     ToxASSURE Select 13 (MW), Urine  DDD (degenerative disc disease), lumbar -     ToxASSURE Select 13 (MW), Urine  Other orders -     oxyCODONE-acetaminophen (PERCOCET/ROXICET) 5-325 MG tablet; Take 1 tablet by mouth every 4 (four) hours as needed.     ----------------------------------------------------------------------------------------------------------------------  Problem List Items Addressed This Visit      Cardiovascular and Mediastinum   Postphlebitic syndrome - Primary   Relevant Orders   ToxASSURE Select 13 (MW), Urine     Musculoskeletal and Integument   DDD (degenerative disc disease), lumbar   Relevant Medications   oxyCODONE-acetaminophen (PERCOCET/ROXICET) 5-325 MG tablet   Other Relevant Orders   ToxASSURE Select 13 (MW), Urine    Other Visit Diagnoses    Arthralgia of lower leg, unspecified laterality        Relevant Orders    ToxASSURE Select 13 (MW), Urine       ----------------------------------------------------------------------------------------------------------------------  1. Postphlebitic syndrome She has failed conservative therapy and has required opioid medication management in the past relief. We've talked about some different options available to her. I do not think she is a candidate injection therapy. We have been asked to assist her primary care physicians with monitoring her opioid usage. I have also reviewed her urine tox screen today. We will start her on Percocet for pain relief today with return to clinic in 1 month - ToxASSURE Select 13 (MW), Urine  2. Arthralgia of lower leg, unspecified laterality As above  - ToxASSURE Select 13 (MW), Urine  3. DDD (degenerative disc disease), lumbar Ultimately she may be a candidate for an epidural injection. - ToxASSURE Select 13 (MW),  Urine    ----------------------------------------------------------------------------------------------------------------------  I am having Ms. Sulser maintain her clonazePAM, apixaban, omeprazole, and oxyCODONE-acetaminophen.   Meds ordered this encounter  Medications  . oxyCODONE-acetaminophen (PERCOCET/ROXICET) 5-325 MG tablet    Sig: Take 1 tablet by mouth every 4 (four) hours as needed.    Dispense:  75 tablet    Refill:  0       Follow-up: Return in about 1 month (around 07/23/2015) for evaluation, med refill.    Yevette Edwards, MD

## 2015-06-23 NOTE — Progress Notes (Signed)
Safety precautions to be maintained throughout the outpatient stay will include: orient to surroundings, keep bed in low position, maintain call bell within reach at all times, provide assistance with transfer out of bed and ambulation.  

## 2015-06-23 NOTE — Patient Instructions (Signed)
Prescription given for oxycodone

## 2015-07-02 LAB — TOXASSURE SELECT 13 (MW), URINE: PDF: 0

## 2015-07-25 ENCOUNTER — Encounter: Payer: Self-pay | Admitting: Anesthesiology

## 2015-07-25 ENCOUNTER — Ambulatory Visit: Payer: Medicaid Other | Attending: Anesthesiology | Admitting: Anesthesiology

## 2015-07-25 VITALS — BP 122/63 | HR 100 | Temp 97.7°F | Resp 16 | Ht 65.0 in | Wt 235.0 lb

## 2015-07-25 DIAGNOSIS — M25569 Pain in unspecified knee: Secondary | ICD-10-CM | POA: Diagnosis not present

## 2015-07-25 DIAGNOSIS — M5136 Other intervertebral disc degeneration, lumbar region: Secondary | ICD-10-CM | POA: Diagnosis not present

## 2015-07-25 DIAGNOSIS — Z86711 Personal history of pulmonary embolism: Secondary | ICD-10-CM | POA: Diagnosis not present

## 2015-07-25 DIAGNOSIS — F17210 Nicotine dependence, cigarettes, uncomplicated: Secondary | ICD-10-CM | POA: Insufficient documentation

## 2015-07-25 DIAGNOSIS — R079 Chest pain, unspecified: Secondary | ICD-10-CM | POA: Insufficient documentation

## 2015-07-25 DIAGNOSIS — M79604 Pain in right leg: Secondary | ICD-10-CM | POA: Insufficient documentation

## 2015-07-25 DIAGNOSIS — Z8249 Family history of ischemic heart disease and other diseases of the circulatory system: Secondary | ICD-10-CM | POA: Insufficient documentation

## 2015-07-25 DIAGNOSIS — I87009 Postthrombotic syndrome without complications of unspecified extremity: Secondary | ICD-10-CM | POA: Insufficient documentation

## 2015-07-25 DIAGNOSIS — M79606 Pain in leg, unspecified: Secondary | ICD-10-CM | POA: Diagnosis present

## 2015-07-25 MED ORDER — OXYCODONE-ACETAMINOPHEN 5-325 MG PO TABS: 1.0000 | ORAL_TABLET | ORAL | Status: DC | PRN

## 2015-07-25 NOTE — Progress Notes (Signed)
Safety precautions to be maintained throughout the outpatient stay will include: orient to surroundings, keep bed in low position, maintain call bell within reach at all times, provide assistance with transfer out of bed and ambulation.  

## 2015-07-27 NOTE — Progress Notes (Signed)
Subjective:  Patient ID: [name], female    DOB: 02/15/1986  Age: 30 y.o. MRN: [id number]  CC: Leg Pain and Chest Pain   HPI [name] presents for presents today for  revauation. She has a long history greater than 9 years of right lower leg pain with collagen vascular disease and history of multiple PEs. The pain she describes is primarily in the right lower leg with associated swelling and pain that wakes her up at night with associated fatigue and color changes the right leg. The quality of the pain is aching exhausting uncomfortable tender and throbbing occasionally sharp maximum VAS score of 9 it does not appear to be influenced by time of day but is aggravated by bending climbing sitting standing and walking and alleviated by rest and medication management. She has had an extensive workup for this and presents with a diagnosis postphlebitic syndrome. She occasionally has some give way weakness and occasionally has some associated low back pain she has been on blood thinners for considerable period of time and is status post a Greenfield filter with a history of multiple PEs.    Today she reports no significant changes in the quality characteristic or distribution of her pain. She is taking her medications as prescribed with no diverting or illicit use.  History Idara has a past medical history of Pulmonary embolism (HCC) and Collagen vascular disease (HCC).   She has past surgical history that includes Abdominal hysterectomy; tubal ligaton; and IVC filter.   Her family history includes COPD in her father and mother; Diabetes in her mother; Hypertension in her mother; Stroke in her father.She reports that she has been smoking Cigarettes.  She has a 8.5 pack-year smoking history. She does not have any smokeless tobacco history on file. She reports that she does not drink alcohol or use illicit  drugs.   ---------------------------------------------------------------------------------------------------------------------- Past Medical History  Diagnosis Date  . Pulmonary embolism (HCC)   . Collagen vascular disease (HCC)     HX: RT blood clot in leg, PE    Past Surgical History  Procedure Laterality Date  . Abdominal hysterectomy    . Tubal ligaton    . Ivc filter      Family History  Problem Relation Age of Onset  . Diabetes Mother   . Hypertension Mother   . COPD Mother   . COPD Father   . Stroke Father     Social History  Substance Use Topics  . Smoking status: Current Every Day Smoker -- 0.50 packs/day for 17 years    Types: Cigarettes  . Smokeless tobacco: Not on file  . Alcohol Use: No    ---------------------------------------------------------------------------------------------------------------------- Social History   Social History  . Marital Status: Single    Spouse Name: N/A  . Number of Children: N/A  . Years of Education: N/A   Social History Main Topics  . Smoking status: Current Every Day Smoker -- 0.50 packs/day for 17 years    Types: Cigarettes  . Smokeless tobacco: None  . Alcohol Use: No  . Drug Use: No  . Sexual Activity: Not Asked   Other Topics Concern  . 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  Diagnoses and all orders for this visit:  Postphlebitic syndrome  Arthralgia of lower leg, unspecified laterality  DDD (degenerative disc disease), lumbar  Other orders -     oxyCODONE-acetaminophen (PERCOCET/ROXICET) 5-325 MG tablet; Take 1 tablet by mouth every 4 (four) hours as needed.     ----------------------------------------------------------------------------------------------------------------------  Problem List Items Addressed This Visit      Cardiovascular and Mediastinum   Postphlebitic syndrome - Primary     Musculoskeletal and Integument   DDD (degenerative disc disease), lumbar   Relevant Medications   oxyCODONE-acetaminophen (PERCOCET/ROXICET) 5-325 MG tablet    Other Visit Diagnoses    Arthralgia of lower leg, unspecified laterality           ----------------------------------------------------------------------------------------------------------------------  1. Postphlebitic syndrome She has failed conservative therapy and has required opioid medication management in the past relief. We've talked about some different options available to her. I do not think she is a candidate injection therapy. We have been asked to assist her primary care physicians with monitoring her opioid usage. I have also reviewed her urine tox screen today. We will start her on Percocet for pain relief today with return to clinic in 1 month - ToxASSURE Select 13 (MW), Urine  2. Arthralgia of lower leg, unspecified laterality As above  - ToxASSURE Select 13 (MW), Urine  3. DDD (degenerative disc disease), lumbar Ultimately she may be a candidate for an epidural injection. - ToxASSURE Select 13 (MW),  Urine    ----------------------------------------------------------------------------------------------------------------------  I am having [name] maintain her clonazePAM, apixaban, omeprazole, and oxyCODONE-acetaminophen.   Meds ordered this encounter  Medications  . oxyCODONE-acetaminophen (PERCOCET/ROXICET) 5-325 MG tablet    Sig: Take 1 tablet by mouth every 4 (four) hours as needed.    Dispense:  75 tablet    Refill:  0       Follow-up: Return in about 1 month (around 08/25/2015) for evaluation, med refill.    Yevette Edwards, MD

## 2015-08-23 ENCOUNTER — Ambulatory Visit: Payer: Medicaid Other | Attending: Anesthesiology | Admitting: Anesthesiology

## 2015-08-23 ENCOUNTER — Encounter: Payer: Self-pay | Admitting: Anesthesiology

## 2015-08-23 VITALS — BP 106/66 | HR 105 | Temp 97.8°F | Resp 18 | Ht 65.0 in | Wt 234.0 lb

## 2015-08-23 DIAGNOSIS — K219 Gastro-esophageal reflux disease without esophagitis: Secondary | ICD-10-CM | POA: Diagnosis not present

## 2015-08-23 DIAGNOSIS — M5136 Other intervertebral disc degeneration, lumbar region: Secondary | ICD-10-CM

## 2015-08-23 DIAGNOSIS — F17210 Nicotine dependence, cigarettes, uncomplicated: Secondary | ICD-10-CM | POA: Diagnosis not present

## 2015-08-23 DIAGNOSIS — Z86711 Personal history of pulmonary embolism: Secondary | ICD-10-CM | POA: Diagnosis not present

## 2015-08-23 DIAGNOSIS — M2550 Pain in unspecified joint: Secondary | ICD-10-CM | POA: Diagnosis not present

## 2015-08-23 DIAGNOSIS — M25569 Pain in unspecified knee: Secondary | ICD-10-CM | POA: Diagnosis not present

## 2015-08-23 DIAGNOSIS — I87009 Postthrombotic syndrome without complications of unspecified extremity: Secondary | ICD-10-CM | POA: Diagnosis not present

## 2015-08-23 DIAGNOSIS — F419 Anxiety disorder, unspecified: Secondary | ICD-10-CM | POA: Insufficient documentation

## 2015-08-23 DIAGNOSIS — M79606 Pain in leg, unspecified: Secondary | ICD-10-CM | POA: Diagnosis present

## 2015-08-23 MED ORDER — OXYCODONE-ACETAMINOPHEN 5-325 MG PO TABS: 1.0000 | ORAL_TABLET | ORAL | Status: DC | PRN

## 2015-08-23 NOTE — Patient Instructions (Signed)
Smoking Cessation, Tips for Success If you are ready to quit smoking, congratulations! You have chosen to help yourself be healthier. Cigarettes bring nicotine, tar, carbon monoxide, and other irritants into your body. Your lungs, heart, and blood vessels will be able to work better without these poisons. There are many different ways to quit smoking. Nicotine gum, nicotine patches, a nicotine inhaler, or nicotine nasal spray can help with physical craving. Hypnosis, support groups, and medicines help break the habit of smoking. WHAT THINGS CAN I DO TO MAKE QUITTING EASIER?  Here are some tips to help you quit for good:  Pick a date when you will quit smoking completely. Tell all of your friends and family about your plan to quit on that date.  Do not try to slowly cut down on the number of cigarettes you are smoking. Pick a quit date and quit smoking completely starting on that day.  Throw away all cigarettes.   Clean and remove all ashtrays from your home, work, and car.  On a card, write down your reasons for quitting. Carry the card with you and read it when you get the urge to smoke.  Cleanse your body of nicotine. Drink enough water and fluids to keep your urine clear or pale yellow. Do this after quitting to flush the nicotine from your body.  Learn to predict your moods. Do not let a bad situation be your excuse to have a cigarette. Some situations in your life might tempt you into wanting a cigarette.  Never have "just one" cigarette. It leads to wanting another and another. Remind yourself of your decision to quit.  Change habits associated with smoking. If you smoked while driving or when feeling stressed, try other activities to replace smoking. Stand up when drinking your coffee. Brush your teeth after eating. Sit in a different chair when you read the paper. Avoid alcohol while trying to quit, and try to drink fewer caffeinated beverages. Alcohol and caffeine may urge you to  smoke.  Avoid foods and drinks that can trigger a desire to smoke, such as sugary or spicy foods and alcohol.  Ask people who smoke not to smoke around you.  Have something planned to do right after eating or having a cup of coffee. For example, plan to take a walk or exercise.  Try a relaxation exercise to calm you down and decrease your stress. Remember, you may be tense and nervous for the first 2 weeks after you quit, but this will pass.  Find new activities to keep your hands busy. Play with a pen, coin, or rubber band. Doodle or draw things on paper.  Brush your teeth right after eating. This will help cut down on the craving for the taste of tobacco after meals. You can also try mouthwash.   Use oral substitutes in place of cigarettes. Try using lemon drops, carrots, cinnamon sticks, or chewing gum. Keep them handy so they are available when you have the urge to smoke.  When you have the urge to smoke, try deep breathing.  Designate your home as a nonsmoking area.  If you are a heavy smoker, ask your health care provider about a prescription for nicotine chewing gum. It can ease your withdrawal from nicotine.  Reward yourself. Set aside the cigarette money you save and buy yourself something nice.  Look for support from others. Join a support group or smoking cessation program. Ask someone at home or at work to help you with your plan   to quit smoking.  Always ask yourself, "Do I need this cigarette or is this just a reflex?" Tell yourself, "Today, I choose not to smoke," or "I do not want to smoke." You are reminding yourself of your decision to quit.  Do not replace cigarette smoking with electronic cigarettes (commonly called e-cigarettes). The safety of e-cigarettes is unknown, and some may contain harmful chemicals.  If you relapse, do not give up! Plan ahead and think about what you will do the next time you get the urge to smoke. HOW WILL I FEEL WHEN I QUIT SMOKING? You  may have symptoms of withdrawal because your body is used to nicotine (the addictive substance in cigarettes). You may crave cigarettes, be irritable, feel very hungry, cough often, get headaches, or have difficulty concentrating. The withdrawal symptoms are only temporary. They are strongest when you first quit but will go away within 10-14 days. When withdrawal symptoms occur, stay in control. Think about your reasons for quitting. Remind yourself that these are signs that your body is healing and getting used to being without cigarettes. Remember that withdrawal symptoms are easier to treat than the major diseases that smoking can cause.  Even after the withdrawal is over, expect periodic urges to smoke. However, these cravings are generally short lived and will go away whether you smoke or not. Do not smoke! WHAT RESOURCES ARE AVAILABLE TO HELP ME QUIT SMOKING? Your health care provider can direct you to community resources or hospitals for support, which may include:  Group support.  Education.  Hypnosis.  Therapy.   This information is not intended to replace advice given to you by your health care provider. Make sure you discuss any questions you have with your health care provider.   Document Released: 12/02/2003 Document Revised: 03/26/2014 Document Reviewed: 08/21/2012 Elsevier Interactive Patient Education 2016 [employer]  

## 2015-08-23 NOTE — Progress Notes (Signed)
Safety precautions to be maintained throughout the outpatient stay will include: orient to surroundings, keep bed in low position, maintain call bell within reach at all times, provide assistance with transfer out of bed and ambulation.  

## 2015-08-24 NOTE — Progress Notes (Signed)
Subjective:  Patient ID: [name], female    DOB: 12/02/1985  Age: 30 y.o. MRN: [id number]  CC: Leg Pain   HPI Simonne L Keirsey presents for presents today for  revauation. She has a long standing history greater than 9 years of right lower leg pain with collagen vascular disease and history of multiple PEs. The pain she describes is primarily in the right lower leg with associated swelling and pain that wakes her up at night with associated fatigue and color changes the right leg. The quality of the pain is aching exhausting uncomfortable tender and throbbing occasionally sharp maximum VAS score of 9 it does not appear to be influenced by time of day but is aggravated by bending climbing sitting standing and walking and alleviated by rest and medication management. She has had an extensive workup for this and presents with a diagnosis postphlebitic syndrome. She occasionally has some give way weakness and occasionally has some associated low back pain she has been on blood thinners for considerable period of time and is status post a Greenfield filter with a history of multiple PEs.    Today she reports no significant changes in the quality characteristic or distribution of her pain since her last visit.. She is taking her medications as prescribed with no diverting or illicit use.  History Tenelle has a past medical history of Pulmonary embolism (HCC) and Collagen vascular disease (HCC).   She has past surgical history that includes Abdominal hysterectomy; tubal ligaton; and IVC filter.   Her family history includes COPD in her father and mother; Diabetes in her mother; Hypertension in her mother; Stroke in her father.She reports that she has been smoking Cigarettes.  She has a 8.5 pack-year smoking history. She does not have any smokeless tobacco history on file. She reports that she does not drink alcohol or use illicit  drugs.   ---------------------------------------------------------------------------------------------------------------------- Past Medical History  Diagnosis Date  . Pulmonary embolism (HCC)   . Collagen vascular disease (HCC)     HX: RT blood clot in leg, PE    Past Surgical History  Procedure Laterality Date  . Abdominal hysterectomy    . Tubal ligaton    . Ivc filter      Family History  Problem Relation Age of Onset  . Diabetes Mother   . Hypertension Mother   . COPD Mother   . COPD Father   . Stroke Father     Social History  Substance Use Topics  . Smoking status: Current Every Day Smoker -- 0.50 packs/day for 17 years    Types: Cigarettes  . Smokeless tobacco: Not on file  . Alcohol Use: No    ---------------------------------------------------------------------------------------------------------------------- Social History   Social History  . Marital Status: Single    Spouse Name: N/A  . Number of Children: N/A  . Years of Education: N/A   Social History Main Topics  . Smoking status: Current Every Day Smoker -- 0.50 packs/day for 17 years    Types: Cigarettes  . Smokeless tobacco: None  . Alcohol Use: No  . Drug Use: No  . Sexual Activity: Not Asked   Other Topics Concern  . None   Social History Narrative      ----------------------------------------------------------------------------------------------------------------------  ROS Review of Systems  Cardiac: On blood thinners Pulmonary: Smoker Neurologic: Negative Psychologic: Anxiety GI: Reflux Hematologic: Easy bruising and history of coagulation disorder   Objective:  BP 106/66 mmHg  Pulse 105  Temp(Src) 97.8 F (36.6 C)  Resp 18  Ht  (1.651 m)  Wt 234 lb (106.142 kg)  BMI 38.94 kg/m2  SpO2 97%  Physical Exam  Patient is alert and oriented 3 Pupils are equally round and reactive to light extraocular muscles are intact Heart is regular rate and rhythm without  murmur No change in lower extremity strength exam noted today   Assessment & Plan:   Komal was seen today for leg pain.  Diagnoses and all orders for this visit:  Postphlebitic syndrome  Arthralgia of lower leg, unspecified laterality  DDD (degenerative disc disease), lumbar  Other orders -     oxyCODONE-acetaminophen (PERCOCET/ROXICET) 5-325 MG tablet; Take 1 tablet by mouth every 4 (four) hours as needed.     ----------------------------------------------------------------------------------------------------------------------  Problem List Items Addressed This Visit      Cardiovascular and Mediastinum   Postphlebitic syndrome - Primary     Musculoskeletal and Integument   DDD (degenerative disc disease), lumbar   Relevant Medications   oxyCODONE-acetaminophen (PERCOCET/ROXICET) 5-325 MG tablet    Other Visit Diagnoses    Arthralgia of lower leg, unspecified laterality           ----------------------------------------------------------------------------------------------------------------------  1. Postphlebitic syndrome She has failed conservative therapy and has required opioid medication management in the past relief. We've talked about some different options available to her. I do not think she is a candidate injection therapy. We have been asked to assist her primary care physicians with monitoring her opioid usage. I have also reviewed her urine tox screen today. We will refill her Percocet today. She is 2 days early for her return appointment and is scheduled to go out of town so her refill will be for 2 days early and we will put her back on track next month  .2. Arthralgia of lower leg, unspecified laterality As above  - 3. DDD (degenerative disc disease), lumbar Ultimately she may be a candidate for an epidural injection. -  ----------------------------------------------------------------------------------------------------------------------  I am having  Ms. Hassan maintain her clonazePAM, apixaban, omeprazole, and oxyCODONE-acetaminophen.   Meds ordered this encounter  Medications  . oxyCODONE-acetaminophen (PERCOCET/ROXICET) 5-325 MG tablet    Sig: Take 1 tablet by mouth every 4 (four) hours as needed.    Dispense:  75 tablet    Refill:  0       Follow-up: Return in about 1 month (around 09/22/2015) for evaluation, med refill.    Yevette Edwards, MD

## 2015-09-26 ENCOUNTER — Encounter: Payer: Self-pay | Admitting: Anesthesiology

## 2015-09-26 ENCOUNTER — Ambulatory Visit: Payer: Medicaid Other | Attending: Anesthesiology | Admitting: Anesthesiology

## 2015-09-26 VITALS — BP 124/56 | HR 96 | Temp 97.9°F | Resp 16 | Ht 65.0 in | Wt 235.0 lb

## 2015-09-26 DIAGNOSIS — M25569 Pain in unspecified knee: Secondary | ICD-10-CM

## 2015-09-26 DIAGNOSIS — F17210 Nicotine dependence, cigarettes, uncomplicated: Secondary | ICD-10-CM | POA: Insufficient documentation

## 2015-09-26 DIAGNOSIS — I87009 Postthrombotic syndrome without complications of unspecified extremity: Secondary | ICD-10-CM | POA: Diagnosis not present

## 2015-09-26 DIAGNOSIS — M79606 Pain in leg, unspecified: Secondary | ICD-10-CM | POA: Diagnosis present

## 2015-09-26 DIAGNOSIS — M2550 Pain in unspecified joint: Secondary | ICD-10-CM | POA: Insufficient documentation

## 2015-09-26 DIAGNOSIS — Z86711 Personal history of pulmonary embolism: Secondary | ICD-10-CM | POA: Diagnosis not present

## 2015-09-26 DIAGNOSIS — M5136 Other intervertebral disc degeneration, lumbar region: Secondary | ICD-10-CM | POA: Insufficient documentation

## 2015-09-26 DIAGNOSIS — R079 Chest pain, unspecified: Secondary | ICD-10-CM | POA: Diagnosis present

## 2015-09-26 MED ORDER — OXYCODONE-ACETAMINOPHEN 5-325 MG PO TABS: 1.0000 | ORAL_TABLET | ORAL | Status: DC | PRN

## 2015-09-26 NOTE — Patient Instructions (Signed)
You were given a prescription for Percocet today. 

## 2015-09-26 NOTE — Progress Notes (Signed)
Safety precautions to be maintained throughout the outpatient stay will include: orient to surroundings, keep bed in low position, maintain call bell within reach at all times, provide assistance with transfer out of bed and ambulation.  

## 2015-09-27 NOTE — Progress Notes (Signed)
Subjective:  Patient ID: [name], female    DOB: 10/18/1985  Age: 30 y.o. MRN: [id number]  CC: Leg Pain and Chest Pain   HPI Dimitra L Pavek presents for presents today for  revauation. She has a long standing history greater than 9 years of right lower leg pain with collagen vascular disease and history of multiple PEs. The pain she describes is primarily in the right lower leg with associated swelling and pain that wakes her up at night with associated fatigue and color changes the right leg. The quality of the pain is aching exhausting uncomfortable tender and throbbing occasionally sharp maximum VAS score of 9 it does not appear to be influenced by time of day but is aggravated by bending climbing sitting standing and walking and alleviated by rest and medication management. She has had an extensive workup for this and presents with a diagnosis postphlebitic syndrome. She occasionally has some give way weakness and occasionally has some associated low back pain she has been on blood thinners for considerable period of time and is status post a Greenfield filter with a history of multiple PEs.    Today she reports no significant changes in the quality characteristic or distribution of her pain since her last visitOne month ago.. She is taking her medications as prescribed with no diverting or illicit use.  History Anadia has a past medical history of Pulmonary embolism (HCC) and Collagen vascular disease (HCC).   She has past surgical history that includes Abdominal hysterectomy; tubal ligaton; and IVC filter.   Her family history includes COPD in her father and mother; Diabetes in her mother; Hypertension in her mother; Stroke in her father.She reports that she has been smoking Cigarettes.  She has a 8.5 pack-year smoking history. She does not have any smokeless tobacco history on file. She reports that she does not drink alcohol or use illicit  drugs.   ---------------------------------------------------------------------------------------------------------------------- Past Medical History  Diagnosis Date  . Pulmonary embolism (HCC)   . Collagen vascular disease (HCC)     HX: RT blood clot in leg, PE    Past Surgical History  Procedure Laterality Date  . Abdominal hysterectomy    . Tubal ligaton    . Ivc filter      Family History  Problem Relation Age of Onset  . Diabetes Mother   . Hypertension Mother   . COPD Mother   . COPD Father   . Stroke Father     Social History  Substance Use Topics  . Smoking status: Current Every Day Smoker -- 0.50 packs/day for 17 years    Types: Cigarettes  . Smokeless tobacco: Not on file  . Alcohol Use: No    ---------------------------------------------------------------------------------------------------------------------- Social History   Social History  . Marital Status: Single    Spouse Name: N/A  . Number of Children: N/A  . Years of Education: N/A   Social History Main Topics  . Smoking status: Current Every Day Smoker -- 0.50 packs/day for 17 years    Types: Cigarettes  . Smokeless tobacco: None  . Alcohol Use: No  . Drug Use: No  . Sexual Activity: Not Asked   Other Topics Concern  . None   Social History Narrative      ----------------------------------------------------------------------------------------------------------------------  ROS Review of Systems    Objective:  BP 124/56 mmHg  Pulse 96  Temp(Src) 97.9 F (36.6 C) (Oral)  Resp 16  Ht 5\' 5"  (1.651 m)  Wt 235 lb (106.595 kg)  BMI 39.11 kg/m2  SpO2 99%  Physical Exam  Patient is alert and oriented 3 Pupils are equally round and reactive to light extraocular muscles are intact Heart is regular rate and rhythm without murmur No change in lower extremity strength exam noted today   Assessment & Plan:   Deondrea was seen today for leg pain and chest pain.  Diagnoses and all  orders for this visit:  Postphlebitic syndrome  Arthralgia of lower leg, unspecified laterality  DDD (degenerative disc disease), lumbar  Other orders -     oxyCODONE-acetaminophen (PERCOCET/ROXICET) 5-325 MG tablet; Take 1 tablet by mouth every 4 (four) hours as needed.     ----------------------------------------------------------------------------------------------------------------------  Problem List Items Addressed This Visit      Cardiovascular and Mediastinum   Postphlebitic syndrome - Primary     Musculoskeletal and Integument   DDD (degenerative disc disease), lumbar   Relevant Medications   oxyCODONE-acetaminophen (PERCOCET/ROXICET) 5-325 MG tablet    Other Visit Diagnoses    Arthralgia of lower leg, unspecified laterality           ----------------------------------------------------------------------------------------------------------------------  1. Postphlebitic syndrome She has failed conservative therapy and has required opioid medication management in the past relief. We've talked about some different options available to her. I do not think she is a candidate injection therapy. We have been asked to assist her primary care physicians with monitoring her opioid usage. I have also reviewed her urine tox screen today. We will refill her Percocet today..2. Arthralgia of lower leg, unspecified laterality As above  - 3. DDD (degenerative disc disease), lumbar Ultimately she may be a candidate for an epidural injection. -  ----------------------------------------------------------------------------------------------------------------------  I am having Ms. Missildine maintain her clonazePAM, apixaban, omeprazole, and oxyCODONE-acetaminophen.   Meds ordered this encounter  Medications  . oxyCODONE-acetaminophen (PERCOCET/ROXICET) 5-325 MG tablet    Sig: Take 1 tablet by mouth every 4 (four) hours as needed.    Dispense:  75 tablet    Refill:  0        Follow-up: Return in about 1 month (around 10/27/2015) for evaluation, med refill.    Yevette Edwards, MD

## 2015-10-13 ENCOUNTER — Ambulatory Visit: Payer: Medicaid Other | Attending: Anesthesiology | Admitting: Anesthesiology

## 2015-10-13 ENCOUNTER — Encounter: Payer: Self-pay | Admitting: Anesthesiology

## 2015-10-13 VITALS — BP 96/67 | HR 102 | Temp 98.5°F | Resp 16 | Ht 65.0 in | Wt 235.0 lb

## 2015-10-13 DIAGNOSIS — Z823 Family history of stroke: Secondary | ICD-10-CM | POA: Diagnosis not present

## 2015-10-13 DIAGNOSIS — Z86711 Personal history of pulmonary embolism: Secondary | ICD-10-CM | POA: Insufficient documentation

## 2015-10-13 DIAGNOSIS — F17210 Nicotine dependence, cigarettes, uncomplicated: Secondary | ICD-10-CM | POA: Diagnosis not present

## 2015-10-13 DIAGNOSIS — M25569 Pain in unspecified knee: Secondary | ICD-10-CM | POA: Diagnosis not present

## 2015-10-13 DIAGNOSIS — I87009 Postthrombotic syndrome without complications of unspecified extremity: Secondary | ICD-10-CM | POA: Insufficient documentation

## 2015-10-13 DIAGNOSIS — M5136 Other intervertebral disc degeneration, lumbar region: Secondary | ICD-10-CM | POA: Insufficient documentation

## 2015-10-13 DIAGNOSIS — M2550 Pain in unspecified joint: Secondary | ICD-10-CM | POA: Insufficient documentation

## 2015-10-13 DIAGNOSIS — M79604 Pain in right leg: Secondary | ICD-10-CM | POA: Diagnosis present

## 2015-10-13 MED ORDER — OXYCODONE-ACETAMINOPHEN 5-325 MG PO TABS: 1.0000 | ORAL_TABLET | ORAL | 0 refills | Status: DC | PRN

## 2015-10-13 NOTE — Patient Instructions (Signed)
You were given one prescription for Percocet today. 

## 2015-10-13 NOTE — Progress Notes (Signed)
Safety precautions to be maintained throughout the outpatient stay will include: orient to surroundings, keep bed in low position, maintain call bell within reach at all times, provide assistance with transfer out of bed and ambulation.  

## 2015-10-14 NOTE — Progress Notes (Signed)
Subjective:  Patient ID: [name], female    DOB: Jul 28, 1985  Age: 30 y.o. MRN: [id number]  CC: Leg Pain (right) and Pain (right torso)   HPI Viridiana L Kowalczyk presents for presents today for  revauation.She was last seen several weeks ago and continues to do well with her current medication regimen. She reports no change in the quality characteristic or distribution of her lower extremity pain. Furthermore she is taking her medications as prescribed and these are reportedly working well. She shows no evidence of diverting or illicit use. No side effects are reported today.   History Christon has a past medical history of Collagen vascular disease (HCC) and Pulmonary embolism (HCC).   She has a past surgical history that includes Abdominal hysterectomy; tubal ligaton; and IVC filter.   Her family history includes COPD in her father and mother; Diabetes in her mother; Hypertension in her mother; Stroke in her father.She reports that she has been smoking Cigarettes.  She has a 8.50 pack-year smoking history. She does not have any smokeless tobacco history on file. She reports that she does not drink alcohol or use drugs.   ---------------------------------------------------------------------------------------------------------------------- Past Medical History:  Diagnosis Date  . Collagen vascular disease (HCC)    HX: RT blood clot in leg, PE  . Pulmonary embolism (HCC)     Past Surgical History:  Procedure Laterality Date  . ABDOMINAL HYSTERECTOMY    . IVC filter    . tubal ligaton      Family History  Problem Relation Age of Onset  . Diabetes Mother   . Hypertension Mother   . COPD Mother   . COPD Father   . Stroke Father     Social History  Substance Use Topics  . Smoking status: Current Every Day Smoker    Packs/day: 0.50    Years: 17.00    Types: Cigarettes  . Smokeless tobacco: Not on file  . Alcohol use No     ---------------------------------------------------------------------------------------------------------------------- Social History   Social History  . Marital status: Single    Spouse name: N/A  . Number of children: N/A  . Years of education: N/A   Social History Main Topics  . Smoking status: Current Every Day Smoker    Packs/day: 0.50    Years: 17.00    Types: Cigarettes  . Smokeless tobacco: None  . Alcohol use No  . Drug use: No  . Sexual activity: Not Asked   Other Topics Concern  . None   Social History Narrative  . None      ----------------------------------------------------------------------------------------------------------------------  ROS Review of Systems    Objective:  BP 96/67   Pulse (!) 102   Temp 98.5 F (36.9 C) (Oral)   Resp 16   Ht  (1.651 m)   Wt 235 lb (106.6 kg)   SpO2 97%   BMI 39.11 kg/m   Physical Exam  Patient is alert and oriented 3 Pupils are equally round and reactive to light extraocular muscles are intact Heart is regular rate and rhythm without murmur No change in lower extremity strength exam noted today   Assessment & Plan:   Laquenta was seen today for leg pain and pain.  Diagnoses and all orders for this visit:  Postphlebitic syndrome  Arthralgia of lower leg, unspecified laterality  DDD (degenerative disc disease), lumbar  Other orders -     oxyCODONE-acetaminophen (PERCOCET/ROXICET) 5-325 MG tablet; Take 1 tablet by mouth every 4 (four) hours as needed.     ----------------------------------------------------------------------------------------------------------------------  Problem List Items Addressed This Visit      Cardiovascular and Mediastinum   Postphlebitic syndrome - Primary     Musculoskeletal and Integument   DDD (degenerative disc disease), lumbar   Relevant Medications   oxyCODONE-acetaminophen (PERCOCET/ROXICET) 5-325 MG tablet    Other Visit Diagnoses    Arthralgia  of lower leg, unspecified laterality          ----------------------------------------------------------------------------------------------------------------------  1. Postphlebitic syndrome She has failed conservative therapy and has required opioid medication management in the past relief. We've talked about some different options available to her. I do not think she is a candidate injection therapy. We have been asked to assist her primary care physicians with monitoring her opioid usage. I have also reviewed her urine tox screen today. We will refill her Percocet today..  2. Arthralgia of lower leg, unspecified laterality As above  - 3. DDD (degenerative disc disease), lumbar Ultimately she may be a candidate for an epidural injection. -  ----------------------------------------------------------------------------------------------------------------------  I am having Ms. Gramling maintain her clonazePAM, apixaban, omeprazole, and oxyCODONE-acetaminophen.   Meds ordered this encounter  Medications  . oxyCODONE-acetaminophen (PERCOCET/ROXICET) 5-325 MG tablet    Sig: Take 1 tablet by mouth every 4 (four) hours as needed.    Dispense:  75 tablet    Refill:  0    Do not fill until [id number]       Follow-up: Return in about 1 month (around 11/13/2015) for evaluation, med refill.    Yevette Edwards, MD

## 2015-11-08 ENCOUNTER — Encounter: Payer: Medicaid Other | Admitting: Anesthesiology

## 2015-11-10 ENCOUNTER — Emergency Department
Admission: EM | Admit: 2015-11-10 | Discharge: 2015-11-10 | Disposition: A | Discharge status: Home / Self Care | Payer: Medicaid Other | Attending: Emergency Medicine | Admitting: Emergency Medicine

## 2015-11-10 ENCOUNTER — Emergency Department: Payer: Medicaid Other

## 2015-11-10 ENCOUNTER — Encounter: Payer: Self-pay | Admitting: Emergency Medicine

## 2015-11-10 DIAGNOSIS — Z79899 Other long term (current) drug therapy: Secondary | ICD-10-CM | POA: Diagnosis not present

## 2015-11-10 DIAGNOSIS — F17210 Nicotine dependence, cigarettes, uncomplicated: Secondary | ICD-10-CM | POA: Diagnosis not present

## 2015-11-10 DIAGNOSIS — M545 Low back pain, unspecified: Secondary | ICD-10-CM

## 2015-11-10 DIAGNOSIS — N390 Urinary tract infection, site not specified: Secondary | ICD-10-CM | POA: Insufficient documentation

## 2015-11-10 DIAGNOSIS — M549 Dorsalgia, unspecified: Secondary | ICD-10-CM | POA: Diagnosis present

## 2015-11-10 LAB — URINALYSIS COMPLETE WITH MICROSCOPIC (ARMC ONLY)
Bilirubin Urine: NEGATIVE
GLUCOSE, UA: NEGATIVE mg/dL
KETONES UR: NEGATIVE mg/dL
Leukocytes, UA: NEGATIVE
NITRITE: NEGATIVE
Protein, ur: 30 mg/dL — AB
SPECIFIC GRAVITY, URINE: 1.027 (ref 1.005–1.030)
pH: 5 (ref 5.0–8.0)

## 2015-11-10 MED ORDER — SULFAMETHOXAZOLE-TRIMETHOPRIM 800-160 MG PO TABS: 1.0000 | ORAL_TABLET | Freq: Two times a day (BID) | ORAL | 0 refills | Status: DC

## 2015-11-10 MED ORDER — TIZANIDINE HCL 4 MG PO TABS: 4.0000 mg | ORAL_TABLET | Freq: Three times a day (TID) | ORAL | 0 refills | Status: DC

## 2015-11-10 MED ORDER — ORPHENADRINE CITRATE 30 MG/ML IJ SOLN
60.0000 mg | Freq: Once | INTRAMUSCULAR | Status: AC
  Administered 2015-11-10: 60 mg via INTRAMUSCULAR
  Filled 2015-11-10: qty 2

## 2015-11-10 NOTE — ED Triage Notes (Signed)
Patient presents to the ED with lower back pain x 2 days.  Patient denies injury.  Patient reports history of similar back pain but states it has never been this severe.

## 2015-11-10 NOTE — ED Provider Notes (Signed)
[facility] Emergency Department Provider Note ____________________________________________  Time seen: Approximately 7:45 AM  I have reviewed the triage vital signs and the nursing notes.   HISTORY  Chief Complaint Back Pain    HPI [name] is a 30 y.o. female who presents to the emergency department for evaluation of back pain. Pain started 2 days ago and has been unrelieved by Flexeril or Percocet. She denies injury. She reports a history of pain in the same location, however never this bad.  Past Medical History:  Diagnosis Date  . Collagen vascular disease (HCC)    HX: RT blood clot in leg, PE  . Pulmonary embolism [facility](HCC)     Patient Active Problem List   Diagnosis Date Noted  . 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 DDD (degenerative disc disease), lumbar 04/21/2015  . Obesity, Class II, BMI 35-39.9 04/21/2015  . Anxiety, generalized 02/04/2015  . Hx pulmonary embolism 08/16/2014  . Acid reflux 03/25/2014  . Current tobacco use 03/25/2014  . H/O deep venous thrombosis 12/01/2013    Past Surgical History:  Procedure Laterality Date  . ABDOMINAL HYSTERECTOMY    . IVC filter    . tubal ligaton      Prior to Admission medications   Medication Sig Start Date End Date Taking? Authorizing Provider  cyclobenzaprine (FLEXERIL) 10 MG tablet Take 10 mg by mouth 3 (three) times daily as needed for muscle spasms.   Yes Historical Provider, MD  apixaban (ELIQUIS) 5 MG TABS tablet Take 5 mg by mouth 2 (two) times daily.    Historical Provider, MD  clonazePAM (KLONOPIN) 0.5 MG tablet Take 1 mg by mouth 2 (two) times daily.     Historical Provider, MD  omeprazole (PRILOSEC) 40 MG capsule Take 1 capsule (40 mg total) by mouth daily. 04/21/15   Schuyler AmorWilliam Plonk, MD  oxyCODONE-acetaminophen (PERCOCET/ROXICET) 5-325 MG tablet Take 1 tablet by mouth every 4 (four) hours as needed. 10/13/15   Yevette EdwardsJames G Adams, MD  sulfamethoxazole-trimethoprim (BACTRIM DS,SEPTRA DS)  800-160 MG tablet Take 1 tablet by mouth 2 (two) times daily. 11/10/15   Chinita Pesterari B Tristian Sickinger, FNP  tiZANidine (ZANAFLEX) 4 MG tablet Take 1 tablet (4 mg total) by mouth 3 (three) times daily. 11/10/15   Chinita Pesterari B Cristella Stiver, FNP    Allergies Penicillins; Xarelto [rivaroxaban]; and Ibuprofen  Family History  Problem Relation Age of Onset  . Diabetes Mother   . Hypertension Mother   . COPD Mother   . COPD Father   . Stroke Father     Social History Social History  Substance Use Topics  . Smoking status: Current Every Day Smoker    Packs/day: 0.50    Years: 17.00    Types: Cigarettes  . Smokeless tobacco: Never Used  . Alcohol use No    Review of Systems Constitutional: No recent illness. Cardiovascular: Denies chest pain or palpitations. Respiratory: Denies shortness of breath. Musculoskeletal: Pain in lower back Skin: Negative for rash, wound, lesion. Neurological: Negative for focal weakness or numbness.  ____________________________________________   PHYSICAL EXAM:  VITAL SIGNS: ED Triage Vitals  Enc Vitals Group     BP 11/10/15 0725 116/69     Pulse Rate 11/10/15 0725 (!) 103     Resp 11/10/15 0725 16     Temp 11/10/15 0725 97.8 F (36.6 C)     Temp Source 11/10/15 0725 Oral     SpO2 11/10/15 0725 99 %     Weight 11/10/15 0723 235 lb (106.6 kg)  Height 11/10/15 0723 5\' 5"  (1.651 m)     Head Circumference --      Peak Flow --      Pain Score 11/10/15 0723 7     Pain Loc --      Pain Edu? --      Excl. in GC? --     Constitutional: Alert and oriented. Well appearing and in no acute distress. Eyes: Conjunctivae are normal. EOMI. Head: Atraumatic. Neck: No stridor.  Respiratory: Normal respiratory effort.   Musculoskeletal: Limited ROM at lumbar due to pain. No focal midline tenderness. No step off. No CVA tenderness. Neurologic:  Normal speech and language. No gross focal neurologic deficits are appreciated. Speech is normal. No gait instability. Skin:  Skin  is warm, dry and intact. Atraumatic. Psychiatric: Mood and affect are normal. Speech and behavior are normal.  ____________________________________________   LABS (all labs ordered are listed, but only abnormal results are displayed)  Labs Reviewed  URINALYSIS COMPLETEWITH MICROSCOPIC ([facility] ONLY) - Abnormal; Notable for the following:       Result Value   Color, Urine YELLOW (*)    APPearance CLEAR (*)    Hgb urine dipstick 1+ (*)    Protein, ur 30 (*)    Bacteria, UA MANY (*)    Squamous Epithelial / LPF 0-5 (*)    All other components within normal limits   ____________________________________________  RADIOLOGY  Mild facet arthropathy at L5-S1, otherwise images of the lumbar spine are unremarkable per radiology. ____________________________________________   PROCEDURES  Procedure(s) performed: None   ____________________________________________   INITIAL IMPRESSION / ASSESSMENT AND PLAN / ED COURSE  Pertinent labs & imaging results that were available during my care of the patient were reviewed by me and considered in my medical decision making (see chart for details).  Patient was given IM Norflex while in the emergency Department with little relief. She will be discharged home with prescriptions for Zanaflex and Bactrim. She was instructed to call and schedule a follow-up appointment with orthopedics for back pain that continues after she has completed the Bactrim. She was advised to return to the emergency department for symptoms that change or worsen if unable schedule an appointment. ____________________________________________   FINAL CLINICAL IMPRESSION(S) / ED DIAGNOSES  Final diagnoses:  Acute lumbar back pain  UTI (lower urinary tract infection)       Chinita PesterCari B Aniaya Bacha, FNP 11/10/15 [id number]    Minna AntisKevin Paduchowski, MD 11/26/15 2254

## 2015-11-10 NOTE — ED Notes (Signed)
States she is having lower back pain for the past 2-3 days   Denies any injury  States pain is mainly mid lower back  Non radiating denies any fever or urinary sxs' has taken flexeril and percocet w/o relief

## 2016-06-28 DIAGNOSIS — R768 Other specified abnormal immunological findings in serum: Secondary | ICD-10-CM | POA: Diagnosis present

## 2016-12-10 ENCOUNTER — Ambulatory Visit: Payer: Medicaid Other | Admitting: Family Medicine

## 2018-04-28 DIAGNOSIS — E785 Hyperlipidemia, unspecified: Secondary | ICD-10-CM | POA: Diagnosis present

## 2018-04-28 DIAGNOSIS — E1169 Type 2 diabetes mellitus with other specified complication: Secondary | ICD-10-CM | POA: Diagnosis present

## 2018-04-28 DIAGNOSIS — E118 Type 2 diabetes mellitus with unspecified complications: Secondary | ICD-10-CM | POA: Diagnosis present

## 2019-06-20 ENCOUNTER — Encounter: Payer: Self-pay | Admitting: Emergency Medicine

## 2019-06-20 ENCOUNTER — Ambulatory Visit
Admission: EM | Admit: 2019-06-20 | Discharge: 2019-06-20 | Disposition: A | Discharge status: Home / Self Care | Payer: Medicaid Other | Attending: Emergency Medicine | Admitting: Emergency Medicine

## 2019-06-20 ENCOUNTER — Other Ambulatory Visit: Payer: Self-pay

## 2019-06-20 DIAGNOSIS — K130 Diseases of lips: Secondary | ICD-10-CM | POA: Insufficient documentation

## 2019-06-20 HISTORY — DX: Type 2 diabetes mellitus without complications: E11.9

## 2019-06-20 LAB — GLUCOSE, CAPILLARY: Glucose-Capillary: 242 mg/dL — ABNORMAL HIGH (ref 70–99)

## 2019-06-20 LAB — WET PREP, GENITAL
Clue Cells Wet Prep HPF POC: NONE SEEN
Sperm: NONE SEEN
Trich, Wet Prep: NONE SEEN
WBC, Wet Prep HPF POC: NONE SEEN
Yeast Wet Prep HPF POC: NONE SEEN

## 2019-06-20 MED ORDER — TRIAMCINOLONE ACETONIDE 0.025 % EX OINT: 1.0000 "application " | TOPICAL_OINTMENT | Freq: Two times a day (BID) | CUTANEOUS | 0 refills | Status: AC

## 2019-06-20 MED ORDER — MUPIROCIN 2 % EX OINT: 1.0000 "application " | TOPICAL_OINTMENT | Freq: Two times a day (BID) | CUTANEOUS | 0 refills | Status: DC

## 2019-06-20 MED ORDER — FLUCONAZOLE 150 MG PO TABS: 150.0000 mg | ORAL_TABLET | Freq: Once | ORAL | 1 refills | Status: AC

## 2019-06-20 MED ORDER — NYSTATIN 100000 UNIT/GM EX OINT: 1.0000 "application " | TOPICAL_OINTMENT | Freq: Two times a day (BID) | CUTANEOUS | 0 refills | Status: DC

## 2019-06-20 NOTE — ED Provider Notes (Signed)
HPI  SUBJECTIVE:  [name] is a 34 y.o. female who presents with 1-1/2 to 2 weeks of an erythematous pruritic burning rash along her external genitalia and in the crease in between her mons and thigh.  It has been intermittent for the past month starting after taking some antibiotics for UTI.  She denies fevers, polyuria, increased polydipsia, unintentional weight loss.  She states that her glucose has been running higher than normal in the 300s to 400s.  She is unsure what her normal range is.  She denies dysuria except when the urine touches the external labia.  No other urinary complaints.  No blisters, crusting.  No internal itching vaginal discharge bleeding or odor.  States that she is compliant with her Metformin.  She has never had symptoms like this before.  She tried Monistat cream which made things worse.  She has also tried Desitin.  She started nystatin powder last night with some improvement in her symptoms.  Symptoms are also worse with scratching.  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  She is status post hysterectomy.  No history of hypertension.  PMD: [facility].    Past Medical History:  Diagnosis Date  . Collagen vascular disease (HCC)    HX: RT blood clot in leg, PE  . Diabetes mellitus without complication (HCC)   . Pulmonary embolism [facility])     Past Surgical History:  Procedure Laterality Date  . ABDOMINAL HYSTERECTOMY    . IVC filter    . tubal ligaton      Family History  Problem Relation Age of Onset  . Diabetes Mother   . Hypertension Mother   . COPD Mother   . COPD Father   . Stroke Father     Social History   Tobacco Use  . Smoking status: Current Every Day Smoker    Packs/day: 0.50    Years: 17.00    Pack years: 8.50    Types: Cigarettes  . Smokeless tobacco: Never Used  Substance Use Topics  . Alcohol use: No    Alcohol/week: 0.0 standard drinks  . Drug use: No    No  current facility-administered medications for this encounter.  Current Outpatient Medications:  .  apixaban (ELIQUIS) 5 MG TABS tablet, Take 5 mg by mouth 2 (two) times daily., Disp: , Rfl:  .  Buprenorphine HCl-Naloxone HCl (SUBOXONE) 8-2 MG FILM, Place under the tongue., Disp: , Rfl:  .  metFORMIN (GLUCOPHAGE) 500 MG tablet, Take by mouth daily with breakfast., Disp: , Rfl:  .  omeprazole (PRILOSEC) 40 MG capsule, Take 1 capsule (40 mg total) by mouth daily., Disp: 90 capsule, Rfl: 3 .  fluconazole (DIFLUCAN) 150 MG tablet, Take 1 tablet (150 mg total) by mouth once for 1 dose. Take second dose in 72 hours, Disp: 2 tablet, Rfl: 1 .  mupirocin ointment (BACTROBAN) 2 %, Apply 1 application topically 2 (two) times daily., Disp: 22 g, Rfl: 0 .  nystatin ointment (MYCOSTATIN), Apply 1 application topically 2 (two) times daily. Until symptoms resolve, Disp: 30 g, Rfl: 0 .  triamcinolone (KENALOG) 0.025 % ointment, Apply 1 application topically 2 (two) times daily for 14 days., Disp: 30 g, Rfl: 0  Allergies  Allergen Reactions  . Penicillins Other (See Comments)    Reaction:  Unknown  . Xarelto [Rivaroxaban] Other (See Comments)    Reaction: Pt said it "made me feel like I was drunk."  .  Ibuprofen Rash     ROS  As noted in HPI.   Physical Exam  BP (!) 107/50 (BP Location: Right Arm)   Pulse 75   Temp 98.2 F (36.8 C) (Oral)   Resp 18   Ht 5\' 5"  (1.651 m)   Wt 108.9 kg   SpO2 98%   BMI 39.94 kg/m   Constitutional: Well developed, well nourished, no acute distress Eyes:  EOMI, conjunctiva normal bilaterally HENT: Normocephalic, atraumatic,mucus membranes moist Respiratory: Normal inspiratory effort Cardiovascular: Normal rate GI: nondistended skin: Tender symmetric erythematous rash with maceration and excoriations in bilateral inguinal crease folds and external labia.  Nystatin powder present on top of this.  Rash is worse on the left compared to the right.  Bilateral  excoriations, erosions along the inner aspect of the outer labia.  No blisters, crusting.  Mild left labial swelling.  No induration.  No fluctuance.      Musculoskeletal: no deformities Neurologic: Alert & oriented x 3, no focal neuro deficits Psychiatric: Speech and behavior appropriate   ED Course   Medications - No data to display  Orders Placed This Encounter  Procedures  . Wet prep, genital    Standing Status:   Standing    Number of Occurrences:   1  . Glucose, capillary    Standing Status:   Standing    Number of Occurrences:   1  . CBG monitoring, ED    Standing Status:   Standing    Number of Occurrences:   1    Results for orders placed or performed during the hospital encounter of 06/20/19 (from the past 24 hour(s))  Wet prep, genital     Status: None   Collection Time: 06/20/19 12:13 PM  Result Value Ref Range   Yeast Wet Prep HPF POC NONE SEEN NONE SEEN   Trich, Wet Prep NONE SEEN NONE SEEN   Clue Cells Wet Prep HPF POC NONE SEEN NONE SEEN   WBC, Wet Prep HPF POC NONE SEEN NONE SEEN   Sperm NONE SEEN   Glucose, capillary     Status: Abnormal   Collection Time: 06/20/19 12:32 PM  Result Value Ref Range   Glucose-Capillary 242 (H) 70 - 99 mg/dL   No results found.  ED Clinical Impression  1. Intertrigo labialis      ED Assessment/Plan  Wet prep negative for yeast BV or Trichomonas.  Glucose 242, patient is not sure what her normal range is.  This appears to be intertrigo. it is bilateral worse than the left more so than the right.  Will send  home with instructions to wash herself with gentle soap, plenty of water, to dry herself off afterwards with a hair dryer, then apply an equal mix of nystatin, Bactroban  low potency triamcinolone ointments, she is then to apply barrier cream such as Desitin on top of all of this twice a day.  She will follow-up with her primary care physician in a week if this is not getting any better.  We will also send home  Diflucan 150 mg every 72 hours x2.  Discussed  MDM, treatment plan, and plan for follow-up with patient. patient agrees with plan.   Meds ordered this encounter  Medications  . fluconazole (DIFLUCAN) 150 MG tablet    Sig: Take 1 tablet (150 mg total) by mouth once for 1 dose. Take second dose in 72 hours    Dispense:  2 tablet    Refill:  1  .  nystatin ointment (MYCOSTATIN)    Sig: Apply 1 application topically 2 (two) times daily. Until symptoms resolve    Dispense:  30 g    Refill:  0  . triamcinolone (KENALOG) 0.025 % ointment    Sig: Apply 1 application topically 2 (two) times daily for 14 days.    Dispense:  30 g    Refill:  0  . mupirocin ointment (BACTROBAN) 2 %    Sig: Apply 1 application topically 2 (two) times daily.    Dispense:  22 g    Refill:  0    *This clinic note was created using [employer]. Therefore, there may be occasional mistakes despite careful proofreading.   ?   Melynda Ripple, MD 06/20/19 1301

## 2019-06-20 NOTE — ED Triage Notes (Signed)
Pt c/o rash and itching in the vaginal area and on the outside of her vaginal area and in the creases of her legs. Started about a month ago. She states that she does not have any itching internally. Denies any known discharge. She states that her blood sugar has been running high, last check 422.

## 2019-06-20 NOTE — Discharge Instructions (Addendum)
wash yourself with gentle soap, rinse with plenty of water, then dry yourself off afterwards with a hair dryer until skin is completely dried, then apply mix of equal parts of nystatin, Bactroban, triamcinolone ointments, then apply barrier cream such as Desitin on top of all of this.  I am also sending you home on Diflucan to help with the yeast.

## 2019-12-25 ENCOUNTER — Ambulatory Visit
Admission: EM | Admit: 2019-12-25 | Discharge: 2019-12-25 | Disposition: A | Discharge status: Home / Self Care | Payer: Medicaid Other | Attending: Family Medicine | Admitting: Family Medicine

## 2019-12-25 ENCOUNTER — Other Ambulatory Visit: Payer: Self-pay

## 2019-12-25 ENCOUNTER — Ambulatory Visit (INDEPENDENT_AMBULATORY_CARE_PROVIDER_SITE_OTHER): Payer: Medicaid Other

## 2019-12-25 ENCOUNTER — Encounter: Payer: Self-pay | Admitting: Emergency Medicine

## 2019-12-25 DIAGNOSIS — F17210 Nicotine dependence, cigarettes, uncomplicated: Secondary | ICD-10-CM | POA: Insufficient documentation

## 2019-12-25 DIAGNOSIS — Z7901 Long term (current) use of anticoagulants: Secondary | ICD-10-CM | POA: Diagnosis not present

## 2019-12-25 DIAGNOSIS — R059 Cough, unspecified: Secondary | ICD-10-CM | POA: Insufficient documentation

## 2019-12-25 DIAGNOSIS — E119 Type 2 diabetes mellitus without complications: Secondary | ICD-10-CM | POA: Insufficient documentation

## 2019-12-25 DIAGNOSIS — Z79899 Other long term (current) drug therapy: Secondary | ICD-10-CM | POA: Diagnosis not present

## 2019-12-25 DIAGNOSIS — Z86711 Personal history of pulmonary embolism: Secondary | ICD-10-CM | POA: Diagnosis not present

## 2019-12-25 DIAGNOSIS — Z20822 Contact with and (suspected) exposure to covid-19: Secondary | ICD-10-CM | POA: Diagnosis not present

## 2019-12-25 DIAGNOSIS — R0602 Shortness of breath: Secondary | ICD-10-CM | POA: Diagnosis not present

## 2019-12-25 DIAGNOSIS — K219 Gastro-esophageal reflux disease without esophagitis: Secondary | ICD-10-CM | POA: Insufficient documentation

## 2019-12-25 DIAGNOSIS — Z86718 Personal history of other venous thrombosis and embolism: Secondary | ICD-10-CM | POA: Insufficient documentation

## 2019-12-25 DIAGNOSIS — E669 Obesity, unspecified: Secondary | ICD-10-CM | POA: Diagnosis not present

## 2019-12-25 DIAGNOSIS — R0781 Pleurodynia: Secondary | ICD-10-CM | POA: Insufficient documentation

## 2019-12-25 DIAGNOSIS — J988 Other specified respiratory disorders: Secondary | ICD-10-CM | POA: Diagnosis not present

## 2019-12-25 DIAGNOSIS — J90 Pleural effusion, not elsewhere classified: Secondary | ICD-10-CM

## 2019-12-25 DIAGNOSIS — Z6838 Body mass index (BMI) 38.0-38.9, adult: Secondary | ICD-10-CM | POA: Diagnosis not present

## 2019-12-25 DIAGNOSIS — R062 Wheezing: Secondary | ICD-10-CM | POA: Diagnosis not present

## 2019-12-25 DIAGNOSIS — M5136 Other intervertebral disc degeneration, lumbar region: Secondary | ICD-10-CM | POA: Diagnosis not present

## 2019-12-25 DIAGNOSIS — Z7984 Long term (current) use of oral hypoglycemic drugs: Secondary | ICD-10-CM | POA: Diagnosis not present

## 2019-12-25 LAB — SARS CORONAVIRUS 2 (TAT 6-24 HRS): SARS Coronavirus 2: NEGATIVE

## 2019-12-25 MED ORDER — PREDNISONE 50 MG PO TABS: ORAL_TABLET | ORAL | 0 refills | Status: DC

## 2019-12-25 MED ORDER — ALBUTEROL SULFATE HFA 108 (90 BASE) MCG/ACT IN AERS: 1.0000 | INHALATION_SPRAY | Freq: Four times a day (QID) | RESPIRATORY_TRACT | 0 refills | Status: DC | PRN

## 2019-12-25 NOTE — ED Provider Notes (Signed)
[facility]    CSN: [id number] Arrival date & time: 12/25/19  1051  History   Chief Complaint Chief Complaint  Patient presents with  . Shortness of Breath  . Rib Pain  . Cough   HPI   34 year old female presents with the above complaints.  Patient reports that she is been symptomatic for the past 3 to 5 days.  She reports cough, shortness of breath, and lower rib pain bilaterally.  She has a history of pulmonary embolism.  She is currently on Eliquis.  She endorses compliance.  She states that she does not and has not missed any doses recently.  She is a current smoker.  She reports that she feels very poorly.  She states that her oxygen saturations have been low at home.  Currently satting at 94%.  No relieving factors.  No other associated symptoms.  No other complaints.  Past Medical History:  Diagnosis Date  . Collagen vascular disease (HCC)    HX: RT blood clot in leg, PE  . Diabetes mellitus without complication (HCC)   . Pulmonary embolism [facility])     Patient Active Problem List   Diagnosis Date Noted  . 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 DDD (degenerative disc disease), lumbar 04/21/2015  . Obesity, Class II, BMI 35-39.9 04/21/2015  . Anxiety, generalized 02/04/2015  . Hx pulmonary embolism 08/16/2014  . Acid reflux 03/25/2014  . Current tobacco use 03/25/2014  . H/O deep venous thrombosis 12/01/2013    Past Surgical History:  Procedure Laterality Date  . ABDOMINAL HYSTERECTOMY    . IVC filter    . tubal ligaton      OB History   No obstetric history on file.      Home Medications    Prior to Admission medications   Medication Sig Start Date End Date Taking? Authorizing Provider  apixaban (ELIQUIS) 5 MG TABS tablet Take 5 mg by mouth 2 (two) times daily.   Yes [provider]  Buprenorphine HCl-Naloxone HCl (SUBOXONE) 8-2 MG FILM Place under the tongue.   Yes [provider]  metFORMIN (GLUCOPHAGE) 500 MG tablet Take by  mouth daily with breakfast.   Yes [provider]  omeprazole (PRILOSEC) 40 MG capsule Take 1 capsule (40 mg total) by mouth daily. 04/21/15  Yes Plonk, Chrissie Noa, MD  albuterol (VENTOLIN HFA) 108 (90 Base) MCG/ACT inhaler Inhale 1-2 puffs into the lungs every 6 (six) hours as needed for wheezing or shortness of breath. 12/25/19   Tommie Sams, DO  mupirocin ointment (BACTROBAN) 2 % Apply 1 application topically 2 (two) times daily. 06/20/19   Domenick Gong, MD  nystatin ointment (MYCOSTATIN) Apply 1 application topically 2 (two) times daily. Until symptoms resolve 06/20/19   Domenick Gong, MD  predniSONE (DELTASONE) 50 MG tablet 1 tablet daily x 5 days 12/25/19   Tommie Sams, DO  clonazePAM (KLONOPIN) 0.5 MG tablet Take 1 mg by mouth 2 (two) times daily.   06/20/19  [provider]  oxyCODONE-acetaminophen (PERCOCET/ROXICET) 5-325 MG tablet Take 1 tablet by mouth every 4 (four) hours as needed. 10/13/15 06/20/19  Yevette Edwards, MD    Family History Family History  Problem Relation Age of Onset  . Diabetes Mother   . Hypertension Mother   . COPD Mother   . COPD Father   . Stroke Father     Social History Social History   Tobacco Use  . Smoking status: Current Every Day Smoker    Packs/day: 0.50  Years: 17.00    Pack years: 8.50    Types: Cigarettes  . Smokeless tobacco: Never Used  Vaping Use  . Vaping Use: Never used  Substance Use Topics  . Alcohol use: No    Alcohol/week: 0.0 standard drinks  . Drug use: No     Allergies   Penicillins, Xarelto [rivaroxaban], and Ibuprofen   Review of Systems Review of Systems  Constitutional: Negative for fever.  Respiratory: Positive for cough and shortness of breath.    Physical Exam Triage Vital Signs ED Triage Vitals  Enc Vitals Group     BP 12/25/19 1123 124/82     Pulse Rate 12/25/19 1123 (!) 110     Resp 12/25/19 1123 (!) 22     Temp 12/25/19 1123 99.4 F (37.4 C)     Temp Source 12/25/19 1123 Oral       SpO2 12/25/19 1123 94 %     Weight 12/25/19 1120 234 lb (106.1 kg)     Height 12/25/19 1120 5\' 5"  (1.651 m)     Head Circumference --      Peak Flow --      Pain Score 12/25/19 1242 0     Pain Loc --      Pain Edu? --      Excl. in GC? --    No data found.  Updated Vital Signs BP 124/82 (BP Location: Left Arm)   Pulse (!) 110   Temp 99.4 F (37.4 C) (Oral)   Resp (!) 22   Ht 5\' 5"  (1.651 m)   Wt 106.1 kg   SpO2 94%   BMI 38.94 kg/m   Visual Acuity Right Eye Distance:   Left Eye Distance:   Bilateral Distance:    Right Eye Near:   Left Eye Near:    Bilateral Near:     Physical Exam Vitals and nursing note reviewed.  Constitutional:      Appearance: She is obese.     Comments: Appears mildly ill.  HENT:     Head: Normocephalic and atraumatic.  Eyes:     General:        Right eye: No discharge.        Left eye: No discharge.     Conjunctiva/sclera: Conjunctivae normal.  Cardiovascular:     Rate and Rhythm: Regular rhythm. Tachycardia present.  Pulmonary:     Effort: No respiratory distress.     Breath sounds: Wheezing present. No rales.  Neurological:     Mental Status: She is alert.  Psychiatric:        Mood and Affect: Mood normal.        Behavior: Behavior normal.    UC Treatments / Results  Labs (all labs ordered are listed, but only abnormal results are displayed) Labs Reviewed  SARS CORONAVIRUS 2 (TAT 6-24 HRS)    EKG   Radiology DG Chest 2 View  Result Date: 12/25/2019 CLINICAL DATA:  Cough EXAM: CHEST - 2 VIEW COMPARISON:  September 02, 2014 chest radiograph and August 25, 2014 chest CT FINDINGS: There is an apparent small right pleural effusion. Lungs elsewhere are clear. Heart size and pulmonary vascularity are normal. No adenopathy. No bone lesions. IMPRESSION: Small right pleural effusion. Lungs elsewhere clear. Heart size normal. No evident adenopathy. Electronically Signed   By: September 04, 2014 III M.D.   On: 12/25/2019 12:22     Procedures Procedures (including critical care time)  Medications Ordered in UC Medications - No data to display  Initial Impression / Assessment and Plan / UC Course  I have reviewed the triage vital signs and the nursing notes.  Pertinent labs & imaging results that were available during my care of the patient were reviewed by me and considered in my medical decision making (see chart for details).    34 year old female presents with bilateral lower rib pain, shortness of breath, cough.  Patient overall ill-appearing.  Wheezing on exam.  Chest x-ray performed and was independently reviewed by me.  She has a small right pleural effusion.  No evidence of pneumonia.  Discussed going to the ER.  Patient elected not to go to the ER at this time.  Will treat with prednisone and albuterol.  Awaiting Covid test results.  Supportive care.  If she worsens, she is to go to the hospital.  Final Clinical Impressions(s) / UC Diagnoses   Final diagnoses:  Pleural effusion  Respiratory infection  Wheezing     Discharge Instructions     Medications as prescribed.  If you worsen, go to the ER.  You need to see your PCP next week for follow up.  Take care  Dr. Adriana Simas    ED Prescriptions    Medication Sig Dispense Auth. Provider   albuterol (VENTOLIN HFA) 108 (90 Base) MCG/ACT inhaler Inhale 1-2 puffs into the lungs every 6 (six) hours as needed for wheezing or shortness of breath. 18 g Krishang Reading G, DO   predniSONE (DELTASONE) 50 MG tablet 1 tablet daily x 5 days 5 tablet Everlene Other G, DO     PDMP not reviewed this encounter.   Tommie Sams, [place] 12/25/19 1327

## 2019-12-25 NOTE — Discharge Instructions (Signed)
Medications as prescribed.  If you worsen, go to the ER.  You need to see your PCP next week for follow up.  Take care  Dr. Adriana Simas

## 2019-12-25 NOTE — ED Triage Notes (Signed)
Patient c/o cough for the past 2-3 days.  Patient reports SOB and pain across her lower rib cage that started 5 days ago.  Patient denies fevers.

## 2019-12-28 ENCOUNTER — Emergency Department
Admission: EM | Admit: 2019-12-28 | Discharge: 2019-12-28 | Disposition: A | Discharge status: Home / Self Care | Payer: Medicaid Other | Attending: Emergency Medicine | Admitting: Emergency Medicine

## 2019-12-28 ENCOUNTER — Emergency Department: Payer: Medicaid Other

## 2019-12-28 ENCOUNTER — Encounter: Payer: Self-pay | Admitting: Emergency Medicine

## 2019-12-28 DIAGNOSIS — R1011 Right upper quadrant pain: Secondary | ICD-10-CM | POA: Diagnosis present

## 2019-12-28 DIAGNOSIS — Z86718 Personal history of other venous thrombosis and embolism: Secondary | ICD-10-CM | POA: Insufficient documentation

## 2019-12-28 DIAGNOSIS — Z7901 Long term (current) use of anticoagulants: Secondary | ICD-10-CM | POA: Diagnosis not present

## 2019-12-28 DIAGNOSIS — Z20822 Contact with and (suspected) exposure to covid-19: Secondary | ICD-10-CM | POA: Diagnosis not present

## 2019-12-28 DIAGNOSIS — Z7984 Long term (current) use of oral hypoglycemic drugs: Secondary | ICD-10-CM | POA: Diagnosis not present

## 2019-12-28 DIAGNOSIS — F17210 Nicotine dependence, cigarettes, uncomplicated: Secondary | ICD-10-CM | POA: Insufficient documentation

## 2019-12-28 DIAGNOSIS — Z86711 Personal history of pulmonary embolism: Secondary | ICD-10-CM | POA: Insufficient documentation

## 2019-12-28 DIAGNOSIS — R0602 Shortness of breath: Secondary | ICD-10-CM | POA: Insufficient documentation

## 2019-12-28 DIAGNOSIS — E119 Type 2 diabetes mellitus without complications: Secondary | ICD-10-CM | POA: Diagnosis not present

## 2019-12-28 LAB — COMPREHENSIVE METABOLIC PANEL
ALT: 17 U/L (ref 0–44)
AST: 21 U/L (ref 15–41)
Albumin: 3.4 g/dL — ABNORMAL LOW (ref 3.5–5.0)
Alkaline Phosphatase: 64 U/L (ref 38–126)
Anion gap: 9 (ref 5–15)
BUN: 10 mg/dL (ref 6–20)
CO2: 27 mmol/L (ref 22–32)
Calcium: 8.7 mg/dL — ABNORMAL LOW (ref 8.9–10.3)
Chloride: 96 mmol/L — ABNORMAL LOW (ref 98–111)
Creatinine, Ser: 0.74 mg/dL (ref 0.44–1.00)
GFR, Estimated: >60 mL/min (ref >60)
Glucose, Bld: 558 mg/dL (ref 70–99)
Potassium: 4 mmol/L (ref 3.5–5.1)
Sodium: 132 mmol/L — ABNORMAL LOW (ref 135–145)
Total Bilirubin: 0.4 mg/dL (ref 0.3–1.2)
Total Protein: 7.4 g/dL (ref 6.5–8.1)

## 2019-12-28 LAB — CBC
HCT: 40.3 % (ref 36.0–46.0)
Hemoglobin: 13 g/dL (ref 12.0–15.0)
MCH: 28 pg (ref 26.0–34.0)
MCHC: 32.3 g/dL (ref 30.0–36.0)
MCV: 86.9 fL (ref 80.0–100.0)
Platelets: 215 10*3/uL (ref 150–400)
RBC: 4.64 MIL/uL (ref 3.87–5.11)
RDW: 14.7 % (ref 11.5–15.5)
WBC: 8.6 10*3/uL (ref 4.0–10.5)
nRBC: 0 % (ref 0.0–0.2)

## 2019-12-28 LAB — RESPIRATORY PANEL BY RT PCR (FLU A&B, COVID)
Influenza A by PCR: NEGATIVE
Influenza B by PCR: NEGATIVE
SARS Coronavirus 2 by RT PCR: NEGATIVE

## 2019-12-28 LAB — PROCALCITONIN: Procalcitonin: <0.1 ng/mL

## 2019-12-28 LAB — TROPONIN I (HIGH SENSITIVITY): Troponin I (High Sensitivity): 4 ng/L (ref <18)

## 2019-12-28 LAB — LIPASE, BLOOD: Lipase: 29 U/L (ref 11–51)

## 2019-12-28 LAB — BRAIN NATRIURETIC PEPTIDE: B Natriuretic Peptide: 83.5 pg/mL (ref 0.0–100.0)

## 2019-12-28 LAB — GLUCOSE, CAPILLARY: Glucose-Capillary: 342 mg/dL — ABNORMAL HIGH (ref 70–99)

## 2019-12-28 MED ORDER — IOHEXOL 350 MG/ML SOLN
100.0000 mL | Freq: Once | INTRAVENOUS | Status: AC | PRN
  Administered 2019-12-28: 100 mL via INTRAVENOUS
  Filled 2019-12-28: qty 100

## 2019-12-28 MED ORDER — MORPHINE SULFATE (PF) 4 MG/ML IV SOLN
4.0000 mg | Freq: Once | INTRAVENOUS | Status: AC
  Administered 2019-12-28: 4 mg via INTRAVENOUS
  Filled 2019-12-28: qty 1

## 2019-12-28 MED ORDER — LACTATED RINGERS IV BOLUS
1000.0000 mL | Freq: Once | INTRAVENOUS | Status: AC
  Administered 2019-12-28: 1000 mL via INTRAVENOUS

## 2019-12-28 MED ORDER — INSULIN ASPART 100 UNIT/ML ~~LOC~~ SOLN
5.0000 [IU] | Freq: Once | SUBCUTANEOUS | Status: AC
  Administered 2019-12-28: 5 [IU] via SUBCUTANEOUS
  Filled 2019-12-28: qty 1

## 2019-12-28 NOTE — ED Triage Notes (Signed)
Pt reports was seen at UC this weekend and told she had fluid on her lungs and if she didn't get better to come to the ED. Pt states still some pain to her right ribs and right upper abd, a little SOB and a cough

## 2019-12-28 NOTE — ED Notes (Signed)
Pt requesting pain meds for abdominal pain, Dr Larinda Buttery notified. Prn pain meds administered. [place] at bedside

## 2019-12-28 NOTE — ED Notes (Signed)
Pt transported to CT ?

## 2019-12-28 NOTE — ED Provider Notes (Signed)
[facility] Emergency Department Provider Note   ____________________________________________   First MD Initiated Contact with Patient 12/28/19 1414     (approximate)  I have reviewed the triage vital signs and the nursing notes.   HISTORY  Chief Complaint Shortness of Breath, Abdominal Pain, and Back Pain    HPI [name] is a 34 y.o. female with past medical history of diabetes and DVT/PE on Eliquis who presents to the ED complaining of shortness of breath and chest pain.  Patient reports that for about the past 5 days she has been dealing with discomfort in her right lower chest and right upper quadrant of her abdomen whenever she goes to cough.  She has felt slightly more out of breath than usual, but states her cough is nonproductive and she has not had any fevers.  She has had occasional nausea but no vomiting, and denies any changes in her bowel movements.  She denies any recent fevers or sick contacts.  She was seen for the symptoms at urgent care 4 days ago, when she was told she had fluid on the right side of her lungs, subsequently prescribed an inhaler and steroids.  She states her symptoms have been unchanged since then.  She tested negative for COVID-19 at the time of her urgent care visit.  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        Past Medical History:  Diagnosis Date  . Collagen vascular disease (HCC)    HX: RT blood clot in leg, PE  . Diabetes mellitus without complication (HCC)   . Pulmonary embolism [facility])     Patient Active Problem List   Diagnosis Date Noted  . 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 DDD (degenerative disc disease), lumbar 04/21/2015  . Obesity, Class II, BMI 35-39.9 04/21/2015  . Anxiety, generalized 02/04/2015  . Hx pulmonary embolism 08/16/2014  . Acid reflux 03/25/2014  . Current tobacco use 03/25/2014  . H/O deep venous thrombosis  12/01/2013    Past Surgical History:  Procedure Laterality Date  . ABDOMINAL HYSTERECTOMY    . IVC filter    . tubal ligaton      Prior to Admission medications   Medication Sig Start Date End Date Taking? Authorizing Provider  albuterol (VENTOLIN HFA) 108 (90 Base) MCG/ACT inhaler Inhale 1-2 puffs into the lungs every 6 (six) hours as needed for wheezing or shortness of breath. 12/25/19   Tommie Sams, DO  apixaban (ELIQUIS) 5 MG TABS tablet Take 5 mg by mouth 2 (two) times daily.    [provider]  Buprenorphine HCl-Naloxone HCl (SUBOXONE) 8-2 MG FILM Place under the tongue.    [provider]  metFORMIN (GLUCOPHAGE) 500 MG tablet Take by mouth daily with breakfast.    [provider]  mupirocin ointment (BACTROBAN) 2 % Apply 1 application topically 2 (two) times daily. 06/20/19   Domenick Gong, MD  nystatin ointment (MYCOSTATIN) Apply 1 application topically 2 (two) times daily. Until symptoms resolve 06/20/19   Domenick Gong, MD  omeprazole (PRILOSEC) 40 MG capsule Take 1 capsule (40 mg total) by mouth daily. 04/21/15   Plonk, Chrissie Noa, MD  predniSONE (DELTASONE) 50 MG tablet 1 tablet daily x 5 days 12/25/19   Tommie Sams, DO  clonazePAM (KLONOPIN) 0.5 MG tablet Take 1 mg by mouth 2 (two) times daily.   06/20/19  [provider]  oxyCODONE-acetaminophen (PERCOCET/ROXICET) 5-325  MG tablet Take 1 tablet by mouth every 4 (four) hours as needed. 10/13/15 06/20/19  Yevette Edwards, MD    Allergies Penicillins, Xarelto [rivaroxaban], and Ibuprofen  Family History  Problem Relation Age of Onset  . Diabetes Mother   . Hypertension Mother   . COPD Mother   . COPD Father   . Stroke Father     Social History Social History   Tobacco Use  . Smoking status: Current Every Day Smoker    Packs/day: 0.50    Years: 17.00    Pack years: 8.50    Types: Cigarettes  . Smokeless tobacco: Never Used  Vaping Use  . Vaping Use: Never used  Substance Use  Topics  . Alcohol use: No    Alcohol/week: 0.0 standard drinks  . Drug use: No    Review of Systems  Constitutional: No fever/chills Eyes: No visual changes. ENT: No sore throat. Cardiovascular: Positive for chest pain. Respiratory: Positive for cough and shortness of breath. Gastrointestinal: Positive for abdominal pain.  Positive for nausea, no vomiting.  No diarrhea.  No constipation. Genitourinary: Negative for dysuria. Musculoskeletal: Negative for back pain. Skin: Negative for rash. Neurological: Negative for headaches, focal weakness or numbness.  ____________________________________________   PHYSICAL EXAM:  VITAL SIGNS: ED Triage Vitals  Enc Vitals Group     BP 12/28/19 0901 (!) 123/57     Pulse Rate 12/28/19 0901 98     Resp 12/28/19 0901 20     Temp 12/28/19 0901 98.5 F (36.9 C)     Temp Source 12/28/19 0901 Oral     SpO2 12/28/19 0901 93 %     Weight 12/28/19 0852 234 lb (106.1 kg)     Height 12/28/19 0852 5\' 5"  (1.651 m)     Head Circumference --      Peak Flow --      Pain Score 12/28/19 1111 8     Pain Loc --      Pain Edu? --      Excl. in GC? --     Constitutional: Alert and oriented. Eyes: Conjunctivae are normal. Head: Atraumatic. Nose: No congestion/rhinnorhea. Mouth/Throat: Mucous membranes are moist. Neck: Normal ROM Cardiovascular: Normal rate, regular rhythm. Grossly normal heart sounds. Respiratory: Normal respiratory effort.  No retractions. Lungs with faint end expiratory wheezing Gastrointestinal: Soft and tender to palpation in the right upper quadrant. No distention. Genitourinary: deferred Musculoskeletal: No lower extremity tenderness nor edema. Neurologic:  Normal speech and language. No gross focal neurologic deficits are appreciated. Skin:  Skin is warm, dry and intact. No rash noted. Psychiatric: Mood and affect are normal. Speech and behavior are normal.  ____________________________________________   LABS (all labs  ordered are listed, but only abnormal results are displayed)  Labs Reviewed  COMPREHENSIVE METABOLIC PANEL - Abnormal; Notable for the following components:      Result Value   Sodium 132 (*)    Chloride 96 (*)    Glucose, Bld 558 (*)    Calcium 8.7 (*)    Albumin 3.4 (*)    All other components within normal limits  GLUCOSE, CAPILLARY - Abnormal; Notable for the following components:   Glucose-Capillary 342 (*)    All other components within normal limits  RESPIRATORY PANEL BY RT PCR (FLU A&B, COVID)  CBC  PROCALCITONIN  BRAIN NATRIURETIC PEPTIDE  LIPASE, BLOOD  TROPONIN I (HIGH SENSITIVITY)   ____________________________________________  EKG  ED ECG REPORT I, 02/27/20, the attending physician, personally viewed and interpreted  this ECG.   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  She states she has been slightly more short of breath than usual, but is not in any respiratory distress on my evaluation, maintaining O2 sats on room air.  I would have a high suspicion for PE given her pleuritic symptoms and admission that she has missed doses of Eliquis.  We will further assess with CTA and also scan through her abdomen to rule out biliary disease.  Lab work thus far is reassuring, chest x-ray appears similar to previous with small right-sided effusion.  Patient noted to be hyperglycemic, likely secondary to recent steroids.  No evidence of DKA with normal anion  gap and no acidosis.  CTA is negative for PE or other acute process, no evidence of significant pleural effusion but she does have scarring likely related to prior PE.  No evidence of pneumonia and gallbladder is unremarkable, right upper quadrant ultrasound also performed and shows no evidence of gallstones.  Patient reports pain is improved, hyperglycemia is also improving following IV fluids.  She was given an additional dose of insulin for hyperglycemia, advised to closely monitor her glucose levels while taking course of steroids.  She is appropriate for discharge home with PCP follow-up, patient agrees with plan.      ____________________________________________   FINAL CLINICAL IMPRESSION(S) / ED DIAGNOSES  Final diagnoses:  RUQ pain  Shortness of breath     ED Discharge Orders    None       Note:  This document was prepared using Dragon voice recognition software and may include unintentional dictation errors.   Chesley Noon, MD 12/28/19 [phone]

## 2020-01-16 ENCOUNTER — Other Ambulatory Visit: Payer: Self-pay | Admitting: Family Medicine

## 2020-05-23 ENCOUNTER — Other Ambulatory Visit: Payer: Self-pay

## 2020-05-23 ENCOUNTER — Encounter: Payer: Self-pay | Admitting: Radiology

## 2020-05-23 ENCOUNTER — Emergency Department: Payer: Medicaid Other

## 2020-05-23 ENCOUNTER — Emergency Department
Admission: EM | Admit: 2020-05-23 | Discharge: 2020-05-23 | Disposition: A | Discharge status: Home / Self Care | Payer: Medicaid Other | Attending: Student in an Organized Health Care Education/Training Program | Admitting: Student in an Organized Health Care Education/Training Program

## 2020-05-23 DIAGNOSIS — K529 Noninfective gastroenteritis and colitis, unspecified: Secondary | ICD-10-CM | POA: Diagnosis not present

## 2020-05-23 DIAGNOSIS — R1084 Generalized abdominal pain: Secondary | ICD-10-CM

## 2020-05-23 DIAGNOSIS — R1030 Lower abdominal pain, unspecified: Secondary | ICD-10-CM | POA: Diagnosis present

## 2020-05-23 DIAGNOSIS — E119 Type 2 diabetes mellitus without complications: Secondary | ICD-10-CM | POA: Insufficient documentation

## 2020-05-23 DIAGNOSIS — F17210 Nicotine dependence, cigarettes, uncomplicated: Secondary | ICD-10-CM | POA: Diagnosis not present

## 2020-05-23 DIAGNOSIS — Z7901 Long term (current) use of anticoagulants: Secondary | ICD-10-CM | POA: Diagnosis not present

## 2020-05-23 DIAGNOSIS — Z7984 Long term (current) use of oral hypoglycemic drugs: Secondary | ICD-10-CM | POA: Insufficient documentation

## 2020-05-23 LAB — CBC
HCT: 45.2 % (ref 36.0–46.0)
Hemoglobin: 14.5 g/dL (ref 12.0–15.0)
MCH: 27.7 pg (ref 26.0–34.0)
MCHC: 32.1 g/dL (ref 30.0–36.0)
MCV: 86.4 fL (ref 80.0–100.0)
Platelets: 212 10*3/uL (ref 150–400)
RBC: 5.23 MIL/uL — ABNORMAL HIGH (ref 3.87–5.11)
RDW: 13.5 % (ref 11.5–15.5)
WBC: 6.8 10*3/uL (ref 4.0–10.5)
nRBC: 0 % (ref 0.0–0.2)

## 2020-05-23 LAB — COMPREHENSIVE METABOLIC PANEL
ALT: 16 U/L (ref 0–44)
AST: 15 U/L (ref 15–41)
Albumin: 3.7 g/dL (ref 3.5–5.0)
Alkaline Phosphatase: 73 U/L (ref 38–126)
Anion gap: 9 (ref 5–15)
BUN: 7 mg/dL (ref 6–20)
CO2: 24 mmol/L (ref 22–32)
Calcium: 8.8 mg/dL — ABNORMAL LOW (ref 8.9–10.3)
Chloride: 103 mmol/L (ref 98–111)
Creatinine, Ser: 0.59 mg/dL (ref 0.44–1.00)
GFR, Estimated: >60 mL/min (ref >60)
Glucose, Bld: 251 mg/dL — ABNORMAL HIGH (ref 70–99)
Potassium: 3.7 mmol/L (ref 3.5–5.1)
Sodium: 136 mmol/L (ref 135–145)
Total Bilirubin: 0.7 mg/dL (ref 0.3–1.2)
Total Protein: 7.5 g/dL (ref 6.5–8.1)

## 2020-05-23 LAB — URINALYSIS, COMPLETE (UACMP) WITH MICROSCOPIC
Bacteria, UA: NONE SEEN
Bilirubin Urine: NEGATIVE
Glucose, UA: 50 mg/dL — AB
Hgb urine dipstick: NEGATIVE
Ketones, ur: NEGATIVE mg/dL
Leukocytes,Ua: NEGATIVE
Nitrite: NEGATIVE
Protein, ur: NEGATIVE mg/dL
Specific Gravity, Urine: >1.046 — ABNORMAL HIGH (ref 1.005–1.030)
pH: 8 (ref 5.0–8.0)

## 2020-05-23 LAB — LIPASE, BLOOD: Lipase: 30 U/L (ref 11–51)

## 2020-05-23 MED ORDER — IOHEXOL 300 MG/ML  SOLN
100.0000 mL | Freq: Once | INTRAMUSCULAR | Status: AC | PRN
  Administered 2020-05-23: 100 mL via INTRAVENOUS
  Filled 2020-05-23: qty 100

## 2020-05-23 MED ORDER — METRONIDAZOLE 500 MG PO TABS: 500.0000 mg | ORAL_TABLET | Freq: Three times a day (TID) | ORAL | 0 refills | Status: AC

## 2020-05-23 MED ORDER — SODIUM CHLORIDE 0.9 % IV BOLUS
1000.0000 mL | Freq: Once | INTRAVENOUS | Status: AC
  Administered 2020-05-23: 1000 mL via INTRAVENOUS

## 2020-05-23 MED ORDER — MORPHINE SULFATE (PF) 4 MG/ML IV SOLN
4.0000 mg | INTRAVENOUS | Status: DC | PRN
  Administered 2020-05-23: 4 mg via INTRAVENOUS
  Filled 2020-05-23: qty 1

## 2020-05-23 MED ORDER — DICYCLOMINE HCL 10 MG PO CAPS: 10.0000 mg | ORAL_CAPSULE | Freq: Three times a day (TID) | ORAL | 0 refills | Status: DC | PRN

## 2020-05-23 MED ORDER — ONDANSETRON HCL 4 MG/2ML IJ SOLN
4.0000 mg | Freq: Once | INTRAMUSCULAR | Status: AC
  Administered 2020-05-23: 4 mg via INTRAVENOUS
  Filled 2020-05-23: qty 2

## 2020-05-23 MED ORDER — ONDANSETRON HCL 4 MG PO TABS: 4.0000 mg | ORAL_TABLET | Freq: Every day | ORAL | 0 refills | Status: DC | PRN

## 2020-05-23 NOTE — ED Triage Notes (Signed)
Pt via POV from home. Pt c/o generalized lower abdominal pain for the past 4 days. Pt also states she has been having diarrhea. Denies NV. Denies fever. Denies urinary symptoms. Pt is A&Ox4 and NAD.

## 2020-05-23 NOTE — Discharge Instructions (Signed)

## 2020-05-23 NOTE — ED Provider Notes (Signed)
[facility] Emergency Department Provider Note    Event Date/Time   First MD Initiated Contact with Patient 05/23/20 1333     (approximate)  I have reviewed the triage vital signs and the nursing notes.   HISTORY  Chief Complaint Abdominal Pain    HPI Ludmilla L Towner is a 35 y.o. female the below listed past medical history presents to the ER for evaluation of  lower abdominal pain as well as diarrhea and nausea for the past 4 days.  No recent antibiotics.  No history of C. difficile.  No measured fevers.  Describes it as crampy in nature.  No known sick contacts.  No undercooked food or known exposures.  No known family history of IBD.  Has noted a few streaks of blood.  She is on anticoagulation for history of PE.  No melena.   Past Medical History:  Diagnosis Date  . Collagen vascular disease (HCC)    HX: RT blood clot in leg, PE  . Diabetes mellitus without complication (HCC)   . Pulmonary embolism (HCC)    Family History  Problem Relation Age of Onset  . Diabetes Mother   . Hypertension Mother   . COPD Mother   . COPD Father   . Stroke Father    Past Surgical History:  Procedure Laterality Date  . ABDOMINAL HYSTERECTOMY    . IVC filter    . tubal ligaton     Patient Active Problem List   Diagnosis Date Noted  . 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 DDD (degenerative disc disease), lumbar 04/21/2015  . Obesity, Class II, BMI 35-39.9 04/21/2015  . Anxiety, generalized 02/04/2015  . Hx pulmonary embolism 08/16/2014  . Acid reflux 03/25/2014  . Current tobacco use 03/25/2014  . H/O deep venous thrombosis 12/01/2013      Prior to Admission medications   Medication Sig Start Date End Date Taking? Authorizing Provider  dicyclomine (BENTYL) 10 MG capsule Take 1 capsule (10 mg total) by mouth 3 (three) times daily as needed for up to 14 days for spasms. 05/23/20 06/06/20 Yes Willy Eddy, MD  metroNIDAZOLE (FLAGYL) 500 MG tablet Take 1  tablet (500 mg total) by mouth 3 (three) times daily for 7 days. 05/23/20 05/30/20 Yes Willy Eddy, MD  ondansetron (ZOFRAN) 4 MG tablet Take 1 tablet (4 mg total) by mouth daily as needed. 05/23/20 05/23/21 Yes Willy Eddy, MD  albuterol (VENTOLIN HFA) 108 (90 Base) MCG/ACT inhaler Inhale 1-2 puffs into the lungs every 6 (six) hours as needed for wheezing or shortness of breath. 12/25/19   Tommie Sams, DO  apixaban (ELIQUIS) 5 MG TABS tablet Take 5 mg by mouth 2 (two) times daily.    [provider]  Buprenorphine HCl-Naloxone HCl (SUBOXONE) 8-2 MG FILM Place under the tongue.    [provider]  metFORMIN (GLUCOPHAGE) 500 MG tablet Take by mouth daily with breakfast.    [provider]  mupirocin ointment (BACTROBAN) 2 % Apply 1 application topically 2 (two) times daily. 06/20/19   Domenick Gong, MD  nystatin ointment (MYCOSTATIN) Apply 1 application topically 2 (two) times daily. Until symptoms resolve 06/20/19   Domenick Gong, MD  omeprazole (PRILOSEC) 40 MG capsule Take 1 capsule (40 mg total) by mouth daily. 04/21/15   Plonk, Chrissie Noa, MD  predniSONE (DELTASONE) 50 MG tablet 1 tablet daily x 5 days 12/25/19   Tommie Sams, DO  clonazePAM (KLONOPIN) 0.5 MG tablet Take 1 mg by mouth 2 (two)  times daily.   06/20/19  [provider]  oxyCODONE-acetaminophen (PERCOCET/ROXICET) 5-325 MG tablet Take 1 tablet by mouth every 4 (four) hours as needed. 10/13/15 06/20/19  Yevette Edwards, MD    Allergies Penicillins, Xarelto [rivaroxaban], and Ibuprofen    Social History Social History   Tobacco Use  . Smoking status: Current Every Day Smoker    Packs/day: 0.50    Years: 17.00    Pack years: 8.50    Types: Cigarettes  . Smokeless tobacco: Never Used  Vaping Use  . Vaping Use: Never used  Substance Use Topics  . Alcohol use: No    Alcohol/week: 0.0 standard drinks  . Drug use: No    Review of Systems Patient denies headaches, rhinorrhea, blurry  vision, numbness, shortness of breath, chest pain, edema, cough, abdominal pain, nausea, vomiting, diarrhea, dysuria, fevers, rashes or hallucinations unless otherwise stated above in HPI. ____________________________________________   PHYSICAL EXAM:  VITAL SIGNS: Vitals:   05/23/20 1223  BP: 121/84  Pulse: 99  Resp: 20  Temp: 97.9 F (36.6 C)  SpO2: 99%    Constitutional: Alert and oriented.  Eyes: Conjunctivae are normal.  Head: Atraumatic. Nose: No congestion/rhinnorhea. Mouth/Throat: Mucous membranes are moist.   Neck: No stridor. Painless ROM.  Cardiovascular: Normal rate, regular rhythm. Grossly normal heart sounds.  Good peripheral circulation. Respiratory: Normal respiratory effort.  No retractions. Lungs CTAB. Gastrointestinal: Soft and nontender in all four quadrants. No distention. No abdominal bruits. No CVA tenderness. Genitourinary:  Musculoskeletal: No lower extremity tenderness nor edema.  No joint effusions. Neurologic:  Normal speech and language. No gross focal neurologic deficits are appreciated. No facial droop Skin:  Skin is warm, dry and intact. No rash noted. Psychiatric: Mood and affect are normal. Speech and behavior are normal.  ____________________________________________   LABS (all labs ordered are listed, but only abnormal results are displayed)  Results for orders placed or performed during the hospital encounter of 05/23/20 (from the past 24 hour(s))  Lipase, blood     Status: None   Collection Time: 05/23/20 12:26 PM  Result Value Ref Range   Lipase 30 11 - 51 U/L  Comprehensive metabolic panel     Status: Abnormal   Collection Time: 05/23/20 12:26 PM  Result Value Ref Range   Sodium 136 135 - 145 mmol/L   Potassium 3.7 3.5 - 5.1 mmol/L   Chloride 103 98 - 111 mmol/L   CO2 24 22 - 32 mmol/L   Glucose, Bld 251 (H) 70 - 99 mg/dL   BUN 7 6 - 20 mg/dL   Creatinine, Ser 6.28 0.44 - 1.00 mg/dL   Calcium 8.8 (L) 8.9 - 10.3 mg/dL   Total  Protein 7.5 6.5 - 8.1 g/dL   Albumin 3.7 3.5 - 5.0 g/dL   AST 15 15 - 41 U/L   ALT 16 0 - 44 U/L   Alkaline Phosphatase 73 38 - 126 U/L   Total Bilirubin 0.7 0.3 - 1.2 mg/dL   GFR, Estimated >31 >51 mL/min   Anion gap 9 5 - 15  CBC     Status: Abnormal   Collection Time: 05/23/20 12:26 PM  Result Value Ref Range   WBC 6.8 4.0 - 10.5 K/uL   RBC 5.23 (H) 3.87 - 5.11 MIL/uL   Hemoglobin 14.5 12.0 - 15.0 g/dL   HCT 76.1 60.7 - 37.1 %   MCV 86.4 80.0 - 100.0 fL   MCH 27.7 26.0 - 34.0 pg   MCHC 32.1 30.0 -  36.0 g/dL   RDW 70.6 23.7 - 62.8 %   Platelets 212 150 - 400 K/uL   nRBC 0.0 0.0 - 0.2 %   ____________________________________________ ____________________________________________  RADIOLOGY  I personally reviewed all radiographic images ordered to evaluate for the above acute complaints and reviewed radiology reports and findings.  These findings were personally discussed with the patient.  Please see medical record for radiology report.  ____________________________________________   PROCEDURES  Procedure(s) performed:  Procedures    Critical Care performed: no ____________________________________________   INITIAL IMPRESSION / ASSESSMENT AND PLAN / ED COURSE  Pertinent labs & imaging results that were available during my care of the patient were reviewed by me and considered in my medical decision making (see chart for details).   DDX: Colitis, diverticulitis, IBD, IBS, enteritis, electrolyte abnormality  Marieann L Weihe is a 35 y.o. who presents to the ED with presentation as described above.  Patient afebrile hemodynamically stable with reassuring blood work but does have some mild discomfort and describing symptoms as colicky in nature.  She also has fairly significant comorbidities based on her age therefore CT imaging was ordered for the above differential.  CT imaging shows colitis.  Recommended the patient provide stool sample but patient states that she is unable  to at this time which is somewhat reassuring and suggests against C. difficile.  As IBD is on the differential we will put on Flagyl given her diarrheal illness as well as symptomatic treatment and give referral to GI.  We discussed strict return precautions.  Patient agreeable to plan     The patient was evaluated in Emergency Department today for the symptoms described in the history of present illness. He/she was evaluated in the context of the global COVID-19 pandemic, which necessitated consideration that the patient might be at risk for infection with the SARS-CoV-2 virus that causes COVID-19. Institutional protocols and algorithms that pertain to the evaluation of patients at risk for COVID-19 are in a state of rapid change based on information released by regulatory bodies including the CDC and federal and state organizations. These policies and algorithms were followed during the patient's care in the ED.  As part of my medical decision making, I reviewed the following data within the electronic MEDICAL RECORD NUMBER Nursing notes reviewed and incorporated, Labs reviewed, notes from prior ED visits and  Controlled Substance Database   ____________________________________________   FINAL CLINICAL IMPRESSION(S) / ED DIAGNOSES  Final diagnoses:  Generalized abdominal pain  Colitis      NEW MEDICATIONS STARTED DURING THIS VISIT:  New Prescriptions   DICYCLOMINE (BENTYL) 10 MG CAPSULE    Take 1 capsule (10 mg total) by mouth 3 (three) times daily as needed for up to 14 days for spasms.   METRONIDAZOLE (FLAGYL) 500 MG TABLET    Take 1 tablet (500 mg total) by mouth 3 (three) times daily for 7 days.   ONDANSETRON (ZOFRAN) 4 MG TABLET    Take 1 tablet (4 mg total) by mouth daily as needed.     Note:  This document was prepared using Dragon voice recognition software and may include unintentional dictation errors.    Willy Eddy, MD 05/23/20 [phone]

## 2020-07-21 ENCOUNTER — Other Ambulatory Visit: Payer: Self-pay

## 2020-07-22 ENCOUNTER — Ambulatory Visit: Payer: Medicaid Other | Admitting: Gastroenterology

## 2020-08-10 ENCOUNTER — Other Ambulatory Visit: Payer: Self-pay

## 2020-08-10 ENCOUNTER — Ambulatory Visit
Admission: EM | Admit: 2020-08-10 | Discharge: 2020-08-10 | Disposition: A | Discharge status: Home / Self Care | Payer: Medicaid Other | Attending: Sports Medicine | Admitting: Sports Medicine

## 2020-08-10 DIAGNOSIS — R300 Dysuria: Secondary | ICD-10-CM | POA: Insufficient documentation

## 2020-08-10 DIAGNOSIS — N3001 Acute cystitis with hematuria: Secondary | ICD-10-CM | POA: Diagnosis present

## 2020-08-10 DIAGNOSIS — R350 Frequency of micturition: Secondary | ICD-10-CM | POA: Insufficient documentation

## 2020-08-10 DIAGNOSIS — R109 Unspecified abdominal pain: Secondary | ICD-10-CM | POA: Diagnosis present

## 2020-08-10 DIAGNOSIS — R3915 Urgency of urination: Secondary | ICD-10-CM | POA: Insufficient documentation

## 2020-08-10 DIAGNOSIS — R102 Pelvic and perineal pain: Secondary | ICD-10-CM | POA: Diagnosis present

## 2020-08-10 LAB — URINALYSIS, COMPLETE (UACMP) WITH MICROSCOPIC
Glucose, UA: >1000 mg/dL — AB
Nitrite: NEGATIVE
Protein, ur: 100 mg/dL — AB
RBC / HPF: >50 RBC/hpf (ref 0–5)
Specific Gravity, Urine: >1.03 — ABNORMAL HIGH (ref 1.005–1.030)
WBC, UA: >50 WBC/hpf (ref 0–5)
pH: 5.5 (ref 5.0–8.0)

## 2020-08-10 MED ORDER — NITROFURANTOIN MONOHYD MACRO 100 MG PO CAPS: 100.0000 mg | ORAL_CAPSULE | Freq: Two times a day (BID) | ORAL | 0 refills | Status: DC

## 2020-08-10 MED ORDER — PHENAZOPYRIDINE HCL 200 MG PO TABS: 200.0000 mg | ORAL_TABLET | Freq: Three times a day (TID) | ORAL | 0 refills | Status: DC

## 2020-08-10 NOTE — ED Provider Notes (Signed)
[facility]    CSN: [id number] Arrival date & time: 08/10/20  0917      History   Chief Complaint Chief Complaint  Patient presents with  . Dysuria    HPI [name] is a 34 y.o. female.   35 year old female who presents for evaluation of the above issues.  Her primary care needs are met at [facility] here in [place].  She did not try to call them or make an appointment.  She does not work outside the home.  She is on disability due to blood clots.  Patient presents today with suprapubic abdominal pain, right-sided flank pain, right low back pain.  She is also reporting hematuria, dysuria, increased urinary frequency, increased urinary urgency, and incomplete voiding.  No fever shakes chills.  No nausea vomiting or diarrhea.  She has had her symptoms now for a few days.  She claims to be drinking a lot of water.  She says she has had kidney stones in the past on 2 other occasions.  She has had UTIs as well.  Only one abdominal surgery and she had a hysterectomy.  She has 4 children all vaginal deliveries.  No history of any GI issues.  No blood in the stool.  No chest pain shortness of breath.  No red flag signs or symptoms elicited on history.     Past Medical History:  Diagnosis Date  . Collagen vascular disease (HCC)    HX: RT blood clot in leg, PE  . Diabetes mellitus without complication ([place])   . Pulmonary embolism [facility])     Patient Active Problem List   Diagnosis Date Noted  . 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 DDD (degenerative disc disease), lumbar 04/21/2015  . Obesity, Class II, BMI 35-39.9 04/21/2015  . Anxiety, generalized 02/04/2015  . Hx pulmonary embolism 08/16/2014  . Acid reflux 03/25/2014  . Current tobacco use 03/25/2014  . H/O deep venous thrombosis 12/01/2013    Past Surgical History:  Procedure Laterality Date  . ABDOMINAL HYSTERECTOMY    . IVC filter    . tubal ligaton      OB History   No obstetric history on file.      Home  Medications    Prior to Admission medications   Medication Sig Start Date End Date Taking? Authorizing Provider  gabapentin (NEURONTIN) 300 MG capsule Take 1 capsule by mouth daily. 05/12/20  Yes [provider]  metFORMIN (GLUCOPHAGE) 500 MG tablet Take by mouth daily with breakfast.   Yes [provider]  nitrofurantoin, macrocrystal-monohydrate, (MACROBID) 100 MG capsule Take 1 capsule (100 mg total) by mouth 2 (two) times daily. 08/10/20  Yes Verda Cumins, MD  omeprazole (PRILOSEC) 40 MG capsule Take 1 capsule (40 mg total) by mouth daily. 04/21/15  Yes Plonk, Gwyndolyn Saxon, MD  phenazopyridine (PYRIDIUM) 200 MG tablet Take 1 tablet (200 mg total) by mouth 3 (three) times daily. 08/10/20  Yes Verda Cumins, MD  albuterol (VENTOLIN HFA) 108 (90 Base) MCG/ACT inhaler Inhale 1-2 puffs into the lungs every 6 (six) hours as needed for wheezing or shortness of breath. 12/25/19   Coral Spikes, DO  apixaban (ELIQUIS) 5 MG TABS tablet Take 5 mg by mouth 2 (two) times daily.    [provider]  Buprenorphine HCl-Naloxone HCl 8-2 MG FILM Place under the tongue.    [provider]  dicyclomine (BENTYL) 10 MG capsule Take 1 capsule (10 mg total) by mouth 3 (three) times daily as needed for  up to 14 days for spasms. 05/23/20 06/06/20  Merlyn Lot, MD  mupirocin ointment (BACTROBAN) 2 % Apply 1 application topically 2 (two) times daily. 06/20/19   Melynda Ripple, MD  nystatin ointment (MYCOSTATIN) Apply 1 application topically 2 (two) times daily. Until symptoms resolve 06/20/19   Melynda Ripple, MD  ondansetron (ZOFRAN) 4 MG tablet Take 1 tablet (4 mg total) by mouth daily as needed. 05/23/20 05/23/21  Merlyn Lot, MD  predniSONE (DELTASONE) 50 MG tablet 1 tablet daily x 5 days 12/25/19   Coral Spikes, DO  clonazePAM (KLONOPIN) 0.5 MG tablet Take 1 mg by mouth 2 (two) times daily.   06/20/19  [provider]  oxyCODONE-acetaminophen (PERCOCET/ROXICET) 5-325 MG  tablet Take 1 tablet by mouth every 4 (four) hours as needed. 10/13/15 06/20/19  Molli Barrows, MD    Family History Family History  Problem Relation Age of Onset  . Diabetes Mother   . Hypertension Mother   . COPD Mother   . COPD Father   . Stroke Father     Social History Social History   Tobacco Use  . Smoking status: Current Every Day Smoker    Packs/day: 0.50    Years: 17.00    Pack years: 8.50    Types: Cigarettes  . Smokeless tobacco: Never Used  Vaping Use  . Vaping Use: Never used  Substance Use Topics  . Alcohol use: No    Alcohol/week: 0.0 standard drinks  . Drug use: No     Allergies   Penicillins, Xarelto [rivaroxaban], and Ibuprofen   Review of Systems Review of Systems  Constitutional: Negative for appetite change, chills, diaphoresis, fatigue and fever.  HENT: Negative for congestion, ear pain, postnasal drip, rhinorrhea, sinus pressure, sinus pain, sneezing and sore throat.   Eyes: Negative for pain.  Respiratory: Negative for cough, chest tightness and shortness of breath.   Cardiovascular: Negative for chest pain and palpitations.  Gastrointestinal: Positive for abdominal pain. Negative for blood in stool, diarrhea, nausea and vomiting.  Genitourinary: Positive for dysuria, flank pain, frequency, hematuria and urgency. Negative for vaginal bleeding, vaginal discharge and vaginal pain.  Musculoskeletal: Negative for back pain, myalgias and neck pain.  Skin: Negative for color change, pallor, rash and wound.  Neurological: Negative for dizziness, light-headedness and headaches.  All other systems reviewed and are negative.    Physical Exam Triage Vital Signs ED Triage Vitals  Enc Vitals Group     BP 08/10/20 1004 119/60     Pulse Rate 08/10/20 1004 (!) 105     Resp 08/10/20 1004 18     Temp 08/10/20 1004 98.3 F (36.8 C)     Temp Source 08/10/20 1004 Oral     SpO2 08/10/20 1004 98 %     Weight 08/10/20 1001 230 lb (104.3 kg)     Height  08/10/20 1001 _0  (1.651 m)     Head Circumference --      Peak Flow --      Pain Score 08/10/20 1001 7     Pain Loc --      Pain Edu? --      Excl. in [place]? --    No data found.  Updated Vital Signs BP 119/60 (BP Location: Right Arm)   Pulse (!) 105   Temp 98.3 F (36.8 C) (Oral)   Resp 18   Ht _1  (1.651 m)   Wt 104.3 kg   SpO2 98%   BMI 38.27 kg/m  Visual Acuity Right Eye Distance:   Left Eye Distance:   Bilateral Distance:    Right Eye Near:   Left Eye Near:    Bilateral Near:     Physical Exam Vitals and nursing note reviewed.  Constitutional:      General: She is not in acute distress.    Appearance: Normal appearance. She is not ill-appearing, toxic-appearing or diaphoretic.  HENT:     Head: Normocephalic and atraumatic.     Nose: No congestion or rhinorrhea.     Mouth/Throat:     Mouth: Mucous membranes are moist.  Eyes:     General: No scleral icterus.       Right eye: No discharge.        Left eye: No discharge.     Extraocular Movements: Extraocular movements intact.     Conjunctiva/sclera: Conjunctivae normal.     Pupils: Pupils are equal, round, and reactive to light.  Cardiovascular:     Rate and Rhythm: Normal rate and regular rhythm.     Pulses: Normal pulses.     Heart sounds: Normal heart sounds. No murmur heard. No friction rub. No gallop.   Pulmonary:     Effort: Pulmonary effort is normal.     Breath sounds: Normal breath sounds. No stridor. No wheezing, rhonchi or rales.  Abdominal:     General: Bowel sounds are normal. There is no distension.     Palpations: Abdomen is soft. There is no shifting dullness, fluid wave, hepatomegaly or splenomegaly.     Tenderness: There is abdominal tenderness in the suprapubic area. There is right CVA tenderness. There is no left CVA tenderness, guarding or rebound. Negative signs include Murphy's sign, Rovsing's sign, McBurney's sign, psoas sign and obturator sign.  Musculoskeletal:     Cervical  back: Normal range of motion and neck supple.  Skin:    General: Skin is warm and dry.     Capillary Refill: Capillary refill takes less than 2 seconds.     Findings: No bruising, erythema, lesion or rash.  Neurological:     General: No focal deficit present.     Mental Status: She is alert and oriented to person, place, and time.      UC Treatments / Results  Labs (all labs ordered are listed, but only abnormal results are displayed) Labs Reviewed  URINALYSIS, COMPLETE (UACMP) WITH MICROSCOPIC - Abnormal; Notable for the following components:      Result Value   Color, Urine AMBER (*)    APPearance CLOUDY (*)    Specific Gravity, Urine >1.030 (*)    Glucose, UA >1,000 (*)    Hgb urine dipstick LARGE (*)    Bilirubin Urine SMALL (*)    Ketones, ur TRACE (*)    Protein, ur 100 (*)    Leukocytes,Ua TRACE (*)    Bacteria, UA FEW (*)    All other components within normal limits  URINE CULTURE    EKG   Radiology No results found.  Procedures Procedures (including critical care time)  Medications Ordered in UC Medications - No data to display  Initial Impression / Assessment and Plan / UC Course  I have reviewed the triage vital signs and the nursing notes.  Pertinent labs & imaging results that were available during my care of the patient were reviewed by me and considered in my medical decision making (see chart for details).  Clinical impression: Few days of symptoms consistent with a UTI versus renal stones.  She  has dysuria, hematuria, increased urinary frequency and urgency and suprapubic abdominal plain and right-sided flank pain.  Treatment plan: 1.  The findings and treatment plan were discussed in detail with the patient.  Patient was in agreement. 2.  We obtained a UA.  Results are above.  Does show that she is a little dehydrated with an amber color and cloudy appearance to her urine.  Specific gravity is greater than 1.030.  She does have greater than 1000  glucose.  She does have a history of diabetes.  There is urine present as well.  She has trace ketones.  Trace leukocytes.  Greater than 50 WBCs, and greater than 50 RBCs.  There are also bacteria present.  We will treat for UTI and send off the culture. 3.  Educational handouts provided. 4.  Sent in a prescription for Macrobid and Pyridium. 5.  Plenty of rest, plenty of fluids, Tylenol or Motrin for any fever or discomfort. 6.  Discussed the fact that she may have a renal stone and that if her symptoms persisted or worsened that she should go to the ER for higher level of care and potential scanning.  She voiced verbal understanding. 7.  If her symptoms persist she should see her primary care provider. 8.  She was discharged from care in stable condition and will follow-up here as needed.  Extensive chart review was conducted.  Greater than 30 minutes was spent with the patient and doing the chart review.  Extensive history as well as review of systems and examination was conducted.  Decision making, treatment plan, antibiotic and medication prescribing, educating the patient as well as making sure she had appropriate follow-up was done during that time.    Final Clinical Impressions(s) / UC Diagnoses   Final diagnoses:  Dysuria  Acute cystitis with hematuria  Urinary frequency  Urinary urgency  Suprapubic abdominal pain  Flank pain     Discharge Instructions     Your urine indicates that you may have a urinary tract infection.  I sent off the culture. Based on your symptoms I am going to empirically treat you, but someone may contact you if the culture does not grow anything until you to stop the antibiotics.  Also if it does grow something in the antibiotics are resistant to the bacteria, they may tell you to stop it and change it to a different antibiotic. Please flush your system with plenty of fluids. I also sent in something for your painful urination. There was blood in your  urine, which may be a sign of kidney stones, if your pain persists or worsens in any way you need to go to the ER for higher level of care and potential scanning. If your symptoms persist please see your primary care provider. Plenty of rest, plenty of fluids, Tylenol or Motrin for any discomfort.    ED Prescriptions    Medication Sig Dispense Auth. Provider   nitrofurantoin, macrocrystal-monohydrate, (MACROBID) 100 MG capsule  (Status: Discontinued) Take 1 capsule (100 mg total) by mouth 2 (two) times daily. 10 capsule Verda Cumins, MD   nitrofurantoin, macrocrystal-monohydrate, (MACROBID) 100 MG capsule Take 1 capsule (100 mg total) by mouth 2 (two) times daily. 10 capsule Verda Cumins, MD   phenazopyridine (PYRIDIUM) 200 MG tablet Take 1 tablet (200 mg total) by mouth 3 (three) times daily. 6 tablet Verda Cumins, MD     PDMP not reviewed this encounter.   Verda Cumins, MD 08/10/20 1100

## 2020-08-10 NOTE — Discharge Instructions (Addendum)
Your urine indicates that you may have a urinary tract infection.  I sent off the culture. Based on your symptoms I am going to empirically treat you, but someone may contact you if the culture does not grow anything until you to stop the antibiotics.  Also if it does grow something in the antibiotics are resistant to the bacteria, they may tell you to stop it and change it to a different antibiotic. Please flush your system with plenty of fluids. I also sent in something for your painful urination. There was blood in your urine, which may be a sign of kidney stones, if your pain persists or worsens in any way you need to go to the ER for higher level of care and potential scanning. If your symptoms persist please see your primary care provider. Plenty of rest, plenty of fluids, Tylenol or Motrin for any discomfort.

## 2020-08-10 NOTE — ED Triage Notes (Signed)
Patient complains of right lower quadrant pain that radiates to her right flank and lower right back. Reports that she has had hematuria and urinary pain and frequency with urgency.

## 2020-08-12 LAB — URINE CULTURE

## 2020-10-27 ENCOUNTER — Emergency Department: Payer: Medicaid Other

## 2020-10-27 ENCOUNTER — Other Ambulatory Visit: Payer: Self-pay

## 2020-10-27 DIAGNOSIS — K219 Gastro-esophageal reflux disease without esophagitis: Secondary | ICD-10-CM | POA: Diagnosis present

## 2020-10-27 DIAGNOSIS — Z28310 Unvaccinated for covid-19: Secondary | ICD-10-CM

## 2020-10-27 DIAGNOSIS — Z888 Allergy status to other drugs, medicaments and biological substances status: Secondary | ICD-10-CM

## 2020-10-27 DIAGNOSIS — Z86718 Personal history of other venous thrombosis and embolism: Secondary | ICD-10-CM

## 2020-10-27 DIAGNOSIS — I200 Unstable angina: Secondary | ICD-10-CM | POA: Diagnosis present

## 2020-10-27 DIAGNOSIS — Z823 Family history of stroke: Secondary | ICD-10-CM

## 2020-10-27 DIAGNOSIS — Z86711 Personal history of pulmonary embolism: Secondary | ICD-10-CM

## 2020-10-27 DIAGNOSIS — Z7901 Long term (current) use of anticoagulants: Secondary | ICD-10-CM

## 2020-10-27 DIAGNOSIS — F17210 Nicotine dependence, cigarettes, uncomplicated: Secondary | ICD-10-CM | POA: Diagnosis present

## 2020-10-27 DIAGNOSIS — Z20822 Contact with and (suspected) exposure to covid-19: Secondary | ICD-10-CM | POA: Diagnosis present

## 2020-10-27 DIAGNOSIS — I10 Essential (primary) hypertension: Secondary | ICD-10-CM | POA: Diagnosis present

## 2020-10-27 DIAGNOSIS — E785 Hyperlipidemia, unspecified: Secondary | ICD-10-CM | POA: Diagnosis present

## 2020-10-27 DIAGNOSIS — R21 Rash and other nonspecific skin eruption: Secondary | ICD-10-CM | POA: Diagnosis not present

## 2020-10-27 DIAGNOSIS — Z7984 Long term (current) use of oral hypoglycemic drugs: Secondary | ICD-10-CM

## 2020-10-27 DIAGNOSIS — Z8249 Family history of ischemic heart disease and other diseases of the circulatory system: Secondary | ICD-10-CM

## 2020-10-27 DIAGNOSIS — M5136 Other intervertebral disc degeneration, lumbar region: Secondary | ICD-10-CM | POA: Diagnosis present

## 2020-10-27 DIAGNOSIS — Z6835 Body mass index (BMI) 35.0-35.9, adult: Secondary | ICD-10-CM

## 2020-10-27 DIAGNOSIS — E1165 Type 2 diabetes mellitus with hyperglycemia: Secondary | ICD-10-CM | POA: Diagnosis present

## 2020-10-27 DIAGNOSIS — E669 Obesity, unspecified: Secondary | ICD-10-CM | POA: Diagnosis present

## 2020-10-27 DIAGNOSIS — Z880 Allergy status to penicillin: Secondary | ICD-10-CM

## 2020-10-27 DIAGNOSIS — Z833 Family history of diabetes mellitus: Secondary | ICD-10-CM

## 2020-10-27 DIAGNOSIS — I214 Non-ST elevation (NSTEMI) myocardial infarction: Principal | ICD-10-CM | POA: Diagnosis present

## 2020-10-27 DIAGNOSIS — Z95828 Presence of other vascular implants and grafts: Secondary | ICD-10-CM

## 2020-10-27 DIAGNOSIS — J449 Chronic obstructive pulmonary disease, unspecified: Secondary | ICD-10-CM | POA: Diagnosis present

## 2020-10-27 DIAGNOSIS — Z825 Family history of asthma and other chronic lower respiratory diseases: Secondary | ICD-10-CM

## 2020-10-27 DIAGNOSIS — R768 Other specified abnormal immunological findings in serum: Secondary | ICD-10-CM | POA: Diagnosis present

## 2020-10-27 DIAGNOSIS — Z9851 Tubal ligation status: Secondary | ICD-10-CM

## 2020-10-27 DIAGNOSIS — Z79899 Other long term (current) drug therapy: Secondary | ICD-10-CM

## 2020-10-27 DIAGNOSIS — Z90710 Acquired absence of both cervix and uterus: Secondary | ICD-10-CM

## 2020-10-27 NOTE — ED Notes (Signed)
Patient reports blood thinner, PT INR ordered.

## 2020-10-27 NOTE — ED Triage Notes (Signed)
FIRST NURSE NOTE:  Pt arrived via [facility] c/o chest pain x 45 mins substernal rad to jaw and L arm  Hx of clots, 324 ASA given and 3 sprays NTG with EMS   BP 130/95, p-110 98% RA, CBG 207  22G L wrist  EKG = ST with prolong QT for EMS

## 2020-10-27 NOTE — ED Notes (Signed)
Patient reports hysterotomy. Urine preg cancelled by this RN.

## 2020-10-28 ENCOUNTER — Emergency Department: Payer: Medicaid Other

## 2020-10-28 ENCOUNTER — Inpatient Hospital Stay
Admission: EM | Admit: 2020-10-28 | Discharge: 2020-10-29 | DRG: 282 | Disposition: A | Discharge status: Home / Self Care | Payer: Medicaid Other | Attending: Internal Medicine | Admitting: Internal Medicine

## 2020-10-28 ENCOUNTER — Encounter
Admission: EM | Disposition: A | Discharge status: Home / Self Care | Payer: Self-pay | Source: Home / Self Care | Attending: Internal Medicine

## 2020-10-28 DIAGNOSIS — Z20822 Contact with and (suspected) exposure to covid-19: Secondary | ICD-10-CM | POA: Diagnosis present

## 2020-10-28 DIAGNOSIS — E118 Type 2 diabetes mellitus with unspecified complications: Secondary | ICD-10-CM | POA: Diagnosis not present

## 2020-10-28 DIAGNOSIS — R768 Other specified abnormal immunological findings in serum: Secondary | ICD-10-CM | POA: Diagnosis present

## 2020-10-28 DIAGNOSIS — Z6835 Body mass index (BMI) 35.0-35.9, adult: Secondary | ICD-10-CM | POA: Diagnosis not present

## 2020-10-28 DIAGNOSIS — K219 Gastro-esophageal reflux disease without esophagitis: Secondary | ICD-10-CM

## 2020-10-28 DIAGNOSIS — I10 Essential (primary) hypertension: Secondary | ICD-10-CM | POA: Diagnosis present

## 2020-10-28 DIAGNOSIS — Z833 Family history of diabetes mellitus: Secondary | ICD-10-CM | POA: Diagnosis not present

## 2020-10-28 DIAGNOSIS — Z86718 Personal history of other venous thrombosis and embolism: Secondary | ICD-10-CM | POA: Diagnosis not present

## 2020-10-28 DIAGNOSIS — Z8249 Family history of ischemic heart disease and other diseases of the circulatory system: Secondary | ICD-10-CM | POA: Diagnosis not present

## 2020-10-28 DIAGNOSIS — E1169 Type 2 diabetes mellitus with other specified complication: Secondary | ICD-10-CM | POA: Diagnosis present

## 2020-10-28 DIAGNOSIS — E1165 Type 2 diabetes mellitus with hyperglycemia: Secondary | ICD-10-CM | POA: Diagnosis present

## 2020-10-28 DIAGNOSIS — Z90710 Acquired absence of both cervix and uterus: Secondary | ICD-10-CM | POA: Diagnosis not present

## 2020-10-28 DIAGNOSIS — Z823 Family history of stroke: Secondary | ICD-10-CM | POA: Diagnosis not present

## 2020-10-28 DIAGNOSIS — E669 Obesity, unspecified: Secondary | ICD-10-CM | POA: Diagnosis present

## 2020-10-28 DIAGNOSIS — M5136 Other intervertebral disc degeneration, lumbar region: Secondary | ICD-10-CM | POA: Diagnosis present

## 2020-10-28 DIAGNOSIS — Z86711 Personal history of pulmonary embolism: Secondary | ICD-10-CM

## 2020-10-28 DIAGNOSIS — Z28310 Unvaccinated for covid-19: Secondary | ICD-10-CM | POA: Diagnosis not present

## 2020-10-28 DIAGNOSIS — E785 Hyperlipidemia, unspecified: Secondary | ICD-10-CM | POA: Diagnosis present

## 2020-10-28 DIAGNOSIS — I200 Unstable angina: Secondary | ICD-10-CM | POA: Diagnosis present

## 2020-10-28 DIAGNOSIS — I214 Non-ST elevation (NSTEMI) myocardial infarction: Secondary | ICD-10-CM | POA: Diagnosis present

## 2020-10-28 DIAGNOSIS — Z7901 Long term (current) use of anticoagulants: Secondary | ICD-10-CM | POA: Diagnosis not present

## 2020-10-28 DIAGNOSIS — J449 Chronic obstructive pulmonary disease, unspecified: Secondary | ICD-10-CM | POA: Diagnosis present

## 2020-10-28 DIAGNOSIS — Z7984 Long term (current) use of oral hypoglycemic drugs: Secondary | ICD-10-CM | POA: Diagnosis not present

## 2020-10-28 DIAGNOSIS — Z95828 Presence of other vascular implants and grafts: Secondary | ICD-10-CM | POA: Diagnosis not present

## 2020-10-28 DIAGNOSIS — F17210 Nicotine dependence, cigarettes, uncomplicated: Secondary | ICD-10-CM | POA: Diagnosis present

## 2020-10-28 DIAGNOSIS — Z825 Family history of asthma and other chronic lower respiratory diseases: Secondary | ICD-10-CM | POA: Diagnosis not present

## 2020-10-28 DIAGNOSIS — Z720 Tobacco use: Secondary | ICD-10-CM | POA: Diagnosis present

## 2020-10-28 HISTORY — PX: LEFT HEART CATH AND CORONARY ANGIOGRAPHY: CATH118249

## 2020-10-28 LAB — LIPID PANEL
Cholesterol: 215 mg/dL — ABNORMAL HIGH (ref 0–200)
HDL: 25 mg/dL — ABNORMAL LOW (ref >40)
LDL Cholesterol: 133 mg/dL — ABNORMAL HIGH (ref 0–99)
Total CHOL/HDL Ratio: 8.6 RATIO
Triglycerides: 284 mg/dL — ABNORMAL HIGH (ref <150)
VLDL: 57 mg/dL — ABNORMAL HIGH (ref 0–40)

## 2020-10-28 LAB — CBC WITH DIFFERENTIAL/PLATELET
Abs Immature Granulocytes: 0.12 10*3/uL — ABNORMAL HIGH (ref 0.00–0.07)
Basophils Absolute: 0 10*3/uL (ref 0.0–0.1)
Basophils Relative: 0 %
Eosinophils Absolute: 0 10*3/uL (ref 0.0–0.5)
Eosinophils Relative: 0 %
HCT: 44.9 % (ref 36.0–46.0)
Hemoglobin: 15 g/dL (ref 12.0–15.0)
Immature Granulocytes: 1 %
Lymphocytes Relative: 9 %
Lymphs Abs: 1.9 10*3/uL (ref 0.7–4.0)
MCH: 29.1 pg (ref 26.0–34.0)
MCHC: 33.4 g/dL (ref 30.0–36.0)
MCV: 87 fL (ref 80.0–100.0)
Monocytes Absolute: 0.4 10*3/uL (ref 0.1–1.0)
Monocytes Relative: 2 %
Neutro Abs: 18.1 10*3/uL — ABNORMAL HIGH (ref 1.7–7.7)
Neutrophils Relative %: 88 %
Platelets: 297 10*3/uL (ref 150–400)
RBC: 5.16 MIL/uL — ABNORMAL HIGH (ref 3.87–5.11)
RDW: 13.6 % (ref 11.5–15.5)
WBC: 20.6 10*3/uL — ABNORMAL HIGH (ref 4.0–10.5)
nRBC: 0 % (ref 0.0–0.2)

## 2020-10-28 LAB — BASIC METABOLIC PANEL
Anion gap: 5 (ref 5–15)
BUN: 11 mg/dL (ref 6–20)
CO2: 26 mmol/L (ref 22–32)
Calcium: 8.8 mg/dL — ABNORMAL LOW (ref 8.9–10.3)
Chloride: 102 mmol/L (ref 98–111)
Creatinine, Ser: 0.72 mg/dL (ref 0.44–1.00)
GFR, Estimated: >60 mL/min (ref >60)
Glucose, Bld: 199 mg/dL — ABNORMAL HIGH (ref 70–99)
Potassium: 4 mmol/L (ref 3.5–5.1)
Sodium: 133 mmol/L — ABNORMAL LOW (ref 135–145)

## 2020-10-28 LAB — TROPONIN I (HIGH SENSITIVITY)
Troponin I (High Sensitivity): 92 ng/L — ABNORMAL HIGH (ref <18)
Troponin I (High Sensitivity): 777 ng/L (ref <18)
Troponin I (High Sensitivity): 5681 ng/L (ref <18)
Troponin I (High Sensitivity): 5399 ng/L (ref <18)

## 2020-10-28 LAB — RESP PANEL BY RT-PCR (FLU A&B, COVID) ARPGX2
Influenza A by PCR: NEGATIVE
Influenza B by PCR: NEGATIVE
SARS Coronavirus 2 by RT PCR: NEGATIVE

## 2020-10-28 LAB — APTT
aPTT: 37 seconds — ABNORMAL HIGH (ref 24–36)
aPTT: 30 seconds (ref 24–36)

## 2020-10-28 LAB — CBC
HCT: 45.1 % (ref 36.0–46.0)
Hemoglobin: 15.1 g/dL — ABNORMAL HIGH (ref 12.0–15.0)
MCH: 29.2 pg (ref 26.0–34.0)
MCHC: 33.5 g/dL (ref 30.0–36.0)
MCV: 87.1 fL (ref 80.0–100.0)
Platelets: 301 10*3/uL (ref 150–400)
RBC: 5.18 MIL/uL — ABNORMAL HIGH (ref 3.87–5.11)
RDW: 13.5 % (ref 11.5–15.5)
WBC: 20.7 10*3/uL — ABNORMAL HIGH (ref 4.0–10.5)
nRBC: 0 % (ref 0.0–0.2)

## 2020-10-28 LAB — CBG MONITORING, ED
Glucose-Capillary: 136 mg/dL — ABNORMAL HIGH (ref 70–99)
Glucose-Capillary: 140 mg/dL — ABNORMAL HIGH (ref 70–99)

## 2020-10-28 LAB — BRAIN NATRIURETIC PEPTIDE: B Natriuretic Peptide: 22.5 pg/mL (ref 0.0–100.0)

## 2020-10-28 LAB — GLUCOSE, CAPILLARY
Glucose-Capillary: 115 mg/dL — ABNORMAL HIGH (ref 70–99)
Glucose-Capillary: 261 mg/dL — ABNORMAL HIGH (ref 70–99)

## 2020-10-28 LAB — HEMOGLOBIN A1C
Hgb A1c MFr Bld: 8.1 % — ABNORMAL HIGH (ref 4.8–5.6)
Mean Plasma Glucose: 185.77 mg/dL

## 2020-10-28 LAB — HEPARIN LEVEL (UNFRACTIONATED): Heparin Unfractionated: 0.4 IU/mL (ref 0.30–0.70)

## 2020-10-28 LAB — PROTIME-INR
INR: 1.1 (ref 0.8–1.2)
Prothrombin Time: 14.2 seconds (ref 11.4–15.2)

## 2020-10-28 SURGERY — LEFT HEART CATH AND CORONARY ANGIOGRAPHY
Anesthesia: Moderate Sedation

## 2020-10-28 MED ORDER — ATORVASTATIN CALCIUM 80 MG PO TABS
80.0000 mg | ORAL_TABLET | Freq: Every day | ORAL | Status: DC
  Administered 2020-10-29: 80 mg via ORAL
  Filled 2020-10-28 (×2): qty 1

## 2020-10-28 MED ORDER — INSULIN ASPART 100 UNIT/ML IJ SOLN
0.0000 [IU] | Freq: Three times a day (TID) | INTRAMUSCULAR | Status: DC
  Administered 2020-10-29: 5 [IU] via SUBCUTANEOUS
  Administered 2020-10-29: 3 [IU] via SUBCUTANEOUS
  Filled 2020-10-28 (×2): qty 1

## 2020-10-28 MED ORDER — LIDOCAINE HCL (PF) 1 % IJ SOLN
INTRAMUSCULAR | Status: DC | PRN
  Administered 2020-10-28: 2 mL

## 2020-10-28 MED ORDER — METHYLPREDNISOLONE SODIUM SUCC 125 MG IJ SOLR
INTRAMUSCULAR | Status: AC
  Administered 2020-10-28: 125 mg via INTRAVENOUS
  Filled 2020-10-28: qty 2

## 2020-10-28 MED ORDER — MORPHINE SULFATE (PF) 2 MG/ML IV SOLN
2.0000 mg | INTRAVENOUS | Status: DC | PRN
  Administered 2020-10-28 – 2020-10-29 (×5): 2 mg via INTRAVENOUS
  Filled 2020-10-28 (×5): qty 1

## 2020-10-28 MED ORDER — ONDANSETRON HCL 4 MG/2ML IJ SOLN
4.0000 mg | Freq: Once | INTRAMUSCULAR | Status: AC
  Administered 2020-10-28: 4 mg via INTRAVENOUS
  Filled 2020-10-28: qty 2

## 2020-10-28 MED ORDER — LABETALOL HCL 5 MG/ML IV SOLN: 10.0000 mg | INTRAVENOUS | Status: AC | PRN

## 2020-10-28 MED ORDER — IOHEXOL 350 MG/ML SOLN
100.0000 mL | Freq: Once | INTRAVENOUS | Status: AC | PRN
  Administered 2020-10-28: 100 mL via INTRAVENOUS

## 2020-10-28 MED ORDER — FAMOTIDINE 20 MG PO TABS
ORAL_TABLET | ORAL | Status: AC
  Administered 2020-10-28: 40 mg via ORAL
  Filled 2020-10-28: qty 2

## 2020-10-28 MED ORDER — SODIUM CHLORIDE 0.9 % WEIGHT BASED INFUSION
1.0000 mL/kg/h | INTRAVENOUS | Status: DC
  Administered 2020-10-28: 1 mL/kg/h via INTRAVENOUS

## 2020-10-28 MED ORDER — HEPARIN SODIUM (PORCINE) 1000 UNIT/ML IJ SOLN
INTRAMUSCULAR | Status: AC
  Filled 2020-10-28: qty 1

## 2020-10-28 MED ORDER — FENTANYL CITRATE (PF) 100 MCG/2ML IJ SOLN
INTRAMUSCULAR | Status: DC | PRN
  Administered 2020-10-28 (×4): 25 ug via INTRAVENOUS

## 2020-10-28 MED ORDER — HEPARIN SODIUM (PORCINE) 1000 UNIT/ML IJ SOLN
INTRAMUSCULAR | Status: DC | PRN
  Administered 2020-10-28: 5000 [IU] via INTRAVENOUS

## 2020-10-28 MED ORDER — ASPIRIN 325 MG PO TABS
325.0000 mg | ORAL_TABLET | Freq: Every day | ORAL | Status: DC
  Administered 2020-10-29: 325 mg via ORAL
  Filled 2020-10-28: qty 1

## 2020-10-28 MED ORDER — ACETAMINOPHEN 650 MG RE SUPP: 650.0000 mg | Freq: Four times a day (QID) | RECTAL | Status: DC | PRN

## 2020-10-28 MED ORDER — ASPIRIN 81 MG PO CHEW
CHEWABLE_TABLET | ORAL | Status: AC
  Filled 2020-10-28: qty 1

## 2020-10-28 MED ORDER — VERAPAMIL HCL 2.5 MG/ML IV SOLN
INTRAVENOUS | Status: AC
  Filled 2020-10-28: qty 2

## 2020-10-28 MED ORDER — PANTOPRAZOLE SODIUM 40 MG PO TBEC
40.0000 mg | DELAYED_RELEASE_TABLET | Freq: Every day | ORAL | Status: DC
  Administered 2020-10-28 – 2020-10-29 (×2): 40 mg via ORAL
  Filled 2020-10-28 (×2): qty 1

## 2020-10-28 MED ORDER — SODIUM CHLORIDE 0.9% FLUSH: 3.0000 mL | INTRAVENOUS | Status: DC | PRN

## 2020-10-28 MED ORDER — SODIUM CHLORIDE 0.9 % WEIGHT BASED INFUSION
3.0000 mL/kg/h | INTRAVENOUS | Status: DC
  Administered 2020-10-28: 3 mL/kg/h via INTRAVENOUS

## 2020-10-28 MED ORDER — FAMOTIDINE 20 MG PO TABS: 40.0000 mg | ORAL_TABLET | Freq: Once | ORAL | Status: AC

## 2020-10-28 MED ORDER — SODIUM CHLORIDE 0.9 % IV SOLN: 250.0000 mL | INTRAVENOUS | Status: DC | PRN

## 2020-10-28 MED ORDER — MIDAZOLAM HCL 2 MG/2ML IJ SOLN
INTRAMUSCULAR | Status: DC | PRN
  Administered 2020-10-28 (×4): 1 mg via INTRAVENOUS

## 2020-10-28 MED ORDER — HYDRALAZINE HCL 20 MG/ML IJ SOLN: 10.0000 mg | INTRAMUSCULAR | Status: AC | PRN

## 2020-10-28 MED ORDER — MORPHINE SULFATE (PF) 4 MG/ML IV SOLN
4.0000 mg | Freq: Once | INTRAVENOUS | Status: AC
Start: 2020-10-28 — End: 2020-10-28
  Administered 2020-10-28: 4 mg via INTRAVENOUS
  Filled 2020-10-28: qty 1

## 2020-10-28 MED ORDER — SODIUM CHLORIDE 0.9% FLUSH
3.0000 mL | Freq: Two times a day (BID) | INTRAVENOUS | Status: DC
  Administered 2020-10-29 (×2): 3 mL via INTRAVENOUS

## 2020-10-28 MED ORDER — ONDANSETRON HCL 4 MG PO TABS: 4.0000 mg | ORAL_TABLET | Freq: Four times a day (QID) | ORAL | Status: DC | PRN

## 2020-10-28 MED ORDER — DIPHENHYDRAMINE HCL 50 MG/ML IJ SOLN
INTRAMUSCULAR | Status: AC
  Filled 2020-10-28: qty 1

## 2020-10-28 MED ORDER — SODIUM CHLORIDE 0.9% FLUSH
3.0000 mL | Freq: Two times a day (BID) | INTRAVENOUS | Status: DC
  Administered 2020-10-28 – 2020-10-29 (×3): 3 mL via INTRAVENOUS

## 2020-10-28 MED ORDER — HEPARIN (PORCINE) IN NACL 2000-0.9 UNIT/L-% IV SOLN
INTRAVENOUS | Status: DC | PRN
  Administered 2020-10-28: 1000 mL

## 2020-10-28 MED ORDER — HEPARIN BOLUS VIA INFUSION
4000.0000 [IU] | Freq: Once | INTRAVENOUS | Status: AC
  Administered 2020-10-28: 4000 [IU] via INTRAVENOUS
  Filled 2020-10-28: qty 4000

## 2020-10-28 MED ORDER — METHYLPREDNISOLONE SODIUM SUCC 125 MG IJ SOLR: 125.0000 mg | Freq: Once | INTRAMUSCULAR | Status: AC

## 2020-10-28 MED ORDER — NICOTINE 21 MG/24HR TD PT24
21.0000 mg | MEDICATED_PATCH | Freq: Every day | TRANSDERMAL | Status: DC
  Filled 2020-10-28 (×2): qty 1

## 2020-10-28 MED ORDER — IOHEXOL 300 MG/ML  SOLN
INTRAMUSCULAR | Status: DC | PRN
  Administered 2020-10-28: 100 mL

## 2020-10-28 MED ORDER — ONDANSETRON HCL 4 MG/2ML IJ SOLN: 4.0000 mg | Freq: Four times a day (QID) | INTRAMUSCULAR | Status: DC | PRN

## 2020-10-28 MED ORDER — HEPARIN (PORCINE) 25000 UT/250ML-% IV SOLN: 14.0000 [IU]/kg/h | INTRAVENOUS | Status: DC

## 2020-10-28 MED ORDER — ASPIRIN 81 MG PO CHEW
81.0000 mg | CHEWABLE_TABLET | ORAL | Status: AC
  Administered 2020-10-28: 81 mg via ORAL

## 2020-10-28 MED ORDER — FENTANYL CITRATE (PF) 100 MCG/2ML IJ SOLN
INTRAMUSCULAR | Status: AC
  Filled 2020-10-28: qty 2

## 2020-10-28 MED ORDER — ACETAMINOPHEN 325 MG PO TABS: 650.0000 mg | ORAL_TABLET | Freq: Four times a day (QID) | ORAL | Status: DC | PRN

## 2020-10-28 MED ORDER — DIPHENHYDRAMINE HCL 50 MG/ML IJ SOLN
INTRAMUSCULAR | Status: DC | PRN
Start: 2020-10-28 — End: 2020-10-28
  Administered 2020-10-28: 50 mg via INTRAVENOUS

## 2020-10-28 MED ORDER — MIDAZOLAM HCL 2 MG/2ML IJ SOLN
INTRAMUSCULAR | Status: AC
  Filled 2020-10-28: qty 2

## 2020-10-28 MED ORDER — ACETAMINOPHEN 325 MG PO TABS: 650.0000 mg | ORAL_TABLET | ORAL | Status: DC | PRN

## 2020-10-28 MED ORDER — HEPARIN BOLUS VIA INFUSION
2500.0000 [IU] | Freq: Once | INTRAVENOUS | Status: AC
  Administered 2020-10-28: 2500 [IU] via INTRAVENOUS
  Filled 2020-10-28: qty 2500

## 2020-10-28 MED ORDER — NITROGLYCERIN 2 % TD OINT
0.5000 [in_us] | TOPICAL_OINTMENT | Freq: Four times a day (QID) | TRANSDERMAL | Status: DC
  Administered 2020-10-28 – 2020-10-29 (×5): 0.5 [in_us] via TOPICAL
  Filled 2020-10-28 (×5): qty 1

## 2020-10-28 MED ORDER — MORPHINE SULFATE (PF) 2 MG/ML IV SOLN
INTRAVENOUS | Status: AC
  Administered 2020-10-28: 2 mg via INTRAVENOUS
  Filled 2020-10-28: qty 1

## 2020-10-28 MED ORDER — SODIUM CHLORIDE 0.9 % WEIGHT BASED INFUSION: 1.0000 mL/kg/h | INTRAVENOUS | Status: AC

## 2020-10-28 MED ORDER — MORPHINE SULFATE (PF) 4 MG/ML IV SOLN
4.0000 mg | Freq: Once | INTRAVENOUS | Status: AC
  Administered 2020-10-28: 4 mg via INTRAVENOUS
  Filled 2020-10-28: qty 1

## 2020-10-28 MED ORDER — HEPARIN (PORCINE) 25000 UT/250ML-% IV SOLN
1450.0000 [IU]/h | INTRAVENOUS | Status: DC
  Administered 2020-10-28: 1150 [IU]/h via INTRAVENOUS
  Filled 2020-10-28: qty 250

## 2020-10-28 MED ORDER — LIDOCAINE HCL 1 % IJ SOLN
INTRAMUSCULAR | Status: AC
  Filled 2020-10-28: qty 20

## 2020-10-28 MED ORDER — HEPARIN SODIUM (PORCINE) 5000 UNIT/ML IJ SOLN: 4000.0000 [IU] | Freq: Once | INTRAMUSCULAR | Status: DC

## 2020-10-28 MED ORDER — VERAPAMIL HCL 2.5 MG/ML IV SOLN
INTRAVENOUS | Status: DC | PRN
  Administered 2020-10-28 (×2): 2.5 mg via INTRA_ARTERIAL

## 2020-10-28 MED ORDER — INSULIN ASPART 100 UNIT/ML IJ SOLN
0.0000 [IU] | Freq: Every day | INTRAMUSCULAR | Status: DC
  Administered 2020-10-28: 3 [IU] via SUBCUTANEOUS
  Filled 2020-10-28: qty 1

## 2020-10-28 SURGICAL SUPPLY — 11 items
CATH 5F 110X4 TIG (CATHETERS) ×2 IMPLANT
CATH INFINITI 5 FR JL3.5 (CATHETERS) ×2 IMPLANT
CATH INFINITI JR4 5F (CATHETERS) ×2 IMPLANT
DEVICE RAD TR BAND REGULAR (VASCULAR PRODUCTS) ×2 IMPLANT
DRAPE BRACHIAL (DRAPES) ×2 IMPLANT
GLIDESHEATH SLEND SS 6F .021 (SHEATH) ×2 IMPLANT
PACK CARDIAC CATH (CUSTOM PROCEDURE TRAY) ×2 IMPLANT
PROTECTION STATION PRESSURIZED (MISCELLANEOUS) ×2
SET ATX SIMPLICITY (MISCELLANEOUS) ×2 IMPLANT
STATION PROTECTION PRESSURIZED (MISCELLANEOUS) ×1 IMPLANT
WIRE STARTER ROSEN .035X260 (WIRE) ×2 IMPLANT

## 2020-10-28 NOTE — Assessment & Plan Note (Addendum)
Admit to telemetry bed. Continue with IV heparin. Cardiology consulted. Will defer plavix loading to cardiology. Continue with ASA. Check lipid panel. Start lipitor 80 mg daily. Trend troponins. NTG paste q6h. Prn IV morphine. Check echo.

## 2020-10-28 NOTE — Consult Note (Signed)
CARDIOLOGY CONSULT NOTE               Patient ID: [name] MRN: [id number] DOB/AGE: May 15, 1985 35 y.o.  Admit date: 10/28/2020 Referring Physician Dr Lorin Glass  Primary Physician Flossie Buffy FNP Primary Cardiologist unknown Reason for Consultation non-STEMI  HPI: Patient is a 35 year old white female history of diabetes obesity smoking history of PE DVT on Eliquis positive ANA presented with jaw pain shortness of breath suggestive of possible angina patient was found to have elevated troponins after episodes of nausea and vomiting x3 worsening chest pain she finally came to emergency room and received nitroglycerin EKGs were nondiagnostic but troponins elevated over 700 patient states she has had issues with substance abuse but is no longer on Suboxone continue to smoke but no drugs.  Patient was placed on heparin in place of Eliquis and advised to be admitted for further assessment  Review of systems complete and found to be negative unless listed above     Past Medical History:  Diagnosis Date   Collagen vascular disease (HCC)    HX: RT blood clot in leg, PE   Diabetes mellitus without complication (HCC)    Pulmonary embolism (HCC)     Past Surgical History:  Procedure Laterality Date   ABDOMINAL HYSTERECTOMY     IVC filter     tubal ligaton      (Not in a hospital admission)  Social History   Socioeconomic History   Marital status: Single    Spouse name: Not on file   Number of children: Not on file   Years of education: Not on file   Highest education level: Not on file  Occupational History   Not on file  Tobacco Use   Smoking status: Every Day    Packs/day: 0.50    Years: 17.00    Pack years: 8.50    Types: Cigarettes   Smokeless tobacco: Never  Vaping Use   Vaping Use: Never used  Substance and Sexual Activity   Alcohol use: No    Alcohol/week: 0.0 standard drinks   Drug use: No   Sexual activity: Not on file  Other Topics Concern   Not  on file  Social History Narrative   Not on file   Social Determinants of Health   Financial Resource Strain: Not on file  Food Insecurity: Not on file  Transportation Needs: Not on file  Physical Activity: Not on file  Stress: Not on file  Social Connections: Not on file  Intimate Partner Violence: Not on file    Family History  Problem Relation Age of Onset   Diabetes Mother    Hypertension Mother    COPD Mother    COPD Father    Stroke Father       Review of systems complete and found to be negative unless listed above      PHYSICAL EXAM  General: Well developed, well nourished, in no acute distress HEENT:  Normocephalic and atramatic Neck:  No JVD.  Lungs: Clear bilaterally to auscultation and percussion. Heart: HRRR . Normal S1 and S2 without gallops or murmurs.  Abdomen: Bowel sounds are positive, abdomen soft and non-tender  Msk:  Back normal, normal gait. Normal strength and tone for age. Extremities: No clubbing, cyanosis or edema.   Neuro: Alert and oriented X 3. Psych:  Good affect, responds appropriately  Labs:   Lab Results  Component Value Date   WBC 20.7 (H) 10/27/2020   WBC 20.6 (H)  10/27/2020   HGB 15.1 (H) 10/27/2020   HGB 15.0 10/27/2020   HCT 45.1 10/27/2020   HCT 44.9 10/27/2020   MCV 87.1 10/27/2020   MCV 87.0 10/27/2020   PLT 301 10/27/2020   PLT 297 10/27/2020    Recent Labs  Lab 10/27/20 2243  NA 133*  K 4.0  CL 102  CO2 26  BUN 11  CREATININE 0.72  CALCIUM 8.8*  GLUCOSE 199*   Lab Results  Component Value Date   CKTOTAL 72 03/09/2014   CKMB < 0.5 (L) 03/09/2014   TROPONINI <0.03 08/25/2014    Lab Results  Component Value Date   CHOL 161 07/13/2014   Lab Results  Component Value Date   HDL 21 (L) 07/13/2014   Lab Results  Component Value Date   LDLCALC SEE COMMENT 07/13/2014   Lab Results  Component Value Date   TRIG 500 (H) 07/13/2014   No results found for: CHOLHDL No results found for: LDLDIRECT     Radiology: DG Chest 2 View  Result Date: 10/27/2020 CLINICAL DATA:  Chest pain EXAM: CHEST - 2 VIEW COMPARISON:  12/28/2019 FINDINGS: Small right pleural effusion or pleural thickening with atelectasis or scar at the right base. No consolidation. Normal cardiac size. No pneumothorax IMPRESSION: Small right pleural effusion or thickening with probable scarring at the right base Electronically Signed   By: Jasmine Pang M.D.   On: 10/27/2020 23:12   CT Angio Chest PE W/Cm &/Or Wo Cm  Result Date: 10/28/2020 CLINICAL DATA:  Chest/left arm pain EXAM: CT ANGIOGRAPHY CHEST WITH CONTRAST TECHNIQUE: Multidetector CT imaging of the chest was performed using the standard protocol during bolus administration of intravenous contrast. Multiplanar CT image reconstructions and MIPs were obtained to evaluate the vascular anatomy. CONTRAST:  OMNIPAQUE IOHEXOL 350 MG/ML SOLN COMPARISON:  CT chest dated 12/28/2019 FINDINGS: Cardiovascular: Satisfactory opacification the bilateral pulmonary arteries to lobar level. No evidence of pulmonary embolism. Study is not tailored for evaluation of the thoracic aorta. No evidence of thoracic aortic aneurysm. Mild atherosclerotic calcifications of the aortic arch. Heart is normal in size.  No pericardial effusion. Mediastinum/Nodes: No suspicious mediastinal lymphadenopathy. Visualized thyroid is grossly unremarkable. Lungs/Pleura: Mild right basilar atelectasis. Lungs are otherwise clear. No suspicious pulmonary nodules. No focal consolidation. No pleural effusion or pneumothorax. Upper Abdomen: Visualized upper abdomen grossly unremarkable. Musculoskeletal: Visualized osseous structures are within normal limits. Review of the MIP images confirms the above findings. IMPRESSION: No evidence of pulmonary embolism. Negative CT chest. Aortic Atherosclerosis (ICD10-I70.0). Electronically Signed   By: Charline Bills M.D.   On: 10/28/2020 03:39    EKG: Normal sinus rhythm nonspecific  ST-T wave changes rate of 67  ASSESSMENT AND PLAN:  Non-STEMI Smoking Obesity History of DVT PE History of positive ANA Diabetes GERD COPD Hyperlipidemia  . Plan Agreed admit to telemetry Follow-up EKGs and troponins Cardiogram to assess for left-ventricular function wall motion Switch intravenous anticoagulation to heparin for now Consider cardiac cath because of non-STEMI elevated troponins Consider statin as well as hypertriglyceridemic medications like Tricor or gemfibrozil Continue diabetes management and control Recommend weight loss exercise portion control Advised the patient to refrain from smoking Inhalers as necessary for shortness of breath and COPD symptoms    Signed: Alwyn Pea MD 10/28/2020, 1:26 PM

## 2020-10-28 NOTE — Progress Notes (Signed)
PT came out to recovery with red and white splotchy rash on both arms, mildly itchy and tender on some areas of palpation. No swelling, arms are appropriately soft. No other sx or areas of rash. After solumedrol, L arm rash gone, R arm remains splotchy. MD called out to evaluated and states no further orders besides to keep elevated as already ordered. No hematoma, IV appears patent.

## 2020-10-28 NOTE — ED Provider Notes (Signed)
[facility] Emergency Department Provider Note   ____________________________________________   None    (approximate)  I have reviewed the triage vital signs and the nursing notes.   HISTORY  Chief Complaint Chest Pain and Jaw Pain    HPI [name] is a 35 y.o. female brought to the ED via EMS from home with a chief complaint of chest pain.  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  Reports pain radiating to her jaw and left arm.  Given 324 mg aspirin and 3 sprays nitroglycerin with EMS.  Denies recent fever, cough, shortness of breath, abdominal pain, nausea, vomiting or dizziness.  Patient is unvaccinated against COVID-19.     Past Medical History:  Diagnosis Date   Collagen vascular disease (HCC)    HX: RT blood clot in leg, PE   Diabetes mellitus without complication (HCC)    Pulmonary embolism [facility](HCC)     Patient Active Problem List   Diagnosis Date Noted   NSTEMI (non-ST elevated myocardial infarction) (HCC) 10/28/2020   Postphlebitic syndrome 04/21/2015   DDD (degenerative disc disease), lumbar 04/21/2015   Obesity, Class II, BMI 35-39.9 04/21/2015   Anxiety, generalized 02/04/2015   Hx pulmonary embolism 08/16/2014   Acid reflux 03/25/2014   Current tobacco use 03/25/2014   H/O deep venous thrombosis 12/01/2013    Past Surgical History:  Procedure Laterality Date   ABDOMINAL HYSTERECTOMY     IVC filter     tubal ligaton      Prior to Admission medications   Medication Sig Start Date End Date Taking? Authorizing Provider  apixaban (ELIQUIS) 5 MG TABS tablet Take 5 mg by mouth 2 (two) times daily.   Yes [provider]  metFORMIN (GLUCOPHAGE) 500 MG tablet Take 500 mg by mouth 2 (two) times daily with a meal.   Yes [provider]  omeprazole  (PRILOSEC) 40 MG capsule Take 1 capsule (40 mg total) by mouth daily. 04/21/15  Yes Plonk, Chrissie NoaWilliam, MD  albuterol (VENTOLIN HFA) 108 (90 Base) MCG/ACT inhaler Inhale 1-2 puffs into the lungs every 6 (six) hours as needed for wheezing or shortness of breath. Patient not taking: Reported on 10/28/2020 12/25/19   Tommie Samsook, Jayce G, DO  Buprenorphine HCl-Naloxone HCl 8-2 MG FILM Place under the tongue. Patient not taking: Reported on 10/28/2020    [provider]  dicyclomine (BENTYL) 10 MG capsule Take 1 capsule (10 mg total) by mouth 3 (three) times daily as needed for up to 14 days for spasms. 05/23/20 06/06/20  Willy Eddyobinson, Patrick, MD  gabapentin (NEURONTIN) 300 MG capsule Take 1 capsule by mouth daily. Patient not taking: Reported on 10/28/2020 05/12/20   [provider]  mupirocin ointment (BACTROBAN) 2 % Apply 1 application topically 2 (two) times daily. Patient not taking: Reported on 10/28/2020 06/20/19   Domenick GongMortenson, Ashley, MD  nitrofurantoin, macrocrystal-monohydrate, (MACROBID) 100 MG capsule Take 1 capsule (100 mg total) by mouth 2 (two) times daily. Patient not taking: Reported on 10/28/2020 08/10/20   Delton SeeBarnes, Kenneth, MD  nystatin ointment (MYCOSTATIN) Apply 1 application topically 2 (two) times daily. Until symptoms resolve Patient not taking: Reported on 10/28/2020 06/20/19   Domenick GongMortenson, Ashley, MD  ondansetron (ZOFRAN) 4 MG tablet Take 1 tablet (4 mg total) by mouth daily as needed. Patient not taking: Reported on 10/28/2020 05/23/20 05/23/21  Willy Eddyobinson, Patrick, MD  phenazopyridine (PYRIDIUM) 200 MG tablet Take 1 tablet (200 mg total) by mouth 3 (three) times daily. Patient not taking: Reported on 10/28/2020 08/10/20   Delton See, MD  predniSONE (DELTASONE) 50 MG tablet 1 tablet daily x 5 days Patient not taking: Reported on 10/28/2020 12/25/19   Tommie Sams, DO  clonazePAM (KLONOPIN) 0.5 MG tablet Take 1 mg by mouth 2 (two) times daily.   06/20/19  [provider]   oxyCODONE-acetaminophen (PERCOCET/ROXICET) 5-325 MG tablet Take 1 tablet by mouth every 4 (four) hours as needed. 10/13/15 06/20/19  Yevette Edwards, MD    Allergies Penicillins, Xarelto [rivaroxaban], and Ibuprofen  Family History  Problem Relation Age of Onset   Diabetes Mother    Hypertension Mother    COPD Mother    COPD Father    Stroke Father     Social History Social History   Tobacco Use   Smoking status: Every Day    Packs/day: 0.50    Years: 17.00    Pack years: 8.50    Types: Cigarettes   Smokeless tobacco: Never  Vaping Use   Vaping Use: Never used  Substance Use Topics   Alcohol use: No    Alcohol/week: 0.0 standard drinks   Drug use: No    Review of Systems  Constitutional: No fever/chills Eyes: No visual changes. ENT: No sore throat. Cardiovascular: Positive for chest pain. Respiratory: Denies shortness of breath. Gastrointestinal: No abdominal pain.  No nausea, no vomiting.  No diarrhea.  No constipation. Genitourinary: Negative for dysuria. Musculoskeletal: Negative for back pain. Skin: Negative for rash. Neurological: Negative for headaches, focal weakness or numbness.   ____________________________________________   PHYSICAL EXAM:  VITAL SIGNS: ED Triage Vitals  Enc Vitals Group     BP 10/27/20 2228 112/61     Pulse Rate 10/27/20 2228 100     Resp 10/27/20 2228 20     Temp 10/27/20 2228 98.4 F (36.9 C)     Temp Source 10/27/20 2228 Oral     SpO2 10/27/20 2228 96 %     Weight 10/27/20 2228 230 lb (104.3 kg)     Height 10/27/20 2228 5\' 5"  (1.651 m)     Head Circumference --      Peak Flow --      Pain Score 10/27/20 2245 10     Pain Loc --      Pain Edu? --      Excl. in GC? --     Constitutional: Alert and oriented. Well appearing and in mild acute distress. Eyes: Conjunctivae are normal. PERRL. EOMI. Head: Atraumatic. Nose: No congestion/rhinnorhea. Mouth/Throat: Mucous membranes are moist.   Neck: No stridor.    Cardiovascular: Normal rate, regular rhythm. Grossly normal heart sounds.  Good peripheral circulation. Respiratory: Normal respiratory effort.  No retractions. Lungs CTAB. Gastrointestinal: Soft and nontender to light or deep palpation. No distention. No abdominal bruits. No CVA tenderness. Musculoskeletal: No lower extremity tenderness nor edema.  No joint effusions. Neurologic:  Normal speech and language. No gross focal neurologic deficits are appreciated. No gait instability. Skin:  Skin is warm, dry and intact. No rash noted. Psychiatric: Mood and affect are normal. Speech and behavior are normal.  ____________________________________________   LABS (all labs ordered are listed, but only abnormal results are displayed)  Labs Reviewed  BASIC METABOLIC PANEL - Abnormal; Notable for the following components:      Result Value   Sodium 133 (*)    Glucose, Bld 199 (*)  Calcium 8.8 (*)    All other components within normal limits  CBC - Abnormal; Notable for the following components:   WBC 20.7 (*)    RBC 5.18 (*)    Hemoglobin 15.1 (*)    All other components within normal limits  CBC WITH DIFFERENTIAL/PLATELET - Abnormal; Notable for the following components:   WBC 20.6 (*)    RBC 5.16 (*)    Neutro Abs 18.1 (*)    Abs Immature Granulocytes 0.12 (*)    All other components within normal limits  TROPONIN I (HIGH SENSITIVITY) - Abnormal; Notable for the following components:   Troponin I (High Sensitivity) 92 (*)    All other components within normal limits  TROPONIN I (HIGH SENSITIVITY) - Abnormal; Notable for the following components:   Troponin I (High Sensitivity) 777 (*)    All other components within normal limits  RESP PANEL BY RT-PCR (FLU A&B, COVID) ARPGX2  PROTIME-INR  BRAIN NATRIURETIC PEPTIDE  HEPARIN LEVEL (UNFRACTIONATED)   ____________________________________________  EKG  ED ECG REPORT I, Dayshia Ballinas J, the attending physician, personally viewed and  interpreted this ECG.   Date: 10/28/2020  EKG Time: 2225  Rate: 103  Rhythm: sinus tachycardia  Axis: Normal  Intervals:none  ST&T Change: Nonspecific  ____________________________________________  RADIOLOGY I, Charleton Deyoung J, personally viewed and evaluated these images (plain radiographs) as part of my medical decision making, as well as reviewing the written report by the radiologist.  ED MD interpretation: Chest x-ray demonstrates small right pleural effusion; CTA chest negative for PE  Official radiology report(s): DG Chest 2 View  Result Date: 10/27/2020 CLINICAL DATA:  Chest pain EXAM: CHEST - 2 VIEW COMPARISON:  12/28/2019 FINDINGS: Small right pleural effusion or pleural thickening with atelectasis or scar at the right base. No consolidation. Normal cardiac size. No pneumothorax IMPRESSION: Small right pleural effusion or thickening with probable scarring at the right base Electronically Signed   By: Jasmine Pang M.D.   On: 10/27/2020 23:12   CT Angio Chest PE W/Cm &/Or Wo Cm  Result Date: 10/28/2020 CLINICAL DATA:  Chest/left arm pain EXAM: CT ANGIOGRAPHY CHEST WITH CONTRAST TECHNIQUE: Multidetector CT imaging of the chest was performed using the standard protocol during bolus administration of intravenous contrast. Multiplanar CT image reconstructions and MIPs were obtained to evaluate the vascular anatomy. CONTRAST:  OMNIPAQUE IOHEXOL 350 MG/ML SOLN COMPARISON:  CT chest dated 12/28/2019 FINDINGS: Cardiovascular: Satisfactory opacification the bilateral pulmonary arteries to lobar level. No evidence of pulmonary embolism. Study is not tailored for evaluation of the thoracic aorta. No evidence of thoracic aortic aneurysm. Mild atherosclerotic calcifications of the aortic arch. Heart is normal in size.  No pericardial effusion. Mediastinum/Nodes: No suspicious mediastinal lymphadenopathy. Visualized thyroid is grossly unremarkable. Lungs/Pleura: Mild right basilar atelectasis.  Lungs are otherwise clear. No suspicious pulmonary nodules. No focal consolidation. No pleural effusion or pneumothorax. Upper Abdomen: Visualized upper abdomen grossly unremarkable. Musculoskeletal: Visualized osseous structures are within normal limits. Review of the MIP images confirms the above findings. IMPRESSION: No evidence of pulmonary embolism. Negative CT chest. Aortic Atherosclerosis (ICD10-I70.0). Electronically Signed   By: Charline Bills M.D.   On: 10/28/2020 03:39    ____________________________________________   PROCEDURES  Procedure(s) performed (including Critical Care):  .1-3 Lead EKG Interpretation  Date/Time: 10/28/2020 2:11 AM Performed by: Irean Hong, MD Authorized by: Irean Hong, MD     Interpretation: normal     ECG rate:  90   ECG rate assessment: normal  Rhythm: sinus rhythm     Ectopy: none     Conduction: normal   Comments:     Patient placed on cardiac monitor to evaluate for arrhythmias  CRITICAL CARE Performed by: Irean Hong   Total critical care time: 45 minutes  Critical care time was exclusive of separately billable procedures and treating other patients.  Critical care was necessary to treat or prevent imminent or life-threatening deterioration.  Critical care was time spent personally by me on the following activities: development of treatment plan with patient and/or surrogate as well as nursing, discussions with consultants, evaluation of patient's response to treatment, examination of patient, obtaining history from patient or surrogate, ordering and performing treatments and interventions, ordering and review of laboratory studies, ordering and review of radiographic studies, pulse oximetry and re-evaluation of patient's condition.    ____________________________________________   INITIAL IMPRESSION / ASSESSMENT AND PLAN / ED COURSE  As part of my medical decision making, I reviewed the following data within the electronic  MEDICAL RECORD NUMBER Nursing notes reviewed and incorporated, Labs reviewed, EKG interpreted, Old chart reviewed, Radiograph reviewed, Discussed with admitting physician, and Notes from prior ED visits     35 year old female presenting with chest pain, history of PE/DVT currently on Eliquis. Differential diagnosis includes, but is not limited to, ACS, aortic dissection, pulmonary embolism, cardiac tamponade, pneumothorax, pneumonia, pericarditis, myocarditis, GI-related causes including esophagitis/gastritis, and musculoskeletal chest wall pain.     Initial troponin 92.  Will obtain CTA chest to evaluate for PE.  Administer IV morphine for chest pain.  Anticipate hospitalization.  Clinical Course as of 10/28/20 0435  Fri Oct 28, 2020  0211 CTA chest negative for PE.  Will initiate heparin bolus with drip and discussed with hospitalist services for admission. [JS]  0255 Increased troponin noted.  Consider heparin after CTA chest; patient on Eliquis with IVC filter, ensure no hemorrhage prior to starting heparin. [JS]    Clinical Course User Index [JS] Irean Hong, MD     ____________________________________________   FINAL CLINICAL IMPRESSION(S) / ED DIAGNOSES  Final diagnoses:  Unstable angina pectoris [facility])  NSTEMI (non-ST elevated myocardial infarction) [facility])     ED Discharge Orders     None        Note:  This document was prepared using Dragon voice recognition software and may include unintentional dictation errors.    Irean Hong, MD 10/28/20 [phone]

## 2020-10-28 NOTE — H&P (Signed)
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 No exertional component to jaw pain. Pt states she was sitting in her home watching TV when jaw pain started. Nothing makes it worse or better. Started having chest pain last night around 8 pm. +N/V. Vomiting x 3. + SOB.  Worsening chest pain this AM, called EMS. Received NTG.  Pt states she does not take suboxone any longer. Has been off of it for at least 1 year.  Continues to smoke. No IV drugs.  In ER, EKG showed sinus tachycardia. Initial Troponin was 777.  Last dose of Eliquis yesterday evening around 8 pm. Pt denies missing any doses.  Started on IV heparin. Pt received ASA 325 mg with ambulance. Cardiology consulted. TRH consulted for admission.   ED Course: initial troponin 777. Pt given ASA by EMS. Started on IV heparin gtts. Cardiology consulted.  Review of Systems:  Review of Systems  HENT: Negative.    Respiratory:  Positive for shortness of breath.   Cardiovascular:  Positive for chest pain.       +jaw pain. +chest pain. +radiation to back  Gastrointestinal:  Positive for nausea and vomiting.  Genitourinary: Negative.   Musculoskeletal:  Positive for back pain.  Skin: Negative.   Neurological: Negative.   Endo/Heme/Allergies: Negative.   Psychiatric/Behavioral: Negative.    All other systems reviewed and are negative.  Past Medical History:  Diagnosis Date   Collagen vascular disease (HCC)    HX: RT blood clot in leg, PE   Diabetes mellitus without complication (HCC)    Pulmonary embolism (HCC)     Past Surgical History:  Procedure Laterality Date    ABDOMINAL HYSTERECTOMY     IVC filter     tubal ligaton       reports that she has been smoking cigarettes. She has a 8.50 pack-year smoking history. She has never used smokeless tobacco. She reports that she does not drink alcohol and does not use drugs.  Allergies  Allergen Reactions   Penicillins Other (See Comments)    Reaction:  Unknown   Xarelto [Rivaroxaban] Other (See Comments)    Reaction: Pt said it "made me feel like I was drunk."   Ibuprofen Rash    Family History  Problem Relation Age of Onset   Diabetes Mother    Hypertension Mother    COPD Mother    COPD Father    Stroke Father     Prior to Admission medications   Medication Sig Start Date End Date Taking? Authorizing Provider  apixaban (ELIQUIS) 5 MG TABS tablet Take 5 mg by mouth 2 (two) times daily.   Yes [provider]  metFORMIN (GLUCOPHAGE) 500 MG tablet Take 500 mg by mouth 2 (two) times daily with a meal.   Yes [provider]  omeprazole (PRILOSEC) 40 MG capsule Take 1 capsule (40 mg total) by mouth daily. 04/21/15  Yes Plonk, Chrissie Noa, MD  albuterol (VENTOLIN HFA) 108 (90 Base) MCG/ACT inhaler Inhale 1-2 puffs into the lungs every 6 (six) hours as needed for wheezing or shortness of breath. Patient not  taking: Reported on 10/28/2020 12/25/19   Tommie Sams, DO  Buprenorphine HCl-Naloxone HCl 8-2 MG FILM Place under the tongue. Patient not taking: Reported on 10/28/2020    [provider]  dicyclomine (BENTYL) 10 MG capsule Take 1 capsule (10 mg total) by mouth 3 (three) times daily as needed for up to 14 days for spasms. 05/23/20 06/06/20  Willy Eddy, MD  gabapentin (NEURONTIN) 300 MG capsule Take 1 capsule by mouth daily. Patient not taking: Reported on 10/28/2020 05/12/20   [provider]  mupirocin ointment (BACTROBAN) 2 % Apply 1 application topically 2 (two) times daily. Patient not taking: Reported on 10/28/2020 06/20/19   Domenick Gong, MD  nitrofurantoin,  macrocrystal-monohydrate, (MACROBID) 100 MG capsule Take 1 capsule (100 mg total) by mouth 2 (two) times daily. Patient not taking: Reported on 10/28/2020 08/10/20   Delton See, MD  nystatin ointment (MYCOSTATIN) Apply 1 application topically 2 (two) times daily. Until symptoms resolve Patient not taking: Reported on 10/28/2020 06/20/19   Domenick Gong, MD  ondansetron (ZOFRAN) 4 MG tablet Take 1 tablet (4 mg total) by mouth daily as needed. Patient not taking: Reported on 10/28/2020 05/23/20 05/23/21  Willy Eddy, MD  phenazopyridine (PYRIDIUM) 200 MG tablet Take 1 tablet (200 mg total) by mouth 3 (three) times daily. Patient not taking: Reported on 10/28/2020 08/10/20   Delton See, MD  predniSONE (DELTASONE) 50 MG tablet 1 tablet daily x 5 days Patient not taking: Reported on 10/28/2020 12/25/19   Tommie Sams, DO  clonazePAM (KLONOPIN) 0.5 MG tablet Take 1 mg by mouth 2 (two) times daily.   06/20/19  [provider]  oxyCODONE-acetaminophen (PERCOCET/ROXICET) 5-325 MG tablet Take 1 tablet by mouth every 4 (four) hours as needed. 10/13/15 06/20/19  Yevette Edwards, MD    Physical Exam: Vitals:   10/28/20 0330 10/28/20 0430 10/28/20 0630 10/28/20 0700  BP: 109/68 110/71 111/70 108/70  Pulse: 80 70 69 77  Resp: (!) 8 13 17    Temp:      TempSrc:      SpO2: 96% 97% 95% 96%  Weight:      Height:        Physical Exam Vitals and nursing note reviewed.  Constitutional:      General: She is not in acute distress.    Appearance: She is obese. She is not ill-appearing, toxic-appearing or diaphoretic.  HENT:     Head: Normocephalic and atraumatic.  Cardiovascular:     Rate and Rhythm: Normal rate and regular rhythm.     Heart sounds: Normal heart sounds.  Pulmonary:     Effort: Pulmonary effort is normal.     Breath sounds: Normal breath sounds.  Abdominal:     General: Bowel sounds are normal.     Palpations: Abdomen is soft. There is no mass.     Tenderness: no abdominal  tenderness  Musculoskeletal:     Right lower leg: No edema.     Left lower leg: No edema.  Skin:    General: Skin is warm and dry.     Capillary Refill: Capillary refill takes less than 2 seconds.  Neurological:     General: No focal deficit present.     Mental Status: She is alert and oriented to person, place, and time.     Labs on Admission: I have personally reviewed following labs and imaging studies  CBC: Recent Labs  Lab 10/27/20 2243  WBC 20.6*  20.7*  NEUTROABS 18.1*  HGB 15.0  15.1*  HCT 44.9  45.1  MCV 87.0  87.1  PLT 297  301   Basic Metabolic Panel: Recent Labs  Lab 10/27/20 2243  NA 133*  K 4.0  CL 102  CO2 26  GLUCOSE 199*  BUN 11  CREATININE 0.72  CALCIUM 8.8*   GFR: Estimated Creatinine Clearance: 116.5 mL/min (by C-G formula based on SCr of 0.72 mg/dL). Liver Function Tests: No results for input(s): AST, ALT, ALKPHOS, BILITOT, PROT, ALBUMIN in the last 168 hours. No results for input(s): LIPASE, AMYLASE in the last 168 hours. No results for input(s): AMMONIA in the last 168 hours. Coagulation Profile: Recent Labs  Lab 10/27/20 2243  INR 1.1   Cardiac Enzymes: No results for input(s): CKTOTAL, CKMB, CKMBINDEX, TROPONINI in the last 168 hours. BNP (last 3 results) No results for input(s): PROBNP in the last 8760 hours. HbA1C: No results for input(s): HGBA1C in the last 72 hours. CBG: No results for input(s): GLUCAP in the last 168 hours. Lipid Profile: No results for input(s): CHOL, HDL, LDLCALC, TRIG, CHOLHDL, LDLDIRECT in the last 72 hours. Thyroid Function Tests: No results for input(s): TSH, T4TOTAL, FREET4, T3FREE, THYROIDAB in the last 72 hours. Anemia Panel: No results for input(s): VITAMINB12, FOLATE, FERRITIN, TIBC, IRON, RETICCTPCT in the last 72 hours. Urine analysis:    Component Value Date/Time   COLORURINE AMBER (A) 08/10/2020 0944   APPEARANCEUR CLOUDY (A) 08/10/2020 0944   APPEARANCEUR Cloudy 03/03/2014 2107    LABSPEC >1.030 (H) 08/10/2020 0944   LABSPEC 1.031 03/03/2014 2107   PHURINE 5.5 08/10/2020 0944   GLUCOSEU >1,000 (A) 08/10/2020 0944   GLUCOSEU 50 mg/dL 67/02/4579 9983   HGBUR LARGE (A) 08/10/2020 0944   BILIRUBINUR SMALL (A) 08/10/2020 0944   BILIRUBINUR Negative 03/03/2014 2107   KETONESUR TRACE (A) 08/10/2020 0944   PROTEINUR 100 (A) 08/10/2020 0944   NITRITE NEGATIVE 08/10/2020 0944   LEUKOCYTESUR TRACE (A) 08/10/2020 0944   LEUKOCYTESUR Negative 03/03/2014 2107    Radiological Exams on Admission: I have personally reviewed images DG Chest 2 View  Result Date: 10/27/2020 CLINICAL DATA:  Chest pain EXAM: CHEST - 2 VIEW COMPARISON:  12/28/2019 FINDINGS: Small right pleural effusion or pleural thickening with atelectasis or scar at the right base. No consolidation. Normal cardiac size. No pneumothorax IMPRESSION: Small right pleural effusion or thickening with probable scarring at the right base Electronically Signed   By: Jasmine Pang M.D.   On: 10/27/2020 23:12   CT Angio Chest PE W/Cm &/Or Wo Cm  Result Date: 10/28/2020 CLINICAL DATA:  Chest/left arm pain EXAM: CT ANGIOGRAPHY CHEST WITH CONTRAST TECHNIQUE: Multidetector CT imaging of the chest was performed using the standard protocol during bolus administration of intravenous contrast. Multiplanar CT image reconstructions and MIPs were obtained to evaluate the vascular anatomy. CONTRAST:  OMNIPAQUE IOHEXOL 350 MG/ML SOLN COMPARISON:  CT chest dated 12/28/2019 FINDINGS: Cardiovascular: Satisfactory opacification the bilateral pulmonary arteries to lobar level. No evidence of pulmonary embolism. Study is not tailored for evaluation of the thoracic aorta. No evidence of thoracic aortic aneurysm. Mild atherosclerotic calcifications of the aortic arch. Heart is normal in size.  No pericardial effusion. Mediastinum/Nodes: No suspicious mediastinal lymphadenopathy. Visualized thyroid is grossly unremarkable. Lungs/Pleura: Mild right  basilar atelectasis. Lungs are otherwise clear. No suspicious pulmonary nodules. No focal consolidation. No pleural effusion or pneumothorax. Upper Abdomen: Visualized upper abdomen grossly unremarkable. Musculoskeletal: Visualized osseous structures are within normal limits. Review of the MIP images confirms the above findings. IMPRESSION: No evidence  of pulmonary embolism. Negative CT chest. Aortic Atherosclerosis (ICD10-I70.0). Electronically Signed   By: Charline BillsSriyesh  Krishnan M.D.   On: 10/28/2020 03:39    EKG: I have personally reviewed EKG: sinus tachycardia   Assessment/Plan Principal Problem:   NSTEMI (non-ST elevated myocardial infarction) (HCC) Active Problems:   Type 2 diabetes with complication (HCC)   Hx pulmonary embolism   Positive ANA (antinuclear antibody)   Tobacco abuse   GERD (gastroesophageal reflux disease)    NSTEMI (non-ST elevated myocardial infarction) (HCC) Admit to telemetry bed. Continue with IV heparin. Cardiology consulted. Will defer plavix loading to cardiology. Continue with ASA. Check lipid panel. Start lipitor 80 mg daily. Trend troponins. NTG paste q6h. Prn IV morphine. Check echo.  Type 2 diabetes with complication (HCC) Hold metformin. Check A1c. Add SSI  Hx pulmonary embolism Holding Eliquis while on heparin gtts. Last dose of eliquis yesterday evening.  Positive ANA (antinuclear antibody) Pt has been [facility] in the past. Workup negative for her hypercoagulable state.  Tobacco abuse Pt continues to smoke. Nicotine patch ordered.  GERD (gastroesophageal reflux disease) Continue PPI.  DVT prophylaxis: IV heparin gtts Code Status: Full Code Family Communication: no family at bedside  Disposition Plan: DC to home  Consults called: cardiology called by EDP  Admission status: Inpatient, Telemetry bed   Carollee HerterEric Juliona Vales, DO [facility] 10/28/2020, 7:26 AM

## 2020-10-28 NOTE — Progress Notes (Addendum)
ANTICOAGULATION CONSULT NOTE  Pharmacy Consult for heparin infusion Indication: ACS/STEMI  Allergies  Allergen Reactions   Penicillins Other (See Comments)    Reaction:  Unknown   Xarelto [Rivaroxaban] Other (See Comments)    Reaction: Pt said it "made me feel like I was drunk."   Ibuprofen Rash    Patient Measurements: Height: 5\' 5"  (165.1 cm) Weight: 104.3 kg (230 lb) IBW/kg (Calculated) : 57 Heparin Dosing Weight: 81.2 kg  Vital Signs: Temp: 98.4 F (36.9 C) (08/11 2228) Temp Source: Oral (08/11 2228) BP: 109/68 (08/12 0330) Pulse Rate: 80 (08/12 0330)  Labs: Recent Labs    10/27/20 2243 10/28/20 0123  HGB 15.0  15.1*  --   HCT 44.9  45.1  --   PLT 297  301  --   LABPROT 14.2  --   INR 1.1  --   CREATININE 0.72  --   TROPONINIHS 92* 777*    Estimated Creatinine Clearance: 116.5 mL/min (by C-G formula based on SCr of 0.72 mg/dL).   Medical History: Past Medical History:  Diagnosis Date   Collagen vascular disease (HCC)    HX: RT blood clot in leg, PE   Diabetes mellitus without complication (HCC)    Pulmonary embolism (HCC)     Medications:  PTA:  Apixaban 5 mg BID  Assessment: Pt is 35 yo female presenting to ED via EMS with radiating chest pain.  Pt found to have elevated Troponin I, trending up and EKG = ST with prolong QT.  Goal of Therapy:  Heparin level 0.3-0.7 units/ml Monitor platelets by anticoagulation protocol: Yes Goal aPTT: 66-102   Plan:  Bolus 4000 units x 1 Start Heparin infusion at 1150 units/hr Will follow aPTT until correlation with HL Will order aPTT 6 hr after start of infusion Daily HL and CBC while on heparin.  31, PharmD, [facility] 10/28/2020 4:16 AM

## 2020-10-28 NOTE — Progress Notes (Signed)
ANTICOAGULATION CONSULT NOTE  Pharmacy Consult for heparin infusion Indication: ACS/STEMI  Allergies  Allergen Reactions   Penicillins Other (See Comments)    Reaction:  Unknown   Xarelto [Rivaroxaban] Other (See Comments)    Reaction: Pt said it "made me feel like I was drunk."   Ibuprofen Rash    Patient Measurements: Height: 5\' 5"  (165.1 cm) Weight: 104.3 kg (230 lb) IBW/kg (Calculated) : 57 Heparin Dosing Weight: 81.2 kg  Vital Signs: BP: 94/59 (08/12 1338) Pulse Rate: 64 (08/12 1338)  Labs: Recent Labs    10/27/20 2243 10/28/20 0123 10/28/20 0424 10/28/20 1300  HGB 15.0  15.1*  --   --   --   HCT 44.9  45.1  --   --   --   PLT 297  301  --   --   --   APTT  --   --   --  37*  LABPROT 14.2  --   --   --   INR 1.1  --   --   --   HEPARINUNFRC  --   --  0.40  --   CREATININE 0.72  --   --   --   TROPONINIHS 92* 777*  --   --      Estimated Creatinine Clearance: 116.5 mL/min (by C-G formula based on SCr of 0.72 mg/dL).   Medical History: Past Medical History:  Diagnosis Date   Collagen vascular disease (HCC)    HX: RT blood clot in leg, PE   Diabetes mellitus without complication (HCC)    Pulmonary embolism (HCC)     Medications:  PTA:  Apixaban 5 mg BID  Assessment: Pt is 35 yo female presenting to ED via EMS with radiating chest pain.  Pt found to have elevated Troponin I, trending up and EKG = ST with prolong QT.  Baseline labs: INR 1.1, HL 0.4, Hgb 15, Plts 297  Date Time HL aPTT Rate/comment 8/12 1300 -- 37 1150 un/hr, subtherapeutic   Goal of Therapy:  Heparin level 0.3-0.7 units/ml Monitor platelets by anticoagulation protocol: Yes Goal aPTT: 66-102   Plan:  aPTT 37, subtherapeutic  Bolus 2500 units x 1 and increase heparin infusion to 1450 units/hr Will follow aPTT until correlation with HL Will order aPTT 6 hr after rate change Daily HL and CBC while on heparin.  10/12, PharmD 10/28/2020 1:39 PM

## 2020-10-28 NOTE — Assessment & Plan Note (Signed)
Pt continues to smoke. Nicotine patch ordered.

## 2020-10-28 NOTE — ED Notes (Signed)
Date and time results received: 10/28/20 2:06  Test: Troponin Critical Value: 777  Name of Provider Notified: Dr. Dolores Frame

## 2020-10-28 NOTE — CV Procedure (Signed)
Brief post cath note Indication non-STEMI troponin 700 Results LV function normal 60% Coronaries Left main large free of disease LAD large proximal 50% nonobstructive lesion Circumflex large minor irregularities RCA large normal  Intervention Deferred not indicated  Patient had mild rash on arms after procedure possibly related to Benadryl versus contrast Patient was given Solu-Medrol and Pepcid Imminently stable Consider early discharge follow-up with cardiology 1 to 2-weeks

## 2020-10-28 NOTE — Assessment & Plan Note (Signed)
Holding Eliquis while on heparin gtts. Last dose of eliquis yesterday evening.

## 2020-10-28 NOTE — Assessment & Plan Note (Signed)
Pt has been [facility] in the past. Workup negative for her hypercoagulable state.

## 2020-10-28 NOTE — Assessment & Plan Note (Signed)
Hold metformin. Check A1c. Add SSI. 

## 2020-10-28 NOTE — Subjective & Objective (Signed)
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 No exertional component to jaw pain. Pt states she was sitting in her home watching TV when jaw pain started. Nothing makes it worse or better. Started having chest pain last night around 8 pm. +N/V. Vomiting x 3. + SOB.  Worsening chest pain this AM, called EMS. Received NTG.  Pt states she does not take suboxone any longer. Has been off of it for at least 1 year.  Continues to smoke. No IV drugs.  In ER, EKG showed sinus tachycardia. Initial Troponin was 777.  Last dose of Eliquis yesterday evening around 8 pm. Pt denies missing any doses.  Started on IV heparin. Pt received ASA 325 mg with ambulance. Cardiology consulted. TR[facility] consulted for admission.

## 2020-10-28 NOTE — ED Notes (Signed)
Patient resting comfortably in triage. Repeat troponin to be drawn. Patient updated on potential wait for rooming. Warm blanket provided.

## 2020-10-28 NOTE — Progress Notes (Signed)
MD notified of critical troponin. NO new orders at this time. Cardiology following.

## 2020-10-28 NOTE — Assessment & Plan Note (Signed)
Continue PPI ?

## 2020-10-29 ENCOUNTER — Inpatient Hospital Stay
Admit: 2020-10-29 | Discharge: 2020-10-29 | Disposition: A | Discharge status: Home / Self Care | Payer: Medicaid Other | Attending: Internal Medicine | Admitting: Internal Medicine

## 2020-10-29 LAB — GLUCOSE, CAPILLARY
Glucose-Capillary: 235 mg/dL — ABNORMAL HIGH (ref 70–99)
Glucose-Capillary: 184 mg/dL — ABNORMAL HIGH (ref 70–99)

## 2020-10-29 LAB — COMPREHENSIVE METABOLIC PANEL
ALT: 20 U/L (ref 0–44)
AST: 25 U/L (ref 15–41)
Albumin: 3.7 g/dL (ref 3.5–5.0)
Alkaline Phosphatase: 55 U/L (ref 38–126)
Anion gap: 8 (ref 5–15)
BUN: 11 mg/dL (ref 6–20)
CO2: 23 mmol/L (ref 22–32)
Calcium: 9 mg/dL (ref 8.9–10.3)
Chloride: 102 mmol/L (ref 98–111)
Creatinine, Ser: 0.67 mg/dL (ref 0.44–1.00)
GFR, Estimated: >60 mL/min (ref >60)
Glucose, Bld: 278 mg/dL — ABNORMAL HIGH (ref 70–99)
Potassium: 4.2 mmol/L (ref 3.5–5.1)
Sodium: 133 mmol/L — ABNORMAL LOW (ref 135–145)
Total Bilirubin: 0.7 mg/dL (ref 0.3–1.2)
Total Protein: 7.2 g/dL (ref 6.5–8.1)

## 2020-10-29 LAB — ECHOCARDIOGRAM COMPLETE
AR max vel: 2.14 cm2
AV Area VTI: 2.03 cm2
AV Area mean vel: 2.06 cm2
AV Mean grad: 2 mmHg
AV Peak grad: 4.2 mmHg
Ao pk vel: 1.02 m/s
Area-P 1/2: 3.38 cm2
S' Lateral: 4.42 cm

## 2020-10-29 LAB — HEPARIN LEVEL (UNFRACTIONATED): Heparin Unfractionated: <0.1 IU/mL — ABNORMAL LOW (ref 0.30–0.70)

## 2020-10-29 LAB — CBC WITH DIFFERENTIAL/PLATELET
Abs Immature Granulocytes: 0.08 10*3/uL — ABNORMAL HIGH (ref 0.00–0.07)
Basophils Absolute: 0 10*3/uL (ref 0.0–0.1)
Basophils Relative: 0 %
Eosinophils Absolute: 0 10*3/uL (ref 0.0–0.5)
Eosinophils Relative: 0 %
HCT: 42.7 % (ref 36.0–46.0)
Hemoglobin: 14.1 g/dL (ref 12.0–15.0)
Immature Granulocytes: 1 %
Lymphocytes Relative: 14 %
Lymphs Abs: 1.4 10*3/uL (ref 0.7–4.0)
MCH: 28.5 pg (ref 26.0–34.0)
MCHC: 33 g/dL (ref 30.0–36.0)
MCV: 86.3 fL (ref 80.0–100.0)
Monocytes Absolute: 0.2 10*3/uL (ref 0.1–1.0)
Monocytes Relative: 2 %
Neutro Abs: 8 10*3/uL — ABNORMAL HIGH (ref 1.7–7.7)
Neutrophils Relative %: 83 %
Platelets: 243 10*3/uL (ref 150–400)
RBC: 4.95 MIL/uL (ref 3.87–5.11)
RDW: 13.3 % (ref 11.5–15.5)
WBC: 9.7 10*3/uL (ref 4.0–10.5)
nRBC: 0 % (ref 0.0–0.2)

## 2020-10-29 LAB — MAGNESIUM: Magnesium: 2 mg/dL (ref 1.7–2.4)

## 2020-10-29 LAB — HIV ANTIBODY (ROUTINE TESTING W REFLEX): HIV Screen 4th Generation wRfx: NONREACTIVE

## 2020-10-29 MED ORDER — METFORMIN HCL 500 MG PO TABS: 500.0000 mg | ORAL_TABLET | Freq: Two times a day (BID) | ORAL | Status: DC

## 2020-10-29 MED ORDER — ATORVASTATIN CALCIUM 80 MG PO TABS: 80.0000 mg | ORAL_TABLET | Freq: Every day | ORAL | 2 refills | Status: DC

## 2020-10-29 MED ORDER — ATORVASTATIN CALCIUM 80 MG PO TABS: 80.0000 mg | ORAL_TABLET | Freq: Every day | ORAL | 2 refills | Status: AC

## 2020-10-29 MED ORDER — CLOPIDOGREL BISULFATE 75 MG PO TABS: 75.0000 mg | ORAL_TABLET | Freq: Every day | ORAL | 5 refills | Status: DC

## 2020-10-29 MED ORDER — CLOPIDOGREL BISULFATE 75 MG PO TABS: 75.0000 mg | ORAL_TABLET | Freq: Every day | ORAL | 5 refills | Status: AC

## 2020-10-29 NOTE — Plan of Care (Signed)
  Problem: Education: Goal: Knowledge of General Education information will improve Description: Including pain rating scale, medication(s)/side effects and non-pharmacologic comfort measures 10/29/2020 1307 by Ansel Bong, RN Outcome: Progressing 10/29/2020 1307 by Ansel Bong, RN Outcome: Progressing   Problem: Health Behavior/Discharge Planning: Goal: Ability to manage health-related needs will improve 10/29/2020 1307 by Ansel Bong, RN Outcome: Progressing 10/29/2020 1307 by Ansel Bong, RN Outcome: Progressing   Problem: Clinical Measurements: Goal: Ability to maintain clinical measurements within normal limits will improve 10/29/2020 1307 by Ansel Bong, RN Outcome: Progressing 10/29/2020 1307 by Ansel Bong, RN Outcome: Progressing Goal: Will remain free from infection 10/29/2020 1307 by Ansel Bong, RN Outcome: Progressing 10/29/2020 1307 by Ansel Bong, RN Outcome: Progressing Goal: Diagnostic test results will improve 10/29/2020 1307 by Ansel Bong, RN Outcome: Progressing 10/29/2020 1307 by Ansel Bong, RN Outcome: Progressing Goal: Respiratory complications will improve 10/29/2020 1307 by Ansel Bong, RN Outcome: Progressing 10/29/2020 1307 by Ansel Bong, RN Outcome: Progressing Goal: Cardiovascular complication will be avoided 10/29/2020 1307 by Ansel Bong, RN Outcome: Progressing 10/29/2020 1307 by Ansel Bong, RN Outcome: Progressing   Problem: Activity: Goal: Risk for activity intolerance will decrease 10/29/2020 1307 by Ansel Bong, RN Outcome: Progressing 10/29/2020 1307 by Ansel Bong, RN Outcome: Progressing   Problem: Nutrition: Goal: Adequate nutrition will be maintained 10/29/2020 1307 by Ansel Bong, RN Outcome: Progressing 10/29/2020 1307 by Ansel Bong, RN Outcome: Progressing   Problem: Coping: Goal: Level of anxiety will decrease 10/29/2020 1307 by Ansel Bong, RN Outcome:  Progressing 10/29/2020 1307 by Ansel Bong, RN Outcome: Progressing   Problem: Elimination: Goal: Will not experience complications related to bowel motility 10/29/2020 1307 by Ansel Bong, RN Outcome: Progressing 10/29/2020 1307 by Ansel Bong, RN Outcome: Progressing Goal: Will not experience complications related to urinary retention 10/29/2020 1307 by Ansel Bong, RN Outcome: Progressing 10/29/2020 1307 by Ansel Bong, RN Outcome: Progressing   Problem: Pain Managment: Goal: General experience of comfort will improve 10/29/2020 1307 by Ansel Bong, RN Outcome: Progressing 10/29/2020 1307 by Ansel Bong, RN Outcome: Progressing   Problem: Safety: Goal: Ability to remain free from injury will improve 10/29/2020 1307 by Ansel Bong, RN Outcome: Progressing 10/29/2020 1307 by Ansel Bong, RN Outcome: Progressing   Problem: Skin Integrity: Goal: Risk for impaired skin integrity will decrease 10/29/2020 1307 by Ansel Bong, RN Outcome: Progressing 10/29/2020 1307 by Ansel Bong, RN Outcome: Progressing

## 2020-10-29 NOTE — Progress Notes (Signed)
[facility] Cardiology    SUBJECTIVE: Patient feels better no chest pain no shortness of breath no fever status post cardiac cath with no complication feels reasonably well   Vitals:   10/28/20 1900 10/28/20 1924 10/28/20 2001 10/29/20 0518  BP: (!) 99/58 (!) 97/55 (!) 103/49 112/66  Pulse: 71 78 70 60  Resp: 15 14  16   Temp:   98.1 F (36.7 C) 98.2 F (36.8 C)  TempSrc:      SpO2: 93% 95% 96% 97%  Weight:      Height:         Intake/Output Summary (Last 24 hours) at 10/29/2020 10/31/2020 Last data filed at 10/28/2020 2239 Gross per 24 hour  Intake 480 ml  Output --  Net 480 ml      PHYSICAL EXAM  General: Well developed, well nourished, in no acute distress HEENT:  Normocephalic and atramatic Neck:  No JVD.  Lungs: Clear bilaterally to auscultation and percussion. Heart: HRRR . Normal S1 and S2 without gallops or murmurs.  Abdomen: Bowel sounds are positive, abdomen soft and non-tender  Msk:  Back normal, normal gait. Normal strength and tone for age. Extremities: No clubbing, cyanosis or edema.   Neuro: Alert and oriented X 3. Psych:  Good affect, responds appropriately   LABS: Basic Metabolic Panel: Recent Labs    10/27/20 2243  NA 133*  K 4.0  CL 102  CO2 26  GLUCOSE 199*  BUN 11  CREATININE 0.72  CALCIUM 8.8*   Liver Function Tests: No results for input(s): AST, ALT, ALKPHOS, BILITOT, PROT, ALBUMIN in the last 72 hours. No results for input(s): LIPASE, AMYLASE in the last 72 hours. CBC: Recent Labs    10/27/20 2243  WBC 20.6*  20.7*  NEUTROABS 18.1*  HGB 15.0  15.1*  HCT 44.9  45.1  MCV 87.0  87.1  PLT 297  301   Cardiac Enzymes: No results for input(s): CKTOTAL, CKMB, CKMBINDEX, TROPONINI in the last 72 hours. BNP: Invalid input(s): POCBNP D-Dimer: No results for input(s): DDIMER in the last 72 hours. Hemoglobin A1C: Recent Labs    10/28/20 1300  HGBA1C 8.1*   Fasting Lipid Panel: Recent Labs    10/28/20 1300  CHOL 215*  HDL 25*   LDLCALC 133*  TRIG 284*  CHOLHDL 8.6   Thyroid Function Tests: No results for input(s): TSH, T4TOTAL, T3FREE, THYROIDAB in the last 72 hours.  Invalid input(s): FREET3 Anemia Panel: No results for input(s): VITAMINB12, FOLATE, FERRITIN, TIBC, IRON, RETICCTPCT in the last 72 hours.  DG Chest 2 View  Result Date: 10/27/2020 CLINICAL DATA:  Chest pain EXAM: CHEST - 2 VIEW COMPARISON:  12/28/2019 FINDINGS: Small right pleural effusion or pleural thickening with atelectasis or scar at the right base. No consolidation. Normal cardiac size. No pneumothorax IMPRESSION: Small right pleural effusion or thickening with probable scarring at the right base Electronically Signed   By: 02/27/2020 M.D.   On: 10/27/2020 23:12   CT Angio Chest PE W/Cm &/Or Wo Cm  Result Date: 10/28/2020 CLINICAL DATA:  Chest/left arm pain EXAM: CT ANGIOGRAPHY CHEST WITH CONTRAST TECHNIQUE: Multidetector CT imaging of the chest was performed using the standard protocol during bolus administration of intravenous contrast. Multiplanar CT image reconstructions and MIPs were obtained to evaluate the vascular anatomy. CONTRAST:  12/28/2020 OMNIPAQUE IOHEXOL 350 MG/ML SOLN COMPARISON:  CT chest dated 12/28/2019 FINDINGS: Cardiovascular: Satisfactory opacification the bilateral pulmonary arteries to lobar level. No evidence of pulmonary embolism. Study is not tailored for  evaluation of the thoracic aorta. No evidence of thoracic aortic aneurysm. Mild atherosclerotic calcifications of the aortic arch. Heart is normal in size.  No pericardial effusion. Mediastinum/Nodes: No suspicious mediastinal lymphadenopathy. Visualized thyroid is grossly unremarkable. Lungs/Pleura: Mild right basilar atelectasis. Lungs are otherwise clear. No suspicious pulmonary nodules. No focal consolidation. No pleural effusion or pneumothorax. Upper Abdomen: Visualized upper abdomen grossly unremarkable. Musculoskeletal: Visualized osseous structures are within  normal limits. Review of the MIP images confirms the above findings. IMPRESSION: No evidence of pulmonary embolism. Negative CT chest. Aortic Atherosclerosis (ICD10-I70.0). Electronically Signed   By: Charline Bills M.D.   On: 10/28/2020 03:39     Echo preserved overall left ventricular function EF of 60%  TELEMETRY: Normal sinus rhythm rate of 55:  ASSESSMENT AND PLAN:  Principal Problem:   NSTEMI (non-ST elevated myocardial infarction) (HCC) Active Problems:   Hx pulmonary embolism   Positive ANA (antinuclear antibody)   Type 2 diabetes with complication (HCC)   Tobacco abuse   GERD (gastroesophageal reflux disease) Elevated white count Status post cardiac cath Hyperlipidemia  Plan Increase activity ambulate in halls Continue aspirin Plavix for 6 months then can discontinue Plavix Status post non-STEMI peak troponins of 5000 pain-free continue aspirin Plavix therapy Statin therapy Lipitor long-term Elevated white count unclear etiology no clear signs of infection Beta-blocker ARB statin Diabetes management and control Advised to refrain from tobacco abuse Recommend weight loss exercise portion control Continue PPI therapy for reflux omeprazole or Protonix Have the patient follow-up cardiology 1 to 2 weeks   Jessica Pea, MD,  10/29/2020 7:24 AM

## 2020-10-29 NOTE — Discharge Summary (Signed)
Physician Discharge Summary  [name] PHX:[id number] DOB: 1985-06-09 DOA: 10/28/2020  PCP: Jessica Leatherwood, FNP  Admit date: 10/28/2020 Discharge date: 10/29/2020  Admitted From: Home Discharge disposition: Home   Code Status: Full Code   Discharge Diagnosis:   Principal Problem:   NSTEMI (non-ST elevated myocardial infarction) (HCC) Active Problems:   Hx pulmonary embolism   Positive ANA (antinuclear antibody)   Type 2 diabetes with complication (HCC)   Tobacco abuse   GERD (gastroesophageal reflux disease)   Brief narrative: [name] is a 35 y.o. 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 Patient presented to the ED with 3 days of jaw pain and an episode of chest pain on evening of 8/11 associated with nausea and vomiting x3.  In the ED, patient was hemodynamically stable Labs showed initial troponin of 92 which subsequently got elevated to 777 and subsequently more than 5000 Cardiology consulted.  Started on IV heparin drip, aspirin Admitted to hospitalist service See below for details  Subjective: Patient was seen and examined this morning.  Pleasant young Caucasian female.  Not in distress.  Complains of mild pain at the right wrist radial artery cath access site yesterday.  No chest pain.  Feels ready to go home. Hemodynamically stable overnight Labs with blood sugar level elevated 278.  Hospital course NSTEMI (non-ST elevated myocardial infarction) -Presented with 3 days of jaw pain culminating to an episode of chest pain  -Troponin elevated to a peak of 5600  -Cardiology consult appreciated. -8/12, underwent cardiac cath.  Per cath report, LV function 60%, 50% nonobstructive lesion in the proximal LAD with minor irregularities in circumflex, otherwise normal.  She did not require any intervention. -Patient takes Eliquis because of history of pulmonary embolism in  the past.  -Discussed with cardiologist Dr. Juliann Pares.  He recommends patient to add Plavix for 6 months.  -No aspirin.    Uncontrolled type 2 diabetes mellitus Hyperglycemia -A1c 8.100/12 -Home meds include metformin 500 mg twice daily, -Currently on sliding scale soon with Accu-Cheks -Blood sugar level running elevated probably because of importance of Solu-Medrol given yesterday for skin rash -Can resume metformin at home. Recent Labs  Lab 10/28/20 1247 10/28/20 1734 10/28/20 2002 10/29/20 0841 10/29/20 1205  GLUCAP 140* 115* 261* 235* 184*   Post cath skin rash -Per report, post cardiac cath, patient developed red and white splotchy rash on both arms which were mildly itchy and tender.  She was given Solu-Medrol and Benadryl after which the rashes resolved.   Hx pulmonary embolism -Eliquis to continue  Hyperlipidemia -8/12, HDL low at 21, LDL 133, triglycerides 284 -Started on Lipitor 80 mg daily.   Positive ANA (antinuclear antibody) Reportedly seen by [facility] rheumatology in the past and had negative work-up for hypercoagulable state.   Chronic everyday smoker Pt continues to smoke. Nicotine patch ordered.   GERD (gastroesophageal reflux disease) Continue PPI.   Allergies as of 10/29/2020       Reactions   Penicillins Other (See Comments)   Reaction:  Unknown   Xarelto [rivaroxaban] Other (See Comments)   Reaction: Pt said it "made me feel like I was drunk."   Ibuprofen Rash        Medication List     STOP taking these medications    albuterol 108 (90 Base) MCG/ACT inhaler Commonly known as: VENTOLIN HFA   Buprenorphine HCl-Naloxone HCl 8-2 MG Film  dicyclomine 10 MG capsule Commonly known as: Bentyl   gabapentin 300 MG capsule Commonly known as: NEURONTIN   mupirocin ointment 2 % Commonly known as: Bactroban   nitrofurantoin (macrocrystal-monohydrate) 100 MG capsule Commonly known as: MACROBID   nystatin ointment Commonly known as:  MYCOSTATIN   ondansetron 4 MG tablet Commonly known as: Zofran   phenazopyridine 200 MG tablet Commonly known as: PYRIDIUM   predniSONE 50 MG tablet Commonly known as: DELTASONE       TAKE these medications    apixaban 5 MG Tabs tablet Commonly known as: ELIQUIS Take 5 mg by mouth 2 (two) times daily.   atorvastatin 80 MG tablet Commonly known as: LIPITOR Take 1 tablet (80 mg total) by mouth daily. Start taking on: October 30, 2020   clopidogrel 75 MG tablet Commonly known as: Plavix Take 1 tablet (75 mg total) by mouth daily.   metFORMIN 500 MG tablet Commonly known as: GLUCOPHAGE Take 500 mg by mouth 2 (two) times daily with a meal.   omeprazole 40 MG capsule Commonly known as: PRILOSEC Take 1 capsule (40 mg total) by mouth daily.        Discharge Instructions:  Diet Recommendation: Diabetic diet   Follow with Primary MD Jessica Leatherwood, FNP in 7 days   Get CBC/BMP checked in next visit within 1 week by PCP or SNF MD ( we routinely change or add medications that can affect your baseline labs and fluid status, therefore we recommend that you get the mentioned basic workup next visit with your PCP, your PCP may decide not to get them or add new tests based on their clinical decision)  On your next visit with your PCP, please Get Medicines reviewed and adjusted.  Please request your PCP  to go over all Hospital Tests and Procedure/Radiological results at the follow up, please get all Hospital records sent to your Prim MD by signing hospital release before you go home.  Activity: As tolerated with Full fall precautions use walker/cane & assistance as needed  For Heart failure patients - Check your Weight same time everyday, if you gain over 2 pounds, or you develop in leg swelling, experience more shortness of breath or chest pain, call your Primary MD immediately. Follow Cardiac Low Salt Diet and 1.5 lit/day fluid restriction.  If you have smoked or chewed  Tobacco in the last 2 yrs please stop smoking, stop any regular Alcohol  and or any Recreational drug use.  If you experience worsening of your admission symptoms, develop shortness of breath, life threatening emergency, suicidal or homicidal thoughts you must seek medical attention immediately by calling 911 or calling your MD immediately  if symptoms less severe.  You Must read complete instructions/literature along with all the possible adverse reactions/side effects for all the Medicines you take and that have been prescribed to you. Take any new Medicines after you have completely understood and accpet all the possible adverse reactions/side effects.   Do not drive, operate heavy machinery, perform activities at heights, swimming or participation in water activities or provide baby sitting services if your were admitted for syncope or siezures until you have seen by Primary MD or a Neurologist and advised to do so again.  Do not drive when taking Pain medications.  Do not take more than prescribed Pain, Sleep and Anxiety Medications  Wear Seat belts while driving.   Please note You were cared for by a hospitalist during your hospital stay. If you have any questions  about your discharge medications or the care you received while you were in the hospital after you are discharged, you can call the unit and asked to speak with the hospitalist on call if the hospitalist that took care of you is not available. Once you are discharged, your primary care physician will handle any further medical issues. Please note that NO REFILLS for any discharge medications will be authorized once you are discharged, as it is imperative that you return to your primary care physician (or establish a relationship with a primary care physician if you do not have one) for your aftercare needs so that they can reassess your need for medications and monitor your lab values.    Follow ups:    Follow-up Information      Jessica Leatherwood, FNP Follow up.   Specialty: Nurse Practitioner Contact information: [address] [place] [address] [phone]                 Wound care:     Discharge Exam:   Vitals:   10/28/20 2001 10/29/20 0518 10/29/20 0838 10/29/20 1204  BP: (!) 103/49 112/66 (!) 148/122 98/73  Pulse: 70 60 67 67  Resp:  16 18 16   Temp: 98.1 F (36.7 C) 98.2 F (36.8 C) 98 F (36.7 C) 97.7 F (36.5 C)  TempSrc:      SpO2: 96% 97% 98% 96%  Weight:      Height:        Body mass index is 38.27 kg/m.  General exam: Pleasant, young Caucasian female.  Not in distress Skin: No rashes, lesions or ulcers. HEENT: Atraumatic, normocephalic, no obvious bleeding Lungs: Clear to auscultation bilaterally CVS: Regular rate and rhythm, no murmur GI/Abd soft, nontender, nondistended, bowel sounds CNS: Alert, awake, oriented x3 Psychiatry: Mood appropriate Extremities: No pedal edema, no calf tenderness  Time coordinating discharge: 35 minutes   The results of significant diagnostics from this hospitalization (including imaging, microbiology, ancillary and laboratory) are listed below for reference.    Procedures and Diagnostic Studies:   No results found.   Labs:   Basic Metabolic Panel: Recent Labs  Lab 10/27/20 2243 10/29/20 0626  NA 133* 133*  K 4.0 4.2  CL 102 102  CO2 26 23  GLUCOSE 199* 278*  BUN 11 11  CREATININE 0.72 0.67  CALCIUM 8.8* 9.0  MG  --  2.0   GFR Estimated Creatinine Clearance: 116.5 mL/min (by C-G formula based on SCr of 0.67 mg/dL). Liver Function Tests: Recent Labs  Lab 10/29/20 0626  AST 25  ALT 20  ALKPHOS 55  BILITOT 0.7  PROT 7.2  ALBUMIN 3.7   No results for input(s): LIPASE, AMYLASE in the last 168 hours. No results for input(s): AMMONIA in the last 168 hours. Coagulation profile Recent Labs  Lab 10/27/20 2243  INR 1.1    CBC: Recent Labs  Lab 10/27/20 2243 10/29/20 0626  WBC 20.6*  20.7* 9.7  NEUTROABS  18.1* 8.0*  HGB 15.0  15.1* 14.1  HCT 44.9  45.1 42.7  MCV 87.0  87.1 86.3  PLT 297  301 243   Cardiac Enzymes: No results for input(s): CKTOTAL, CKMB, CKMBINDEX, TROPONINI in the last 168 hours. BNP: Invalid input(s): POCBNP CBG: Recent Labs  Lab 10/28/20 1247 10/28/20 1734 10/28/20 2002 10/29/20 0841 10/29/20 1205  GLUCAP 140* 115* 261* 235* 184*   D-Dimer No results for input(s): DDIMER in the last 72 hours. Hgb A1c Recent Labs  10/28/20 1300  HGBA1C 8.1*   Lipid Profile Recent Labs    10/28/20 1300  CHOL 215*  HDL 25*  LDLCALC 133*  TRIG 284*  CHOLHDL 8.6   Thyroid function studies No results for input(s): TSH, T4TOTAL, T3FREE, THYROIDAB in the last 72 hours.  Invalid input(s): FREET3 Anemia work up No results for input(s): VITAMINB12, FOLATE, FERRITIN, TIBC, IRON, RETICCTPCT in the last 72 hours. Microbiology Recent Results (from the past 240 hour(s))  Resp Panel by RT-PCR (Flu A&B, Covid) Nasopharyngeal Swab     Status: None   Collection Time: 10/28/20  2:26 AM   Specimen: Nasopharyngeal Swab; Nasopharyngeal(NP) swabs in vial transport medium  Result Value Ref Range Status   SARS Coronavirus 2 by RT PCR NEGATIVE NEGATIVE Final    Comment: (NOTE) SARS-CoV-2 target nucleic acids are NOT DETECTED.  The SARS-CoV-2 RNA is generally detectable in upper respiratory specimens during the acute phase of infection. The lowest concentration of SARS-CoV-2 viral copies this assay can detect is 138 copies/mL. A negative result does not preclude SARS-Cov-2 infection and should not be used as the sole basis for treatment or other patient management decisions. A negative result may occur with  improper specimen collection/handling, submission of specimen other than nasopharyngeal swab, presence of viral mutation(s) within the areas targeted by this assay, and inadequate number of viral copies(<138 copies/mL). A negative result must be combined with clinical  observations, patient history, and epidemiological information. The expected result is Negative.  Fact Sheet for Patients:  BloggerCourse.comhttps://www.fda.gov/media/152166/download  Fact Sheet for Healthcare Providers:  SeriousBroker.ithttps://www.fda.gov/media/152162/download  This test is no t yet approved or cleared by the [place] FDA and  has been authorized for detection and/or diagnosis of SARS-CoV-2 by FDA under an Emergency Use Authorization (EUA). This EUA will remain  in effect (meaning this test can be used) for the duration of the COVID-19 declaration under Section 564(b)(1) of the Act, 21 U.S.C.section 360bbb-3(b)(1), unless the authorization is terminated  or revoked sooner.       Influenza A by PCR NEGATIVE NEGATIVE Final   Influenza B by PCR NEGATIVE NEGATIVE Final    Comment: (NOTE) The Xpert Xpress SARS-CoV-2/FLU/RSV plus assay is intended as an aid in the diagnosis of influenza from Nasopharyngeal swab specimens and should not be used as a sole basis for treatment. Nasal washings and aspirates are unacceptable for Xpert Xpress SARS-CoV-2/FLU/RSV testing.  Fact Sheet for Patients: BloggerCourse.comhttps://www.fda.gov/media/152166/download  Fact Sheet for Healthcare Providers: SeriousBroker.ithttps://www.fda.gov/media/152162/download  This test is not yet approved or cleared by the [place] FDA and has been authorized for detection and/or diagnosis of SARS-CoV-2 by FDA under an Emergency Use Authorization (EUA). This EUA will remain in effect (meaning this test can be used) for the duration of the COVID-19 declaration under Section 564(b)(1) of the Act, 21 U.S.C. section 360bbb-3(b)(1), unless the authorization is terminated or revoked.  Performed at [facility], [address] [place], [place] [id number]     Signed: Lorin GlassBinaya Malu Pellegrini  [facility] 10/29/2020, 1:34 PM

## 2020-10-29 NOTE — Progress Notes (Signed)
This RN provided discharge instructions and teaching to the patient. The patient verbalized and demonstrated understanding of the provided instructions. All outstanding questions resolved. R FA PIV removed. Cannula intact. Pt tolerated fairly well. All belongings packed and in tow. Volunteer services to transport patient to private vehicle via wheelchair at time of departure.

## 2020-10-30 LAB — CARDIAC CATHETERIZATION: Cath EF Quantitative: 60 %

## 2020-10-31 ENCOUNTER — Encounter: Payer: Self-pay | Admitting: Internal Medicine

## 2020-10-31 LAB — GLUCOSE, CAPILLARY: Glucose-Capillary: 129 mg/dL — ABNORMAL HIGH (ref 70–99)

## 2020-12-03 ENCOUNTER — Emergency Department: Payer: Medicaid Other

## 2020-12-03 ENCOUNTER — Observation Stay
Admission: EM | Admit: 2020-12-03 | Discharge: 2020-12-04 | DRG: 947 | Discharge status: Against Medical Advice | Payer: Medicaid Other | Attending: Internal Medicine | Admitting: Internal Medicine

## 2020-12-03 ENCOUNTER — Other Ambulatory Visit: Payer: Self-pay

## 2020-12-03 DIAGNOSIS — Z888 Allergy status to other drugs, medicaments and biological substances status: Secondary | ICD-10-CM

## 2020-12-03 DIAGNOSIS — Z880 Allergy status to penicillin: Secondary | ICD-10-CM

## 2020-12-03 DIAGNOSIS — R079 Chest pain, unspecified: Secondary | ICD-10-CM | POA: Diagnosis present

## 2020-12-03 DIAGNOSIS — Z20822 Contact with and (suspected) exposure to covid-19: Secondary | ICD-10-CM | POA: Diagnosis present

## 2020-12-03 DIAGNOSIS — E1169 Type 2 diabetes mellitus with other specified complication: Secondary | ICD-10-CM

## 2020-12-03 DIAGNOSIS — Z8249 Family history of ischemic heart disease and other diseases of the circulatory system: Secondary | ICD-10-CM

## 2020-12-03 DIAGNOSIS — Z720 Tobacco use: Secondary | ICD-10-CM | POA: Diagnosis not present

## 2020-12-03 DIAGNOSIS — U071 COVID-19: Principal | ICD-10-CM | POA: Insufficient documentation

## 2020-12-03 DIAGNOSIS — Z79899 Other long term (current) drug therapy: Secondary | ICD-10-CM | POA: Diagnosis not present

## 2020-12-03 DIAGNOSIS — Z86718 Personal history of other venous thrombosis and embolism: Secondary | ICD-10-CM

## 2020-12-03 DIAGNOSIS — F17210 Nicotine dependence, cigarettes, uncomplicated: Secondary | ICD-10-CM | POA: Insufficient documentation

## 2020-12-03 DIAGNOSIS — K219 Gastro-esophageal reflux disease without esophagitis: Secondary | ICD-10-CM | POA: Insufficient documentation

## 2020-12-03 DIAGNOSIS — D6859 Other primary thrombophilia: Secondary | ICD-10-CM | POA: Diagnosis present

## 2020-12-03 DIAGNOSIS — R0789 Other chest pain: Secondary | ICD-10-CM | POA: Diagnosis present

## 2020-12-03 DIAGNOSIS — Z825 Family history of asthma and other chronic lower respiratory diseases: Secondary | ICD-10-CM

## 2020-12-03 DIAGNOSIS — E785 Hyperlipidemia, unspecified: Secondary | ICD-10-CM | POA: Diagnosis present

## 2020-12-03 DIAGNOSIS — E118 Type 2 diabetes mellitus with unspecified complications: Secondary | ICD-10-CM | POA: Diagnosis present

## 2020-12-03 DIAGNOSIS — E1165 Type 2 diabetes mellitus with hyperglycemia: Secondary | ICD-10-CM | POA: Insufficient documentation

## 2020-12-03 DIAGNOSIS — R112 Nausea with vomiting, unspecified: Secondary | ICD-10-CM | POA: Diagnosis present

## 2020-12-03 DIAGNOSIS — F411 Generalized anxiety disorder: Secondary | ICD-10-CM | POA: Insufficient documentation

## 2020-12-03 DIAGNOSIS — M940 Chondrocostal junction syndrome [Tietze]: Secondary | ICD-10-CM | POA: Diagnosis present

## 2020-12-03 DIAGNOSIS — Z7984 Long term (current) use of oral hypoglycemic drugs: Secondary | ICD-10-CM

## 2020-12-03 DIAGNOSIS — R778 Other specified abnormalities of plasma proteins: Secondary | ICD-10-CM | POA: Diagnosis present

## 2020-12-03 DIAGNOSIS — Z5329 Procedure and treatment not carried out because of patient's decision for other reasons: Secondary | ICD-10-CM | POA: Diagnosis present

## 2020-12-03 DIAGNOSIS — R000 Tachycardia, unspecified: Secondary | ICD-10-CM | POA: Diagnosis present

## 2020-12-03 DIAGNOSIS — R6884 Jaw pain: Secondary | ICD-10-CM | POA: Diagnosis present

## 2020-12-03 DIAGNOSIS — Z7902 Long term (current) use of antithrombotics/antiplatelets: Secondary | ICD-10-CM

## 2020-12-03 DIAGNOSIS — Z823 Family history of stroke: Secondary | ICD-10-CM

## 2020-12-03 DIAGNOSIS — Z833 Family history of diabetes mellitus: Secondary | ICD-10-CM

## 2020-12-03 DIAGNOSIS — Z7901 Long term (current) use of anticoagulants: Secondary | ICD-10-CM

## 2020-12-03 DIAGNOSIS — Z90710 Acquired absence of both cervix and uterus: Secondary | ICD-10-CM

## 2020-12-03 DIAGNOSIS — Z86711 Personal history of pulmonary embolism: Secondary | ICD-10-CM | POA: Diagnosis present

## 2020-12-03 HISTORY — DX: Acute embolism and thrombosis of unspecified deep veins of unspecified lower extremity: I82.409

## 2020-12-03 LAB — CBC
HCT: 45.4 % (ref 36.0–46.0)
Hemoglobin: 15 g/dL (ref 12.0–15.0)
MCH: 29.1 pg (ref 26.0–34.0)
MCHC: 33 g/dL (ref 30.0–36.0)
MCV: 88.2 fL (ref 80.0–100.0)
Platelets: 211 10*3/uL (ref 150–400)
RBC: 5.15 MIL/uL — ABNORMAL HIGH (ref 3.87–5.11)
RDW: 14.2 % (ref 11.5–15.5)
WBC: 10.1 10*3/uL (ref 4.0–10.5)
nRBC: 0 % (ref 0.0–0.2)

## 2020-12-03 LAB — BASIC METABOLIC PANEL
Anion gap: 11 (ref 5–15)
BUN: 9 mg/dL (ref 6–20)
CO2: 23 mmol/L (ref 22–32)
Calcium: 9 mg/dL (ref 8.9–10.3)
Chloride: 98 mmol/L (ref 98–111)
Creatinine, Ser: 0.61 mg/dL (ref 0.44–1.00)
GFR, Estimated: >60 mL/min (ref >60)
Glucose, Bld: 289 mg/dL — ABNORMAL HIGH (ref 70–99)
Potassium: 4.2 mmol/L (ref 3.5–5.1)
Sodium: 132 mmol/L — ABNORMAL LOW (ref 135–145)

## 2020-12-03 LAB — HEPATIC FUNCTION PANEL
ALT: 15 U/L (ref 0–44)
AST: 24 U/L (ref 15–41)
Albumin: 3.8 g/dL (ref 3.5–5.0)
Alkaline Phosphatase: 67 U/L (ref 38–126)
Bilirubin, Direct: 0.2 mg/dL (ref 0.0–0.2)
Indirect Bilirubin: 0.5 mg/dL (ref 0.3–0.9)
Total Bilirubin: 0.7 mg/dL (ref 0.3–1.2)
Total Protein: 7.5 g/dL (ref 6.5–8.1)

## 2020-12-03 LAB — URINE DRUG SCREEN, QUALITATIVE (ARMC ONLY)
Amphetamines, Ur Screen: NOT DETECTED
Barbiturates, Ur Screen: NOT DETECTED
Benzodiazepine, Ur Scrn: POSITIVE — AB
Cannabinoid 50 Ng, Ur ~~LOC~~: NOT DETECTED
Cocaine Metabolite,Ur ~~LOC~~: NOT DETECTED
MDMA (Ecstasy)Ur Screen: NOT DETECTED
Methadone Scn, Ur: NOT DETECTED
Opiate, Ur Screen: POSITIVE — AB
Phencyclidine (PCP) Ur S: NOT DETECTED
Tricyclic, Ur Screen: NOT DETECTED

## 2020-12-03 LAB — RESP PANEL BY RT-PCR (FLU A&B, COVID) ARPGX2
Influenza A by PCR: NEGATIVE
Influenza B by PCR: NEGATIVE
SARS Coronavirus 2 by RT PCR: POSITIVE — AB

## 2020-12-03 LAB — PROTIME-INR
INR: 1 (ref 0.8–1.2)
Prothrombin Time: 13.6 seconds (ref 11.4–15.2)

## 2020-12-03 LAB — TROPONIN I (HIGH SENSITIVITY)
Troponin I (High Sensitivity): 910 ng/L (ref <18)
Troponin I (High Sensitivity): 1211 ng/L (ref <18)

## 2020-12-03 LAB — SEDIMENTATION RATE: Sed Rate: 17 mm/hr (ref 0–20)

## 2020-12-03 LAB — TSH: TSH: 0.487 u[IU]/mL (ref 0.350–4.500)

## 2020-12-03 LAB — MAGNESIUM: Magnesium: 2 mg/dL (ref 1.7–2.4)

## 2020-12-03 LAB — LIPASE, BLOOD: Lipase: 25 U/L (ref 11–51)

## 2020-12-03 MED ORDER — ONDANSETRON HCL 4 MG/2ML IJ SOLN
4.0000 mg | Freq: Once | INTRAMUSCULAR | Status: AC
  Administered 2020-12-03: 4 mg via INTRAVENOUS
  Filled 2020-12-03: qty 2

## 2020-12-03 MED ORDER — ACETAMINOPHEN 650 MG RE SUPP: 650.0000 mg | Freq: Four times a day (QID) | RECTAL | Status: DC | PRN

## 2020-12-03 MED ORDER — HYDROMORPHONE HCL 1 MG/ML IJ SOLN
0.5000 mg | INTRAMUSCULAR | Status: DC | PRN
  Administered 2020-12-03 – 2020-12-04 (×5): 0.5 mg via INTRAVENOUS
  Filled 2020-12-03 (×5): qty 1

## 2020-12-03 MED ORDER — ACETAMINOPHEN 325 MG PO TABS: 650.0000 mg | ORAL_TABLET | Freq: Four times a day (QID) | ORAL | Status: DC | PRN

## 2020-12-03 MED ORDER — PANTOPRAZOLE SODIUM 40 MG IV SOLR
40.0000 mg | INTRAVENOUS | Status: DC
  Administered 2020-12-03: 40 mg via INTRAVENOUS
  Filled 2020-12-03: qty 40

## 2020-12-03 MED ORDER — NALOXONE HCL 2 MG/2ML IJ SOSY: 0.4000 mg | PREFILLED_SYRINGE | INTRAMUSCULAR | Status: DC | PRN

## 2020-12-03 MED ORDER — ONDANSETRON HCL 4 MG/2ML IJ SOLN: 4.0000 mg | Freq: Four times a day (QID) | INTRAMUSCULAR | Status: DC | PRN

## 2020-12-03 MED ORDER — ALBUTEROL SULFATE HFA 108 (90 BASE) MCG/ACT IN AERS
1.0000 | INHALATION_SPRAY | RESPIRATORY_TRACT | Status: DC | PRN
  Filled 2020-12-03: qty 6.7

## 2020-12-03 MED ORDER — METOPROLOL TARTRATE 25 MG PO TABS
12.5000 mg | ORAL_TABLET | Freq: Once | ORAL | Status: DC
  Filled 2020-12-03: qty 1

## 2020-12-03 MED ORDER — LORAZEPAM 2 MG/ML IJ SOLN: 0.5000 mg | INTRAMUSCULAR | Status: DC | PRN

## 2020-12-03 MED ORDER — APIXABAN 5 MG PO TABS
5.0000 mg | ORAL_TABLET | Freq: Two times a day (BID) | ORAL | Status: DC
  Administered 2020-12-03: 5 mg via ORAL
  Filled 2020-12-03: qty 1

## 2020-12-03 MED ORDER — MORPHINE SULFATE (PF) 4 MG/ML IV SOLN
4.0000 mg | INTRAVENOUS | Status: DC | PRN
Start: 2020-12-03 — End: 2020-12-03
  Administered 2020-12-03: 4 mg via INTRAVENOUS
  Filled 2020-12-03: qty 1

## 2020-12-03 MED ORDER — LIDOCAINE VISCOUS HCL 2 % MT SOLN
15.0000 mL | Freq: Once | OROMUCOSAL | Status: AC
  Administered 2020-12-03: 15 mL via ORAL
  Filled 2020-12-03: qty 15

## 2020-12-03 MED ORDER — INSULIN ASPART 100 UNIT/ML IJ SOLN
0.0000 [IU] | Freq: Three times a day (TID) | INTRAMUSCULAR | Status: DC
  Administered 2020-12-04: 5 [IU] via SUBCUTANEOUS
  Administered 2020-12-04: 2 [IU] via SUBCUTANEOUS
  Filled 2020-12-03 (×2): qty 1

## 2020-12-03 MED ORDER — ALUM & MAG HYDROXIDE-SIMETH 200-200-20 MG/5ML PO SUSP
30.0000 mL | Freq: Once | ORAL | Status: AC
  Administered 2020-12-03: 30 mL via ORAL
  Filled 2020-12-03: qty 30

## 2020-12-03 MED ORDER — ATORVASTATIN CALCIUM 20 MG PO TABS: 80.0000 mg | ORAL_TABLET | Freq: Every evening | ORAL | Status: DC

## 2020-12-03 MED ORDER — CLOPIDOGREL BISULFATE 75 MG PO TABS
75.0000 mg | ORAL_TABLET | Freq: Every day | ORAL | Status: DC
  Administered 2020-12-04: 75 mg via ORAL
  Filled 2020-12-03: qty 1

## 2020-12-03 MED ORDER — ASPIRIN 81 MG PO CHEW: 324.0000 mg | CHEWABLE_TABLET | Freq: Once | ORAL | Status: DC

## 2020-12-03 NOTE — ED Triage Notes (Signed)
Pt arrives via EMS from home for CP that is centralized and radiates to her jaw and down her R arm- pt had an MI last month with a cath- pt was sinus tach for EMS and had a cbg of 332- pt took 324 ASA prior to EMS

## 2020-12-03 NOTE — ED Notes (Signed)
Critical trop reported to dr Roxan Hockey

## 2020-12-03 NOTE — Progress Notes (Signed)
Update: I was notified by RN that the patient's admission screening COVID-19 PCR performed earlier this evening has returned positive.     #) positive COVID-19 result: routine screening admission COVID-19 screen performed today positive, relative to the result of the most recent prior COVID-19 test on 10/28/2020, also as an admission screen, which was found to be negative.  This is in the context of no recent respiratory or infectious symptoms felt to be consistent with COVID-19 infection.  Her presenting chest pain appears to be unrelated to this result, particularly given nearly identical chest pain that she was experiencing at the time of her negative COVID-19 test on 10/28/2020.  Consequently, today's positive COVID-19 finding appears to be completely incidental in nature and does not represent the reason for today's admission.  Of note, presentation does not appear to be associated with any acute hypoxic respiratory distress/failure, and patient has been maintaining O2 sats in the high 90s on room air.  Therefore no indication for initiation of systemic corticosteroids at this time.  As the patient is being admitted for a completely different reason relative to the positive COVID-19 screening result, and as this patient appears asymptomatic from a COVID-19 standpoint, criteria do not appear to be met for initiation of a 3 day course of remdesivir at this time per treatment guidance recommendations from [facility]'s Covid Treatment Guidelines. No known chronic underlying pulmonary pathology, and presenting CXR today shows no evidence of acute cardiopulmonary process. Will closely monitor ensuing clinical trend, including monitoring for development of respiratory symptoms as well as evidence of acute hypoxic respiratory distress, while providing interval supportive care, as further described below.     Plan: Airborne and contact precautions. Monitor pulse oximetry with routine vital signs. prn  supplemental O2 to maintain O2 sats greater than or equal to 94%. Refraining from initiation of dexamethasone and remdesivir, as above. Prn albuterol inhaler. Repeat CBC in the AM.      Babs Bertin, DO Hospitalist

## 2020-12-03 NOTE — ED Notes (Signed)
Pt in bed eating and report CP and jaw pain on both sites. MD aware.

## 2020-12-03 NOTE — ED Notes (Signed)
Lab called Pt COV +

## 2020-12-03 NOTE — ED Provider Notes (Signed)
[facility] Emergency Department Provider Note    Event Date/Time   First MD Initiated Contact with Patient 12/03/20 1709     (approximate)  I have reviewed the triage vital signs and the nursing notes.   HISTORY  Chief Complaint Chest Pain    HPI [name] is a 35 y.o. female extensive listed past medical history and recent hospitalization for chest pain jaw pain with left heart cath anxious work-up.  We presenting to ER for similar chest discomfort and jaw pain started last night has been constant.  No positional changes.  No shortness of breath.  No nausea or vomiting.  States she been compliant with her Eliquis Plavix as well as other medications.  Denies any substance use.  Has cut back on smoking now only smoking half pack per day.  Denies any associated diaphoresis.  LHC 10/28/20   The left ventricular systolic function is normal.   LV end diastolic pressure is normal.   The left ventricular ejection fraction is 55-65% by visual estimate.   Conclusion Successful left heart cath from right radial approach Normal left ventricular function at 60% Normal coronaries no evidence of obstructive disease Intervention deferred not indicated TR band applied  Echo 10/29/20  1. Left ventricular ejection fraction, by estimation, is 50 to 55%. The  left ventricle has low normal function. The left ventricle has no regional  wall motion abnormalities. Left ventricular diastolic parameters were  normal.   2. Right ventricular systolic function is normal. The right ventricular  size is normal.   3. The mitral valve is normal in structure. No evidence of mitral valve  regurgitation.   4. The aortic valve is normal in structure. Aortic valve regurgitation is  not visualized.   Past Medical History:  Diagnosis Date   Collagen vascular disease (HCC)    HX: RT blood clot in leg, PE   Diabetes mellitus without complication (HCC)    Pulmonary embolism (HCC)     Family History  Problem Relation Age of Onset   Diabetes Mother    Hypertension Mother    COPD Mother    COPD Father    Stroke Father    Past Surgical History:  Procedure Laterality Date   ABDOMINAL HYSTERECTOMY     IVC filter     LEFT HEART CATH AND CORONARY ANGIOGRAPHY N/A 10/28/2020   Procedure: LEFT HEART CATH AND CORONARY ANGIOGRAPHY and possible PCI and stent;  Surgeon: Alwyn Pea, MD;  Location: [facility] INVASIVE CV LAB;  Service: Cardiovascular;  Laterality: N/A;   tubal ligaton     Patient Active Problem List   Diagnosis Date Noted   NSTEMI (non-ST elevated myocardial infarction) (HCC) 10/28/2020   Type 2 diabetes with complication (HCC) 04/28/2018   Positive ANA (antinuclear antibody) 06/28/2016   Postphlebitic syndrome 04/21/2015   DDD (degenerative disc disease), lumbar 04/21/2015   Obesity, Class II, BMI 35-39.9 04/21/2015   Anxiety, generalized 02/04/2015   Hx pulmonary embolism 08/16/2014   Acid reflux 03/25/2014   Current tobacco use 03/25/2014   Tobacco abuse 03/25/2014   GERD (gastroesophageal reflux disease) 03/25/2014   H/O deep venous thrombosis 12/01/2013      Prior to Admission medications   Medication Sig Start Date End Date Taking? Authorizing Provider  apixaban (ELIQUIS) 5 MG TABS tablet Take 5 mg by mouth 2 (two) times daily.    [provider]  atorvastatin (LIPITOR) 80 MG tablet Take 1 tablet (80 mg total) by mouth daily.  10/30/20 01/28/21  Lorin Glass, MD  clopidogrel (PLAVIX) 75 MG tablet Take 1 tablet (75 mg total) by mouth daily. 10/29/20 04/27/21  Lorin Glass, MD  metFORMIN (GLUCOPHAGE) 500 MG tablet Take 500 mg by mouth 2 (two) times daily with a meal.    [provider]  omeprazole (PRILOSEC) 40 MG capsule Take 1 capsule (40 mg total) by mouth daily. 04/21/15   Plonk, Chrissie Noa, MD  clonazePAM (KLONOPIN) 0.5 MG tablet Take 1 mg by mouth 2 (two) times daily.   06/20/19  [provider]  oxyCODONE-acetaminophen  (PERCOCET/ROXICET) 5-325 MG tablet Take 1 tablet by mouth every 4 (four) hours as needed. 10/13/15 06/20/19  Yevette Edwards, MD    Allergies Penicillins, Xarelto [rivaroxaban], and Ibuprofen    Social History Social History   Tobacco Use   Smoking status: Every Day    Packs/day: 0.50    Years: 17.00    Pack years: 8.50    Types: Cigarettes   Smokeless tobacco: Never  Vaping Use   Vaping Use: Never used  Substance Use Topics   Alcohol use: No    Alcohol/week: 0.0 standard drinks   Drug use: No    Review of Systems Patient denies headaches, rhinorrhea, blurry vision, numbness, shortness of breath, chest pain, edema, cough, abdominal pain, nausea, vomiting, diarrhea, dysuria, fevers, rashes or hallucinations unless otherwise stated above in HPI. ____________________________________________   PHYSICAL EXAM:  VITAL SIGNS: Vitals:   12/03/20 1730 12/03/20 1800  BP: 110/79 117/81  Pulse: 96 95  Resp: 15 16  Temp:    SpO2: 98% 96%    Constitutional: Alert and oriented.  Eyes: Conjunctivae are normal.  Head: Atraumatic. Nose: No congestion/rhinnorhea. Mouth/Throat: Mucous membranes are moist.   Neck: No stridor. Painless ROM.  Cardiovascular: Normal rate, regular rhythm. Grossly normal heart sounds.  Good peripheral circulation. Respiratory: Normal respiratory effort.  No retractions. Lungs CTAB. Gastrointestinal: Soft and nontender. No distention. No abdominal bruits. No CVA tenderness. Genitourinary:  Musculoskeletal: No lower extremity tenderness nor edema.  No joint effusions. Neurologic:  Normal speech and language. No gross focal neurologic deficits are appreciated. No facial droop Skin:  Skin is warm, dry and intact. No rash noted. Psychiatric: Mood and affect are normal. Speech and behavior are normal.  ____________________________________________   LABS (all labs ordered are listed, but only abnormal results are displayed)  Results for orders placed or  performed during the hospital encounter of 12/03/20 (from the past 24 hour(s))  Basic metabolic panel     Status: Abnormal   Collection Time: 12/03/20  3:50 PM  Result Value Ref Range   Sodium 132 (L) 135 - 145 mmol/L   Potassium 4.2 3.5 - 5.1 mmol/L   Chloride 98 98 - 111 mmol/L   CO2 23 22 - 32 mmol/L   Glucose, Bld 289 (H) 70 - 99 mg/dL   BUN 9 6 - 20 mg/dL   Creatinine, Ser 5.05 0.44 - 1.00 mg/dL   Calcium 9.0 8.9 - 39.7 mg/dL   GFR, Estimated >67 >34 mL/min   Anion gap 11 5 - 15  CBC     Status: Abnormal   Collection Time: 12/03/20  3:50 PM  Result Value Ref Range   WBC 10.1 4.0 - 10.5 K/uL   RBC 5.15 (H) 3.87 - 5.11 MIL/uL   Hemoglobin 15.0 12.0 - 15.0 g/dL   HCT 19.3 79.0 - 24.0 %   MCV 88.2 80.0 - 100.0 fL   MCH 29.1 26.0 - 34.0 pg  MCHC 33.0 30.0 - 36.0 g/dL   RDW 82.9 93.7 - 16.9 %   Platelets 211 150 - 400 K/uL   nRBC 0.0 0.0 - 0.2 %  Troponin I (High Sensitivity)     Status: Abnormal   Collection Time: 12/03/20  3:50 PM  Result Value Ref Range   Troponin I (High Sensitivity) 910 (HH) <18 ng/L  Protime-INR (order if Patient is taking Coumadin / Warfarin)     Status: None   Collection Time: 12/03/20  3:50 PM  Result Value Ref Range   Prothrombin Time 13.6 11.4 - 15.2 seconds   INR 1.0 0.8 - 1.2  Troponin I (High Sensitivity)     Status: Abnormal   Collection Time: 12/03/20  5:34 PM  Result Value Ref Range   Troponin I (High Sensitivity) 1,211 (HH) <18 ng/L  Lipase, blood     Status: None   Collection Time: 12/03/20  5:34 PM  Result Value Ref Range   Lipase 25 11 - 51 U/L  Hepatic function panel     Status: None   Collection Time: 12/03/20  5:34 PM  Result Value Ref Range   Total Protein 7.5 6.5 - 8.1 g/dL   Albumin 3.8 3.5 - 5.0 g/dL   AST 24 15 - 41 U/L   ALT 15 0 - 44 U/L   Alkaline Phosphatase 67 38 - 126 U/L   Total Bilirubin 0.7 0.3 - 1.2 mg/dL   Bilirubin, Direct 0.2 0.0 - 0.2 mg/dL   Indirect Bilirubin 0.5 0.3 - 0.9 mg/dL    ____________________________________________  EKG My review and personal interpretation at Time: 15:24   Indication: chest pain  Rate: 110  Rhythm: sinus Axis: normal Other: normal intervals, no stemi, no depressions ____________________________________________  RADIOLOGY  I personally reviewed all radiographic images ordered to evaluate for the above acute complaints and reviewed radiology reports and findings.  These findings were personally discussed with the patient.  Please see medical record for radiology report.  ____________________________________________   PROCEDURES  Procedure(s) performed:  Procedures    Critical Care performed: no ____________________________________________   INITIAL IMPRESSION / ASSESSMENT AND PLAN / ED COURSE  Pertinent labs & imaging results that were available during my care of the patient were reviewed by me and considered in my medical decision making (see chart for details).   DDX: ACS, pericarditis, esophagitis, boerhaaves, pe, dissection, pna, bronchitis, costochondritis   Elliana L Szafran is a 35 y.o. who presents to the ED with presentation as described above afebrile hemodynamically stable.  EKG unchanged from previous.  Previous cardiac work-up reviewed with no sign of coronary disease.  Troponin is elevated from 900 increasing to 1200.  Patient given morphine for pain meds adamantly denies any substance abuse.  States that she has been undergoing quite a bit of stress.  Question whether this could be stress cardiomyopathy.  Discussed case in consultation with Dr. Beola Cord shows of cardiology who agrees to hold heparin as the patient is already anticoagulated on Eliquis with recent left heart cath with clean coronaries.  Will discuss with hospitalist for admission.     The patient was evaluated in Emergency Department today for the symptoms described in the history of present illness. He/she was evaluated in the context of the global  COVID-19 pandemic, which necessitated consideration that the patient might be at risk for infection with the SARS-CoV-2 virus that causes COVID-19. Institutional protocols and algorithms that pertain to the evaluation of patients at risk for COVID-19 are in a  state of rapid change based on information released by regulatory bodies including the CDC and federal and state organizations. These policies and algorithms were followed during the patient's care in the ED.  As part of my medical decision making, I reviewed the following data within the electronic MEDICAL RECORD NUMBER Nursing notes reviewed and incorporated, Labs reviewed, notes from prior ED visits and [place] Controlled Substance Database   ____________________________________________   FINAL CLINICAL IMPRESSION(S) / ED DIAGNOSES  Final diagnoses:  Chest pain, unspecified type      NEW MEDICATIONS STARTED DURING THIS VISIT:  New Prescriptions   No medications on file     Note:  This document was prepared using Dragon voice recognition software and may include unintentional dictation errors.    Willy Eddy, MD 12/03/20 [phone]

## 2020-12-03 NOTE — H&P (Addendum)
History and Physical    PLEASE NOTE THAT DRAGON DICTATION SOFTWARE WAS USED IN THE CONSTRUCTION OF THIS NOTE.   [name] POE:[id number] DOB: 28-Oct-1985 DOA: 12/03/2020  PCP: Romualdo Bolk, FNP Patient coming from: home   I have personally briefly reviewed patient's old medical records in [place]  Chief Complaint: chest pain  HPI: [name] is a 35 y.o. female with medical history significant for type 2 diabetes mellitus, chronic tobacco abuse, generalized anxiety disorder, GERD, DVT in right lower extremity in September 2015, pulmonary embolism in May 2016, no chronically anticoagulated on Eliquis, who is admitted to [facility] on 12/03/2020 for further evaluation management of chest pain after presenting from home to [facility] ED complaining of such.   The patient reports that around 2100 on 12/02/2020, that she developed bilateral jaw pain while at rest.  The jaw pain has remained constant since onset, and she notes ensuing development of left-sided chest pain starting approximately 2 hours after onset of the aforementioned bilateral jaw pain.  She describes the left-sided chest pain as nonradiating pressure that has been constant since onset last evening.  She reports exacerbation of the associated intensity will sided chest pain with exertion, noting that the chest pain intensifies with ambulation, and improves, but does not resolve, with rest.  She also notes a pleuritic aspect to her chest pain, noting exacerbation with deep inspiration.  Denies any recent cough or hemoptysis.  Pain is nonpositional.  She also notes exacerbation of her chest pain with palpation over the left anterior chest wall.  She also notes exacerbation of her chest pain while eating. denies any recent trauma or injury.  Has not had any nitroglycerin.  She notes associated intermittent nausea resulting in 1 episode of nonbloody, nonbilious emesis that occurred last evening.  Denies any  associated shortness of breath, palpitations, diaphoresis, dizziness, presyncope, or syncope.    Not associated with any subjective fever, chills, rigors, or generalized myalgias.  Not associated with any cough, wheezing, abdominal pain, diarrhea, melena, hematochezia, or rash.  Not associated with any new lower extremity edema, calf tenderness, or new lower extremity erythema.  Denies any recent travel, periods of prolonged diminished ambulatory activity, or personal history of malignancy.  She conveys a history of DVT in the right lower extremity in September 2015 followed by pulmonary embolism in May 2016.  She notes that she is anticoagulated on Eliquis, and reports outstanding compliance with this medication, noting no history of any missed doses of such.  Denies any regular or recent alcohol consumption, denies any history of recreational drug use.  Medical history notable for type 2 diabetes mellitus, GERD for which the patient is on omeprazole, and chronic tobacco abuse, noting that she is a current smoker.  She conveys a recent decrease in her daily smoking habits, noting that she is now down to smoking half pack per day.  She also confirms a history of underlying generalized anxiety disorder, and reports a significant amount of stress at home recently in the setting of caring for multiple family members.  She conveys that he left-sided chest as well as bilateral jaw pain with which she presents to the ED this evening's is very similar, to that with which she presented to [facility] ED approximately 5 weeks ago.  At that time, her initial high-sensitivity troponin was found to be 92, before subsequent elevation with peak value noted to be 5681.  Most recent troponin was noted to be trending  down, and found to be 5399 on 10/28/2020.  During this previous hospitalization, the patient underwent left-sided coronary angiography on 10/28/2020, which showed normal coronary arteries, including no evidence of  obstructive CAD.  Echocardiogram on 10/29/2020 showed LVEF 50 to 55%, no evidence of focal wall motion normalities, normal diastolic parameters, and no evidence of significant valvular pathology.  She also underwent CTA of the chest, which showed no evidence of acute pulmonary embolism and no evidence of any additional acute cardiopulmonary or intrathoracic process.  Following discharge from the August 2022 hospitalization, the patient reports that she has been completely chest pain-free until development of return of her left-sided chest discomfort last evening, as above.     ED Course:  Vital signs in the ED were notable for the following: Tetramex 98.5; heart rate 95-1 09, blood pressure 110/79 -117/81; respiratory rate 15-18, oxygen saturation 96 to 98% on room air.  Labs were notable for the following: CMP was notable for the following: Sodium 132, which corrects to 135 when taking into account concomitant hyperglycemia, potassium 4.2, bicarbonate 23, anion gap 11, creatinine 0.61, glucose 289, and liver enzymes were found to be within normal limits.  Lipase 25.  Initial high-sensitivity troponin I found to be 910, with repeat value trending up to 1200.  CBC notable for white blood cell count 10,100, hemoglobin 15.  INR 1.0.  Urinary drug screen is been ordered, with result currently pending.  Screening nasopharyngeal COVID-19/influenza PCR were checked in the ED this evening, with result currently pending.  Imaging and additional notable ED work-up: EKG showed sinus tachycardia with heart rate 108, normal intervals, and no evidence of T wave or ST changes, including no evidence of ST elevation.  2 view chest x-ray showed no evidence of acute cardiopulmonary process, including no evidence of infiltrate, edema, effusion, or pneumothorax.  The EDP discussed the patient's case with the on-call cardiologist, Dr. Saralyn Pilar, who recommends overnight observation to the hospitalist service for further  evaluation/management of presenting chest pain.  As the patient is compliant with her chronic anticoagulation on home Eliquis and in the context of recent coronary angiography showing normal coronary arteries without evidence of obstructive disease, Dr. Saralyn Pilar did not feel that heparin drip was warranted at this time.  Dr. Saralyn Pilar to formally consult, and will evaluate the patient in person.  In the meantime, he recommends further trending of serial troponin, monitoring on telemetry, and symptomatic management of presenting chest pain.  While in the ED, the following were administered: Full dose aspirin x1, Lopressor 12.5 mg p.o. x1, Zofran 4 mg IV x1, and morphine 4 mg IV x1.  Subsequently, the patient was admitted for overnight observation to the PCU for further evaluation management of presenting chest pain.     Review of Systems: As per HPI otherwise 10 point review of systems negative.   Past Medical History:  Diagnosis Date   Collagen vascular disease ([place])    HX: RT blood clot in leg, PE   Diabetes mellitus without complication ([place])    DVT (deep venous thrombosis) ([place])    RLE; Sept 2015   Pulmonary embolism ([place])    May 2016    Past Surgical History:  Procedure Laterality Date   ABDOMINAL HYSTERECTOMY     IVC filter     LEFT HEART CATH AND CORONARY ANGIOGRAPHY N/A 10/28/2020   Procedure: LEFT HEART CATH AND CORONARY ANGIOGRAPHY and possible PCI and stent;  Surgeon: Yolonda Kida, MD;  Location: [place] [facility];  Service: Cardiovascular;  Laterality: N/A;   tubal ligaton      Social History:  reports that she has been smoking cigarettes. She has a 8.50 pack-year smoking history. She has never used smokeless tobacco. She reports that she does not drink alcohol and does not use drugs.   Allergies  Allergen Reactions   Penicillins Other (See Comments)    Reaction:  Unknown   Xarelto [Rivaroxaban] Other (See Comments)    Reaction: Pt said it "made me feel like  I was drunk."   Ibuprofen Rash    Family History  Problem Relation Age of Onset   Diabetes Mother    Hypertension Mother    COPD Mother    COPD Father    Stroke Father     Family history reviewed and not pertinent    Prior to Admission medications   Medication Sig Start Date End Date Taking? Authorizing Provider  apixaban (ELIQUIS) 5 MG TABS tablet Take 5 mg by mouth 2 (two) times daily.   Yes [provider]  atorvastatin (LIPITOR) 80 MG tablet Take 1 tablet (80 mg total) by mouth daily. 10/30/20 01/28/21 Yes Dahal, Marlowe Aschoff, MD  clopidogrel (PLAVIX) 75 MG tablet Take 1 tablet (75 mg total) by mouth daily. 10/29/20 04/27/21 Yes Dahal, Marlowe Aschoff, MD  metFORMIN (GLUCOPHAGE) 500 MG tablet Take 500 mg by mouth 2 (two) times daily with a meal.   Yes [provider]  omeprazole (PRILOSEC) 40 MG capsule Take 1 capsule (40 mg total) by mouth daily. 04/21/15  Yes Plonk, Gwyndolyn Saxon, MD  clonazePAM (KLONOPIN) 0.5 MG tablet Take 1 mg by mouth 2 (two) times daily.   06/20/19  [provider]  oxyCODONE-acetaminophen (PERCOCET/ROXICET) 5-325 MG tablet Take 1 tablet by mouth every 4 (four) hours as needed. 10/13/15 06/20/19  Molli Barrows, MD     Objective    Physical Exam: Vitals:   12/03/20 1525 12/03/20 1730 12/03/20 1738 12/03/20 1800  BP: 99/68 110/79  117/81  Pulse: (!) 109 96  95  Resp: _0 Temp: 98.5 F (36.9 C)     TempSrc: Oral     SpO2: 96% 98%  96%  Weight:   97.4 kg   Height:        General: appears to be stated age; alert, oriented Skin: warm, dry, no rash Head:  AT/[place] Mouth:  Oral mucosa membranes appear moist, normal dentition Neck: supple; trachea midline Heart:  RRR; did not appreciate any M/R/G Chest: Tenderness with palpation over the left anterior chest wall; no evidence of paradoxical chest rise/fall Lungs: CTAB, did not appreciate any wheezes, rales, or rhonchi Abdomen: + BS; soft, ND, NT Vascular: 2+ pedal pulses b/l; 2+ radial pulses  b/l Extremities: no peripheral edema, no muscle wasting Neuro: strength and sensation intact in upper and lower extremities b/l    Labs on Admission: I have personally reviewed following labs and imaging studies  CBC: Recent Labs  Lab 12/03/20 1550  WBC 10.1  HGB 15.0  HCT 45.4  MCV 88.2  PLT 932   Basic Metabolic Panel: Recent Labs  Lab 12/03/20 1550  NA 132*  K 4.2  CL 98  CO2 23  GLUCOSE 289*  BUN 9  CREATININE 0.61  CALCIUM 9.0   GFR: Estimated Creatinine Clearance: 112.3 mL/min (by C-G formula based on SCr of 0.61 mg/dL). Liver Function Tests: Recent Labs  Lab 12/03/20 1734  AST 24  ALT 15  ALKPHOS 67  BILITOT 0.7  PROT 7.5  ALBUMIN 3.8   Recent Labs  Lab 12/03/20 1734  LIPASE 25   No results for input(s): AMMONIA in the last 168 hours. Coagulation Profile: Recent Labs  Lab 12/03/20 1550  INR 1.0   Cardiac Enzymes: No results for input(s): CKTOTAL, CKMB, CKMBINDEX, TROPONINI in the last 168 hours. BNP (last 3 results) No results for input(s): PROBNP in the last 8760 hours. HbA1C: No results for input(s): HGBA1C in the last 72 hours. CBG: No results for input(s): GLUCAP in the last 168 hours. Lipid Profile: No results for input(s): CHOL, HDL, LDLCALC, TRIG, CHOLHDL, LDLDIRECT in the last 72 hours. Thyroid Function Tests: No results for input(s): TSH, T4TOTAL, FREET4, T3FREE, THYROIDAB in the last 72 hours. Anemia Panel: No results for input(s): VITAMINB12, FOLATE, FERRITIN, TIBC, IRON, RETICCTPCT in the last 72 hours. Urine analysis:    Component Value Date/Time   COLORURINE AMBER (A) 08/10/2020 0944   APPEARANCEUR CLOUDY (A) 08/10/2020 0944   APPEARANCEUR Cloudy 03/03/2014 2107   LABSPEC >1.030 (H) 08/10/2020 0944   LABSPEC 1.031 03/03/2014 2107   PHURINE 5.5 08/10/2020 0944   GLUCOSEU >1,000 (A) 08/10/2020 0944   GLUCOSEU 50 mg/dL 03/03/2014 2107   HGBUR LARGE (A) 08/10/2020 0944   BILIRUBINUR SMALL (A) 08/10/2020 0944    BILIRUBINUR Negative 03/03/2014 2107   KETONESUR TRACE (A) 08/10/2020 0944   PROTEINUR 100 (A) 08/10/2020 0944   NITRITE NEGATIVE 08/10/2020 0944   LEUKOCYTESUR TRACE (A) 08/10/2020 0944   LEUKOCYTESUR Negative 03/03/2014 2107    Radiological Exams on Admission: DG Chest 2 View  Result Date: 12/03/2020 CLINICAL DATA:  Chest pain. EXAM: CHEST - 2 VIEW COMPARISON:  October 27, 2020 FINDINGS: Opacity in the lateral right lung base is stable since October 27, 2020. A CT scan on October 28, 2020 demonstrated scarring or atelectasis in this region. The heart, hila, mediastinum, lungs, and pleura are otherwise unchanged and unremarkable. IMPRESSION: No acute abnormalities or changes. Electronically Signed   By: Dorise Bullion III M.D.   On: 12/03/2020 16:32     EKG: Independently reviewed, with result as described above.    Assessment/Plan   [name] is a 35 y.o. female with medical history significant for type 2 diabetes mellitus, chronic tobacco abuse, generalized anxiety disorder, GERD, DVT in right lower extremity in September 2015, pulmonary embolism in May 2016, no chronically anticoagulated on Eliquis, who is admitted to [facility] on 12/03/2020 for further evaluation management of chest pain after presenting from home to [facility] ED complaining of such.    Principal Problem:   Chest pain Active Problems:   Hx pulmonary embolism   Anxiety, generalized   Current tobacco use   Type 2 diabetes with complication (HCC)   GERD (gastroesophageal reflux disease)   Elevated troponin      #) Chest Pain: The patient presents with approximately 20 hours of constant left-sided chest pressure that is exertional, pleuritic and worsening with deep inspiration, nonpositional, associated with intermittent nausea/vomiting, and exacerbated in a prandial timeframe as well as exacerbated with palpation over the left anterior chest wall, and associated with bilateral jaw discomfort.   Presenting with continuation of sinus tachycardia without evidence of acute ischemic changes, including no evidence of ST elevation, while chest x-ray shows no evidence of cardiac will evidence of pneumothorax. patient reports that this presentation is very similar to that with which she was admitted approximately 5 weeks ago, which time she underwent left-sided coronary angiography revealed normal coronary arteries without evidence of obstructive  disease as well as echocardiogram notable for LVEF 50 to 55% and no evidence of focal motion abnormalities or significant valvular pathology.  We will today's presentation is associated with an elevated troponin, initially 900, before trending up to 1200, this is relative to a peak elevated troponin level of 5681 during her most recent prior hospitalization.  In the context of the results of her recent cardiac cath on 10/28/2020 showing normal-appearing coronary arteries without obstructive disease, ACS due to type I ischemia appears to be unlikely.  Additionally, CTA chest during most recent previous hospitalization showed no evidence of acute pulmonary embolism.  Given these results and patient's reported outstanding interval compliance on her chronic anticoagulation with Eliquis, renders the possibility of contributory acute pulmonary embolism to be less likely.  Presenting chest pain not associated with any tearing sensation or radiation into the back or to between the shoulder blades to suggest aortic dissection.   Case discussed with Dr. Saralyn Pilar of cardiology, who recommends overnight observation to the hospitalist service for further evaluation/management of presenting chest pain.  As the patient is compliant with her chronic anticoagulation on home Eliquis and in the context of recent coronary angiography showing normal coronary arteries without evidence of obstructive disease, Dr. Saralyn Pilar did not feel that heparin drip was warranted at this time.  Dr.  Saralyn Pilar to formally consult, and will evaluate the patient in person.  In the meantime, he recommends further trending of serial troponin, monitoring on telemetry, and symptomatic management of presenting chest pain.  Current differential for her presenting CP also includes the possibility of coronary artery vasospasm, including in the context of reported history of connective tissue disorder.  Urinary drug screen to further assess is currently pending.  Given prandial exacerbation, ddx also includes esophageal spasm, gastritis, pud.  Costochondritis is also in the differential given reproducibility with palpation over the left anterior chest wall.  Considered the possibility of pericarditis, although her chest pain has been nonpositional with presenting EKG showing no evidence of diffuse PR depression or ST elevation. Will check inflammatory markers to further assess.  There also may be 6 contribution from GAD given a documented history of such.  Lipase nonelevated, while liver enzymes were found to be within normal limits.  Additionally, significant trop elevation with normal coronary arteries and no obstructive CAD in the context of jaw pain raises the possibility of vasculitis, including temporal arteritis. Will assess general inflammatory markers, as noted below.   Patient acknowledges continuation of her constant CP at this time, but without significant worsening intensity relative to prior. VSS. Full dose aspirin was administered in the ED this evening and she worsening a single dose of Lopressor. Will resume high intensity atorvastatin, as below.     Plan: trend serial troponin. Monitor on telemetry.  Cardiology formally consulted, as further detailed above.  As needed IV Dilaudid.  Given prandial exacerbation, will try GI cocktail with viscous lidocaine x1 dose now.  Additionally, her omeprazole to IV Protonix with next dose now.  Add on serum magnesium level, with as needed supplementation to  maintain serum magnesium level greater than or equal to 2.0.  Check ESR and CRP, as above.  Follow-up result urinary drug screen.  As needed Ativan for anxiety.  Resume home high intensity atorvastatin, with next dose now.  Per recommendations of cardiology, will refrain from initiation of heparin drip, but rather continue chronic anticoagulation on Eliquis.  Continue Plavix.  Repeat CMP and CBC in the morning. May also consider trying prn  IV Toradol given reproducibility of discomfort with palpation over the anterior chest wall.       #) Elevated troponin: Initial troponin 900, with repeat value trending up to 1200, ALT relative to peak troponin value 5600 during hospitalization last month at which time left-sided coronary angiography showed normal coronaries without evidence of obstructive disease, rendering the possibility of ACS due to type I ischemia to be much less likely.  Presenting EKG shows no evidence of acute ischemic changes, including no evidence of ST elevation chest x-ray shows no evidence of acute cardiopulmonary process, including no evidence of thorax.  Acute PE unlikely given recent negative CTA chest at time of similar chest discomfort, with reported outstanding interval compliance chronic anticoagulation on Eliquis.  Differential includes coronary vasospasm in the setting of history connective tissue disorder.  Cardiology consulted, with Dr. Saralyn Pilar to formally see patient, with additional initial recommendations, as further detailed above, including recommending against initiation of heparin drip at this time, as further described above.  She has received full dose aspirin, beta-blocker x1, with plan for resumption of high intensity atorvastatin, and further trending of trops as noted below.   Plan: Monitor on telemetry. serial troponin levels has been scheduled for 2300 this evening as well as with tomorrow morning labs.  Cardiology formally consulted, as above.  Follow for result  urinary drug screen.  Add on serum magnesium level with as needed supplementation, as quantified above.  Repeat CMP and CBC in the morning.  Resume home high intensity atorvastatin, as above.  Check ESR and CRP, as above.       #) Chronic tobacco abuse: The patient knowledges that she is a current smoker, but has recently reduced her daily smoking to half pack per day.  Plan: Counseled the patient the importance of complete smoking discontinuation, particularly in the setting of recent chest discomfort.       #) History of pulmonary embolism: History of acute pulmonary embolism in May 2016, preceded by acute DVT involving the right lower extremity in September 2015.  Subsequently, the patient is chronically anticoagulated on Eliquis, reporting outstanding compliance with this medication, noting no history of any missed doses of such.  Of note, CTA chest in August 2022 showed no evidence of acute pulmonary embolism, with the patient confirming ensuing outstanding compliance with her home Eliquis.  Plan: Continue chronic anticoagulation with home Eliquis, with next dose ordered to be administered this evening.      #) Type 2 diabetes mellitus: Documented history of such.  On exogenous insulin dependent.  On metformin as an outpatient.  Presenting blood sugar noted to be 289, but in the absence of any evidence of anion gap metabolic acidosis.  Plan: Hold home metformin during this hospitalization.  Accu-Cheks before every meal and at bedtime with low  dose sliding scale insulin have been ordered.       #) Generalized anxiety disorder: Documented history of such, although she does not appear to be on any chronic antidepressant therapy as an outpatient nor on any as needed anxiolytic agent at home.  Plan: As needed Ativan ordered for use during this hospitalization, particularly given potential for secondary contribution relative to her presenting chest discomfort.      #) GERD:  Documented history of such, on omeprazole as an outpatient.  The potential for inhibition of home Plavix via this specific PPI is noted, without need for modifications at this time per pending formal cardiology consult, as above.   Plan: Prandial exacerbation of  her chest discomfort, and omeprazole for now.  Instead, I have ordered Protonix 40 mg IV daily, with next dose now, in addition to a single dose of GI cocktail with viscous lidocaine for both therapeutic as well as potentially diagnostic purposes as it relates to her presenting chest discomfort, as above.    DVT prophylaxis: Continue home Eliquis, as above Code Status: Full code Family Communication: none Disposition Plan: Per Rounding Team Consults called: Dr. Saralyn Pilar of cardiology formally consulted, as further detailed above;  Admission status: Observation; PCU     Of note, this patient was added by me to the following Admit List/Treatment Team: armcadmits.    Of note, the Adult Admission Order Set (Multimorbid order set) was used by me in the admission process for this patient.   PLEASE NOTE THAT DRAGON DICTATION SOFTWARE WAS USED IN THE CONSTRUCTION OF THIS NOTE.   [place] [facility] Pager [phone] From [place]  Otherwise, please contact night-coverage  [email or web address] Password [facility]   12/03/2020, 7:04 PM

## 2020-12-04 ENCOUNTER — Other Ambulatory Visit: Payer: Self-pay

## 2020-12-04 DIAGNOSIS — R778 Other specified abnormalities of plasma proteins: Secondary | ICD-10-CM | POA: Diagnosis not present

## 2020-12-04 DIAGNOSIS — R079 Chest pain, unspecified: Secondary | ICD-10-CM

## 2020-12-04 DIAGNOSIS — E785 Hyperlipidemia, unspecified: Secondary | ICD-10-CM

## 2020-12-04 DIAGNOSIS — D6859 Other primary thrombophilia: Secondary | ICD-10-CM | POA: Diagnosis not present

## 2020-12-04 DIAGNOSIS — M940 Chondrocostal junction syndrome [Tietze]: Secondary | ICD-10-CM | POA: Insufficient documentation

## 2020-12-04 DIAGNOSIS — U071 COVID-19: Secondary | ICD-10-CM | POA: Insufficient documentation

## 2020-12-04 DIAGNOSIS — E1169 Type 2 diabetes mellitus with other specified complication: Secondary | ICD-10-CM

## 2020-12-04 DIAGNOSIS — Z720 Tobacco use: Secondary | ICD-10-CM

## 2020-12-04 LAB — COMPREHENSIVE METABOLIC PANEL
ALT: 15 U/L (ref 0–44)
AST: 21 U/L (ref 15–41)
Albumin: 3.4 g/dL — ABNORMAL LOW (ref 3.5–5.0)
Alkaline Phosphatase: 57 U/L (ref 38–126)
Anion gap: 8 (ref 5–15)
BUN: 12 mg/dL (ref 6–20)
CO2: 26 mmol/L (ref 22–32)
Calcium: 8.7 mg/dL — ABNORMAL LOW (ref 8.9–10.3)
Chloride: 100 mmol/L (ref 98–111)
Creatinine, Ser: 0.5 mg/dL (ref 0.44–1.00)
GFR, Estimated: >60 mL/min (ref >60)
Glucose, Bld: 213 mg/dL — ABNORMAL HIGH (ref 70–99)
Potassium: 4.1 mmol/L (ref 3.5–5.1)
Sodium: 134 mmol/L — ABNORMAL LOW (ref 135–145)
Total Bilirubin: 0.8 mg/dL (ref 0.3–1.2)
Total Protein: 7.2 g/dL (ref 6.5–8.1)

## 2020-12-04 LAB — CBC
HCT: 43 % (ref 36.0–46.0)
Hemoglobin: 14.4 g/dL (ref 12.0–15.0)
MCH: 28.7 pg (ref 26.0–34.0)
MCHC: 33.5 g/dL (ref 30.0–36.0)
MCV: 85.7 fL (ref 80.0–100.0)
Platelets: 176 10*3/uL (ref 150–400)
RBC: 5.02 MIL/uL (ref 3.87–5.11)
RDW: 14.4 % (ref 11.5–15.5)
WBC: 9.2 10*3/uL (ref 4.0–10.5)
nRBC: 0 % (ref 0.0–0.2)

## 2020-12-04 LAB — TROPONIN I (HIGH SENSITIVITY)
Troponin I (High Sensitivity): 2724 ng/L (ref <18)
Troponin I (High Sensitivity): 3679 ng/L (ref <18)

## 2020-12-04 LAB — MAGNESIUM: Magnesium: 2 mg/dL (ref 1.7–2.4)

## 2020-12-04 LAB — C-REACTIVE PROTEIN: CRP: 3.7 mg/dL — ABNORMAL HIGH (ref <1.0)

## 2020-12-04 LAB — CBG MONITORING, ED
Glucose-Capillary: 196 mg/dL — ABNORMAL HIGH (ref 70–99)
Glucose-Capillary: 254 mg/dL — ABNORMAL HIGH (ref 70–99)

## 2020-12-04 MED ORDER — ENOXAPARIN SODIUM 100 MG/ML IJ SOSY
1.0000 mg/kg | PREFILLED_SYRINGE | Freq: Two times a day (BID) | INTRAMUSCULAR | Status: DC
  Administered 2020-12-04: 97.5 mg via SUBCUTANEOUS
  Filled 2020-12-04 (×3): qty 0.97

## 2020-12-04 MED ORDER — METHYLPREDNISOLONE SODIUM SUCC 40 MG IJ SOLR
40.0000 mg | Freq: Once | INTRAMUSCULAR | Status: AC
  Administered 2020-12-04: 40 mg via INTRAVENOUS
  Filled 2020-12-04: qty 1

## 2020-12-04 MED ORDER — ENOXAPARIN SODIUM 100 MG/ML IJ SOSY: 100.0000 mg | PREFILLED_SYRINGE | Freq: Two times a day (BID) | INTRAMUSCULAR | 0 refills | Status: AC

## 2020-12-04 NOTE — ED Notes (Signed)
Pt provided tv remote, diet coke and ice as requested

## 2020-12-04 NOTE — ED Notes (Signed)
Pt provided bedside toilet for toileting d/t covid +

## 2020-12-04 NOTE — ED Notes (Signed)
Cbg 254

## 2020-12-04 NOTE — ED Notes (Addendum)
This RN to bedside, pt repeatedly stating she wants to go home and asking to speak to MD. RN explained multiple times risks and will be leaving AMA. Dr Renae Gloss messaged via secure chat

## 2020-12-04 NOTE — Consult Note (Signed)
[facility] Cardiology  CARDIOLOGY CONSULT NOTE  Patient ID: [name] MRN: [id number] DOB/AGE: [birth year] 35 y.o.  Admit date: 12/03/2020 Referring Physician Howerter Primary Physician Nile Dear [facility] ) Primary Cardiologist  Reason for Consultation chest pain with elevated troponin  HPI: 35 year old female referred for evaluation of chest pain and elevated troponin.  The patient has known hypercoagulable state, followed at [facility].  She has history of DVT 11/2013 and pulmonary embolus 07/2014.  She was recently hospitalized with chest pain and elevated troponin.  2D echocardiogram 10/28/2020 revealed LVEF 50 to 55%. Cardiac catheterization 10/28/2020 which revealed normal coronary anatomy and normal left ventricular function, with LVEF 55 to 65%.  The patient was discharged home on Eliquis and clopidogrel.  He returns with recurrent chest pain associated with bilateral jaw pain.  ECG revealed sinus tachycardia but otherwise normal.  High-sensitivity troponin elevated ,910, 1211, 2724.  The patient reports feeling better today with only a "twinge" in her chest.  She was found to be incidentally positive for COVID-19.  She reports her fianc was recently positive for COVID.  She strongly desires to go home.  Review of systems complete and found to be negative unless listed above     Past Medical History:  Diagnosis Date   Collagen vascular disease (HCC)    HX: RT blood clot in leg, PE   Diabetes mellitus without complication (HCC)    DVT (deep venous thrombosis) (HCC)    RLE; Sept 2015   Pulmonary embolism (HCC)    May 2016    Past Surgical History:  Procedure Laterality Date   ABDOMINAL HYSTERECTOMY     IVC filter     LEFT HEART CATH AND CORONARY ANGIOGRAPHY N/A 10/28/2020   Procedure: LEFT HEART CATH AND CORONARY ANGIOGRAPHY and possible PCI and stent;  Surgeon: Alwyn Pea, MD;  Location: [facility] INVASIVE CV LAB;  Service: Cardiovascular;  Laterality: N/A;   tubal ligaton      (Not  in a hospital admission)  Social History   Socioeconomic History   Marital status: Single    Spouse name: Not on file   Number of children: Not on file   Years of education: Not on file   Highest education level: Not on file  Occupational History   Not on file  Tobacco Use   Smoking status: Every Day    Packs/day: 0.50    Years: 17.00    Pack years: 8.50    Types: Cigarettes   Smokeless tobacco: Never  Vaping Use   Vaping Use: Never used  Substance and Sexual Activity   Alcohol use: No    Alcohol/week: 0.0 standard drinks   Drug use: No   Sexual activity: Not on file  Other Topics Concern   Not on file  Social History Narrative   Not on file   Social Determinants of Health   Financial Resource Strain: Not on file  Food Insecurity: Not on file  Transportation Needs: Not on file  Physical Activity: Not on file  Stress: Not on file  Social Connections: Not on file  Intimate Partner Violence: Not on file    Family History  Problem Relation Age of Onset   Diabetes Mother    Hypertension Mother    COPD Mother    COPD Father    Stroke Father       Review of systems complete and found to be negative unless listed above      PHYSICAL EXAM  General: Well developed,  well nourished, in no acute distress HEENT:  Normocephalic and atramatic Neck:  No JVD.  Lungs: Clear bilaterally to auscultation and percussion. Heart: HRRR . Normal S1 and S2 without gallops or murmurs.  Abdomen: Bowel sounds are positive, abdomen soft and non-tender  Msk:  Back normal, normal gait. Normal strength and tone for age. Extremities: No clubbing, cyanosis or edema.   Neuro: Alert and oriented X 3. Psych:  Good affect, responds appropriately  Labs:   Lab Results  Component Value Date   WBC 9.2 12/04/2020   HGB 14.4 12/04/2020   HCT 43.0 12/04/2020   MCV 85.7 12/04/2020   PLT 176 12/04/2020    Recent Labs  Lab 12/03/20 1550 12/03/20 1734  NA 132*  --   K 4.2  --   CL 98   --   CO2 23  --   BUN 9  --   CREATININE 0.61  --   CALCIUM 9.0  --   PROT  --  7.5  BILITOT  --  0.7  ALKPHOS  --  67  ALT  --  15  AST  --  24  GLUCOSE 289*  --    Lab Results  Component Value Date   CKTOTAL 72 03/09/2014   CKMB < 0.5 (L) 03/09/2014   TROPONINI <0.03 08/25/2014    Lab Results  Component Value Date   CHOL 215 (H) 10/28/2020   CHOL 161 07/13/2014   Lab Results  Component Value Date   HDL 25 (L) 10/28/2020   HDL 21 (L) 07/13/2014   Lab Results  Component Value Date   LDLCALC 133 (H) 10/28/2020   LDLCALC SEE COMMENT 07/13/2014   Lab Results  Component Value Date   TRIG 284 (H) 10/28/2020   TRIG 500 (H) 07/13/2014   Lab Results  Component Value Date   CHOLHDL 8.6 10/28/2020   No results found for: LDLDIRECT    Radiology: DG Chest 2 View  Result Date: 12/03/2020 CLINICAL DATA:  Chest pain. EXAM: CHEST - 2 VIEW COMPARISON:  October 27, 2020 FINDINGS: Opacity in the lateral right lung base is stable since October 27, 2020. A CT scan on October 28, 2020 demonstrated scarring or atelectasis in this region. The heart, hila, mediastinum, lungs, and pleura are otherwise unchanged and unremarkable. IMPRESSION: No acute abnormalities or changes. Electronically Signed   By: Gerome Sam III M.D.   On: 12/03/2020 16:32    EKG: Sinus tachycardia, otherwise normal  ASSESSMENT AND PLAN:   1.  Chest pain, nearly resolved, elevated high-sensitivity troponin (110, 1211, 2724), with normal ECG.  Recent cardiac catheterization 10/28/2020 reveals normal coronary anatomy with normal left ventricular function.  Patient currently on Eliquis and clopidogrel.  Patient very eager to go home.  I convinced her that it would be most 2.  Hypercoagulable state, followed at [facility] 3.  COVID-19 positive, essentially asymptomatic 4.  Tobacco abuse 5.  Type 2 diabetes  Recommendations  Hold Eliquis for now Repeated Lovenox 1 mg/kg every 12h  x 48 hours Continue to  cycle troponins Continue clopidogrel May consider discharge home once troponins downtrending (the patient is experienced taking home Lovenox, and can complete Lovenox x 48 hours at home once troponins downtrend) Resume Eliquis following Lovenox 48-hours Follow-up [facility] May follow-up with [facility] cardiology in 1 to 2 weeks  Signed: Marcina Millard MD,PhD, [facility] 12/04/2020, 9:00 AM

## 2020-12-04 NOTE — ED Notes (Signed)
RN called into pt room. Pt sts " I am going to sign the AMA form to leave. You guys are not doing anything here that I cant do at home." RN called and talked to provider. Provider talking to pt and pt sts that verbalized understanding of risks and benefits up to including death. RN also made pt verbalized risks and benefits up to including death. ED charge nurse at bedside while pt verbalized understanding.

## 2020-12-04 NOTE — Progress Notes (Signed)
Patient ID: [name], female   DOB: 03-12-1986, 35 y.o.   MRN: [id number] [facility] PROGRESS NOTE  [name] PIR:[id number] DOB: 09/16/1985 DOA: 12/03/2020 PCP: Olena Leatherwood, FNP  HPI/Subjective: Patient still has a little chest pain but less than yesterday.  Had cardiac work-up last month including a negative cardiac catheterization.  Patient disappointed that she is not able to go home today.  With patient's troponin rising we will continue to monitor another day.  Also found to be COVID-positive.  Objective: Vitals:   12/04/20 0513 12/04/20 0750  BP: 127/83 123/66  Pulse: 88 90  Resp: 18 16  Temp:    SpO2: 94% 98%   No intake or output data in the 24 hours ending 12/04/20 1058 Filed Weights   12/03/20 1523 12/03/20 1738  Weight: 104.3 kg 97.4 kg    ROS: Review of Systems  Respiratory:  Positive for shortness of breath. Negative for cough.   Cardiovascular:  Positive for chest pain.  Gastrointestinal:  Negative for abdominal pain, nausea and vomiting.  Exam: Physical Exam HENT:     Head: Normocephalic.  Eyes:     General: Lids are normal.     Conjunctiva/sclera: Conjunctivae normal.  Cardiovascular:     Rate and Rhythm: Normal rate and regular rhythm.     Heart sounds: Normal heart sounds, S1 normal and S2 normal.     Comments: Pain to palpation over ribs left chest. Pulmonary:     Breath sounds: Normal breath sounds. No decreased breath sounds, wheezing, rhonchi or rales.  Abdominal:     Palpations: Abdomen is soft.     Tenderness: There is no abdominal tenderness.  Musculoskeletal:     Right lower leg: No swelling.     Left lower leg: No swelling.  Skin:    General: Skin is warm.     Findings: No rash.  Neurological:     Mental Status: She is alert and oriented to person, place, and time.      Scheduled Meds:  aspirin  324 mg Oral Once   atorvastatin  80 mg Oral QPM   clopidogrel  75 mg Oral Daily   enoxaparin (LOVENOX) injection  1 mg/kg  Subcutaneous Q12H   insulin aspart  0-9 Units Subcutaneous TID WC   methylPREDNISolone (SOLU-MEDROL) injection  40 mg Intravenous Once   metoprolol tartrate  12.5 mg Oral Once   pantoprazole (PROTONIX) IV  40 mg Intravenous Q24H    Assessment/Plan:  Chest pain with rising troponin.  This morning's troponin 3679 cardiology recommending switching Eliquis over to Lovenox for 48 hours.  Continue Plavix.  Cardiac catheterization on 10/28/2020 revealed normal coronary arteries and normal left ventricular function.  Possible costochondritis with pain to palpating chest wall..  We will give 1 dose of Solu-Medrol Hypercoagulable state with numerous pulmonary embolisms.  Last month had a CT scan of the chest with contrast negative for pulmonary embolism.  Continue Lovenox for 48 hours and then back to Eliquis. COVID-positive.  Patient not having any symptoms.  Declined Paxlovid treatment at this point. Tobacco abuse Type 2 diabetes mellitus with hyperlipidemia on atorvastatin.  Holding metformin currently.  Sliding scale insulin.    Code Status:     Code Status Orders  (From admission, onward)           Start     Ordered   12/03/20 1903  Full code  Continuous        12/03/20 1902  Code Status History     Date Active Date Inactive Code Status Order ID Comments User Context   10/28/2020 0757 10/29/2020 1943 Full Code [id number]  Carollee Herter, DO ED   10/28/2020 0708 10/28/2020 0757 Full Code [id number]  Carollee Herter, DO ED   08/26/2014 0133 08/27/2014 1615 Full Code [id number]  Enedina Finner, MD Inpatient   08/16/2014 1808 08/21/2014 1356 Full Code [id number]  Shaune Pollack, MD Inpatient      Family Communication: Declined Disposition Plan: Status is: Observation  Dispo: The patient is from: Home              Anticipated d/c is to: Home once troponins trend down              Patient currently having rising troponins will need to be watched at least another day   Difficult to place patient.   No.  Consultants: Cardiology  Time spent: 28 minutes, case discussed with cardiology  [name]  [facility]

## 2020-12-04 NOTE — ED Notes (Signed)
Missing med requested from pharmacy

## 2020-12-04 NOTE — Discharge Summary (Signed)
[facility] - [place] [facility]   PATIENT NAME: [name]    MR#:  [id number]  DATE OF BIRTH:  09/15/1985  DATE OF ADMISSION:  12/03/2020 ADMITTING PHYSICIAN: Alford Highland, MD  DATE OF Signing out against medical advise: 12/04/2020  PRIMARY CARE PHYSICIAN: Olena Leatherwood, FNP    ADMISSION DIAGNOSIS:  Chest pain [R07.9] Elevated troponin [R77.8]  DISCHARGE DIAGNOSIS:  Principal Problem:   Chest pain Active Problems:   Hx pulmonary embolism   Anxiety, generalized   Current tobacco use   Type 2 diabetes mellitus with hyperlipidemia (HCC)   GERD (gastroesophageal reflux disease)   Elevated troponin   SECONDARY DIAGNOSIS:   Past Medical History:  Diagnosis Date  . Collagen vascular disease (HCC)    HX: RT blood clot in leg, PE  . Diabetes mellitus without complication (HCC)   . DVT (deep venous thrombosis) (HCC)    RLE; Sept 2015  . Pulmonary embolism [facility])    May 2016    HOSPITAL COURSE:   Chest pain with rising troponin.  Case discussed with Dr. Evette Georges cardiology.  With troponin rising from 910 to 1211 to 2724 to 3679, I am unable to discharge this patient.  The good thing is the patient had a negative cardiac cath last month.  Dr. Evette Georges recommended Lovenox injections for 48 hours and then back to Eliquis.  The patient signed out AGAINST MEDICAL ADVICE.  I did prescribe 3 doses of Lovenox injections upon leaving the hospital.  She can go back on Eliquis after those injections.  Continue Plavix.  Follow-up with cardiology as outpatient.  Follow-up PMD as outpatient.  I did give 1 dose of Solu-Medrol just in case this was costochondritis.  Dr. Evette Georges did not believe that this was a pericarditis. Hypercoagulable state with numerous pulmonary embolisms.  Last month had a CT scan of the chest that was negative for pulmonary embolism. COVID-positive.  Patient not having any symptoms at this time.  She declined treatment for COVID at this time.   Continue to isolate for 10 days. Tobacco abuse Type 2 diabetes mellitus with hyperlipidemia on atorvastatin.  Can go back on metformin as outpatient.  DISCHARGE CONDITIONS:  Signed out AMA with a rising troponin  CONSULTS OBTAINED:  Treatment Team:  Marcina Millard, MD  DRUG ALLERGIES:   Allergies  Allergen Reactions  . Penicillins Other (See Comments)    Reaction:  Unknown  . Xarelto [Rivaroxaban] Other (See Comments)    Reaction: Pt said it "made me feel like I was drunk."  . Ibuprofen Rash    DISCHARGE MEDICATIONS:   Allergies as of 12/04/2020       Reactions   Penicillins Other (See Comments)   Reaction:  Unknown   Xarelto [rivaroxaban] Other (See Comments)   Reaction: Pt said it "made me feel like I was drunk."   Ibuprofen Rash        Medication List     STOP taking these medications    apixaban 5 MG Tabs tablet Commonly known as: ELIQUIS   omeprazole 40 MG capsule Commonly known as: PRILOSEC       TAKE these medications    atorvastatin 80 MG tablet Commonly known as: LIPITOR Take 1 tablet (80 mg total) by mouth daily.   clopidogrel 75 MG tablet Commonly known as: Plavix Take 1 tablet (75 mg total) by mouth daily.   enoxaparin 100 MG/ML injection Commonly known as: LOVENOX Inject 1 mL (100 mg total) into the skin every  12 (twelve) hours for 3 doses.   metFORMIN 500 MG tablet Commonly known as: GLUCOPHAGE Take 500 mg by mouth 2 (two) times daily with a meal.         DISCHARGE INSTRUCTIONS:  Follow-up PMD 5 days Follow-up [facility] clinic cardiology 2 weeks  If you experience worsening of your admission symptoms, develop shortness of breath, life threatening emergency, suicidal or homicidal thoughts you must seek medical attention immediately by calling 911 or calling your MD immediately  if symptoms less severe.  You Must read complete instructions/literature along with all the possible adverse reactions/side effects for all the  Medicines you take and that have been prescribed to you. Take any new Medicines after you have completely understood and accept all the possible adverse reactions/side effects.   Please note  You were cared for by a hospitalist during your hospital stay. If you have any questions about your discharge medications or the care you received while you were in the hospital after you are discharged, you can call the unit and asked to speak with the hospitalist on call if the hospitalist that took care of you is not available. Once you are discharged, your primary care physician will handle any further medical issues. Please note that NO REFILLS for any discharge medications will be authorized once you are discharged, as it is imperative that you return to your primary care physician (or establish a relationship with a primary care physician if you do not have one) for your aftercare needs so that they can reassess your need for medications and monitor your lab values.    Today   CHIEF COMPLAINT:   Chief Complaint  Patient presents with  . Chest Pain    HISTORY OF PRESENT ILLNESS:  [name]  is a 35 y.o. female came with chest pain and found to have a rising troponin   VITAL SIGNS:  Blood pressure 113/70, pulse 84, temperature 98 F (36.7 C), temperature source Oral, resp. rate 19, height 5\' 5"  (1.651 m), weight 97.4 kg, SpO2 100 %.  DATA REVIEW:   CBC Recent Labs  Lab 12/04/20 0735  WBC 9.2  HGB 14.4  HCT 43.0  PLT 176    Chemistries  Recent Labs  Lab 12/04/20 0925  NA 134*  K 4.1  CL 100  CO2 26  GLUCOSE 213*  BUN 12  CREATININE 0.50  CALCIUM 8.7*  MG 2.0  AST 21  ALT 15  ALKPHOS 57  BILITOT 0.8     Microbiology Results  Results for orders placed or performed during the hospital encounter of 12/03/20  Resp Panel by RT-PCR (Flu A&B, Covid) Nasopharyngeal Swab     Status: Abnormal   Collection Time: 12/03/20  8:33 PM   Specimen: Nasopharyngeal Swab;  Nasopharyngeal(NP) swabs in vial transport medium  Result Value Ref Range Status   SARS Coronavirus 2 by RT PCR POSITIVE (A) NEGATIVE Final    Comment: RESULT CALLED TO, READ BACK BY AND VERIFIED WITH: 12/05/20 NORRIS @ 2134 12/03/20 LFD (NOTE) SARS-CoV-2 target nucleic acids are DETECTED.  The SARS-CoV-2 RNA is generally detectable in upper respiratory specimens during the acute phase of infection. Positive results are indicative of the presence of the identified virus, but do not rule out bacterial infection or co-infection with other pathogens not detected by the test. Clinical correlation with patient history and other diagnostic information is necessary to determine patient infection status. The expected result is Negative.  Fact Sheet for Patients: 12/05/20  Fact Sheet  for Healthcare Providers: SeriousBroker.it  This test is not yet approved or cleared by the [place] and  has been authorized for detection and/or diagnosis of SARS-CoV-2 by FDA under an Emergency Use Authorization (EUA).  This EUA will remain in effect (meaning this test can b e used) for the duration of  the COVID-19 declaration under Section 564(b)(1) of the Act, 21 U.S.C. section 360bbb-3(b)(1), unless the authorization is terminated or revoked sooner.     Influenza A by PCR NEGATIVE NEGATIVE Final   Influenza B by PCR NEGATIVE NEGATIVE Final    Comment: (NOTE) The Xpert Xpress SARS-CoV-2/FLU/RSV plus assay is intended as an aid in the diagnosis of influenza from Nasopharyngeal swab specimens and should not be used as a sole basis for treatment. Nasal washings and aspirates are unacceptable for Xpert Xpress SARS-CoV-2/FLU/RSV testing.  Fact Sheet for Patients: BloggerCourse.com  Fact Sheet for Healthcare Providers: SeriousBroker.it  This test is not yet approved or cleared by the  [place] FDA and has been authorized for detection and/or diagnosis of SARS-CoV-2 by FDA under an Emergency Use Authorization (EUA). This EUA will remain in effect (meaning this test can be used) for the duration of the COVID-19 declaration under Section 564(b)(1) of the Act, 21 U.S.C. section 360bbb-3(b)(1), unless the authorization is terminated or revoked.  Performed at [facility], [address] [place], [place] [address]     RADIOLOGY:  DG Chest 2 View  Result Date: 12/03/2020 CLINICAL DATA:  Chest pain. EXAM: CHEST - 2 VIEW COMPARISON:  October 27, 2020 FINDINGS: Opacity in the lateral right lung base is stable since October 27, 2020. A CT scan on October 28, 2020 demonstrated scarring or atelectasis in this region. The heart, hila, mediastinum, lungs, and pleura are otherwise unchanged and unremarkable. IMPRESSION: No acute abnormalities or changes. Electronically Signed   By: Gerome Sam III M.D.   On: 12/03/2020 16:32      Management plans discussed with patient and she decided to sign out AGAINST MEDICAL ADVICE.  CODE STATUS:     Code Status Orders  (From admission, onward)           Start     Ordered   12/03/20 1903  Full code  Continuous        12/03/20 1902           Code Status History     Date Active Date Inactive Code Status Order ID Comments User Context   10/28/2020 0757 10/29/2020 1943 Full Code [id number]  Carollee Herter, DO ED   10/28/2020 0708 10/28/2020 0757 Full Code [id number]  Carollee Herter, DO ED   08/26/2014 0133 08/27/2014 1615 Full Code [id number]  Enedina Finner, MD Inpatient   08/16/2014 1808 08/21/2014 1356 Full Code [id number]  Shaune Pollack, MD Inpatient       TOTAL TIME TAKING CARE OF THIS PATIENT: 35 minutes.    Alford Highland M.D on 12/04/2020 at 4:16 PM   [facility]  CC: Primary care physician; Olena Leatherwood, FNP

## 2020-12-04 NOTE — Discharge Instructions (Signed)
Take lovenox injection tonight, tomorrow am and tomorrow evening.  Can back on eliquis Sept 20th.

## 2020-12-05 LAB — MYOGLOBIN, SERUM: Myoglobin: <21 ng/mL — ABNORMAL LOW (ref 25–58)

## 2021-10-25 IMAGING — CT CT ANGIO CHEST
2 of 6 series · 17 of 46 positions shown · IV contrast (APPLIED)
Comparison: Chest radiographs today, 12/24/2009. CTA chest
08/25/2014. CT Abdomen and Pelvis 01/10/2014.

CLINICAL DATA: 34-year-old female with recent shortness of breath.
Small pleural effusions suspected on the right on recent
radiographs. Cough and right rib pain.

History of significant PE in 2679.
EXAM:
CT ANGIOGRAPHY CHEST
CT ABDOMEN AND PELVIS WITH CONTRAST
TECHNIQUE: Multidetector CT imaging of the chest was performed using the
standard protocol during bolus administration of intravenous
contrast. Multiplanar CT image reconstructions and MIPs were
obtained to evaluate the vascular anatomy. Multidetector CT imaging
of the abdomen and pelvis was performed using the standard protocol
during bolus administration of intravenous contrast.
CONTRAST:  100mL OMNIPAQUE IOHEXOL 350 MG/ML SOLN

[Series 3: thins · axial · 0.68mm/px · z∈[-568,-325]mm · 14 of 267 slices shown]
[im 12/267  lung]
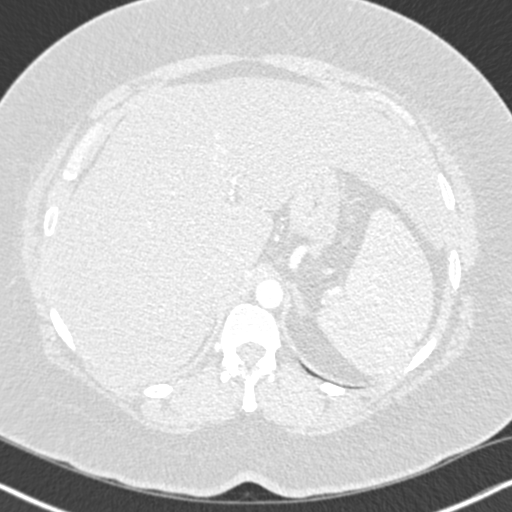
[im 35/267  soft-tissue]
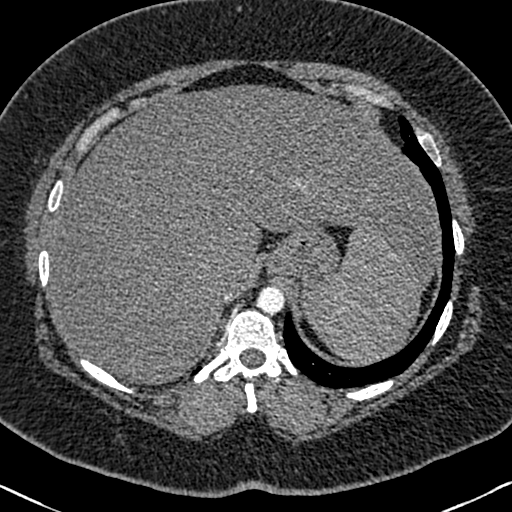
[im 47/267  lung]
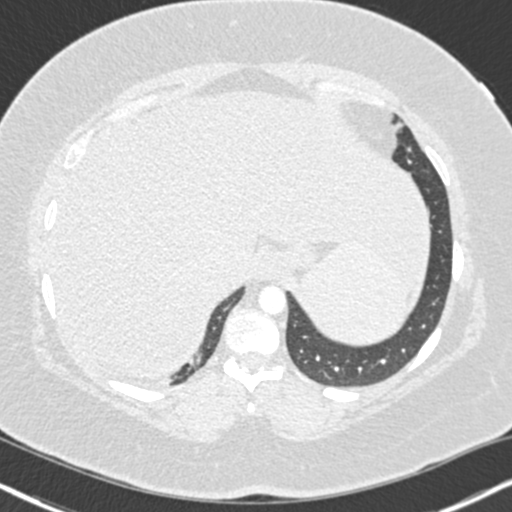
[im 70/267  soft-tissue]
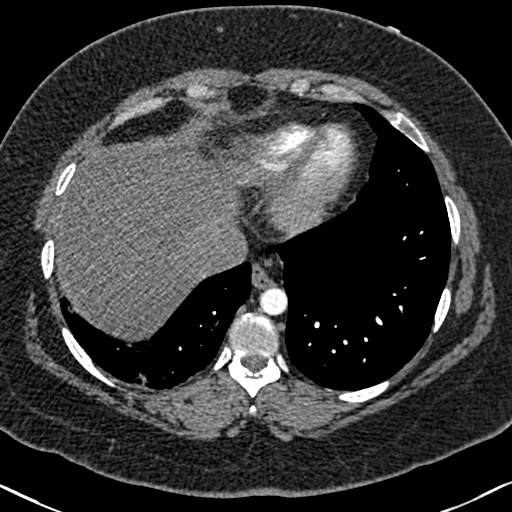
[im 93/267  lung]
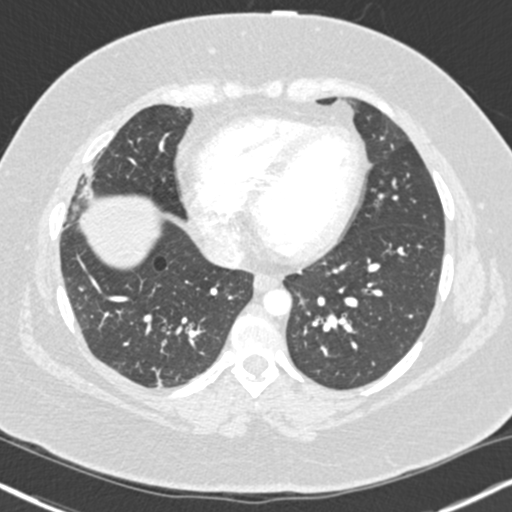
[im 105/267  soft-tissue]
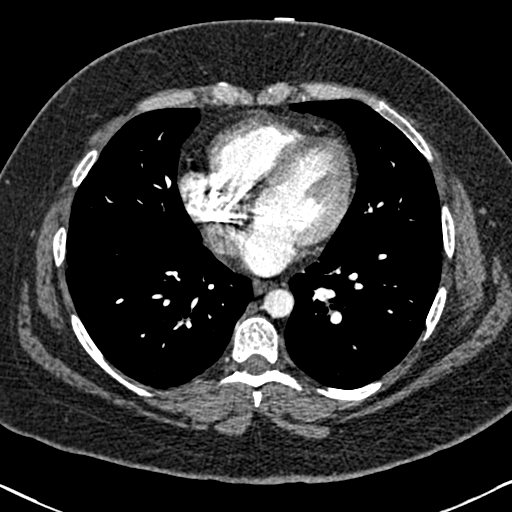
[im 128/267  lung]
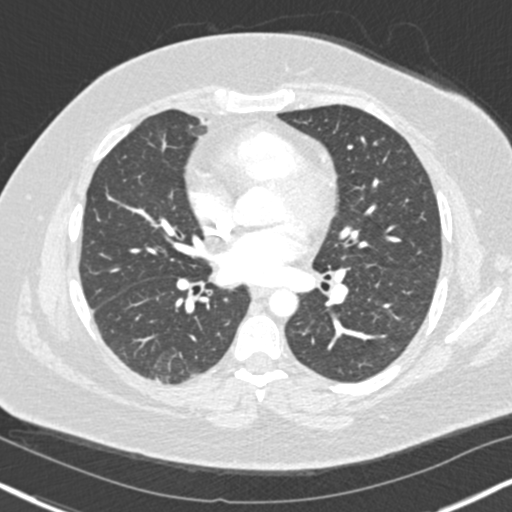
[im 139/267  soft-tissue]
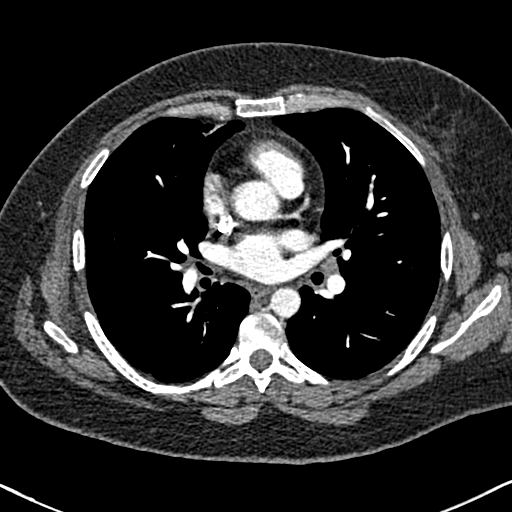
[im 162/267  lung]
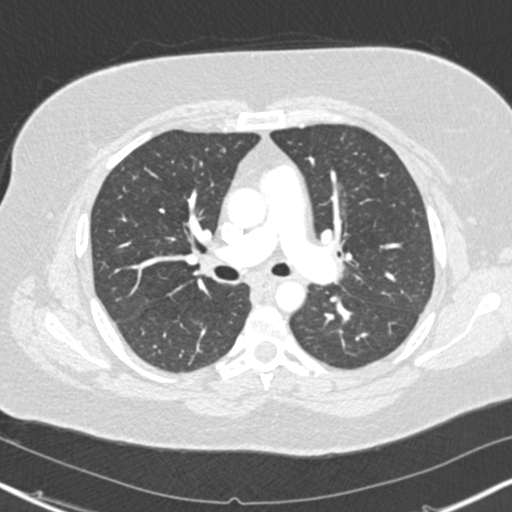
[im 174/267  soft-tissue]
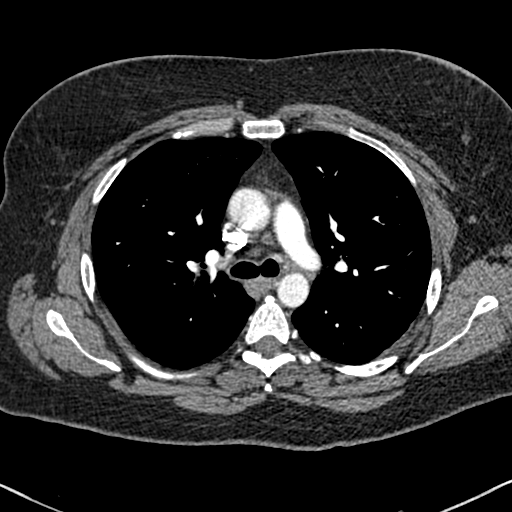
[im 197/267  lung]
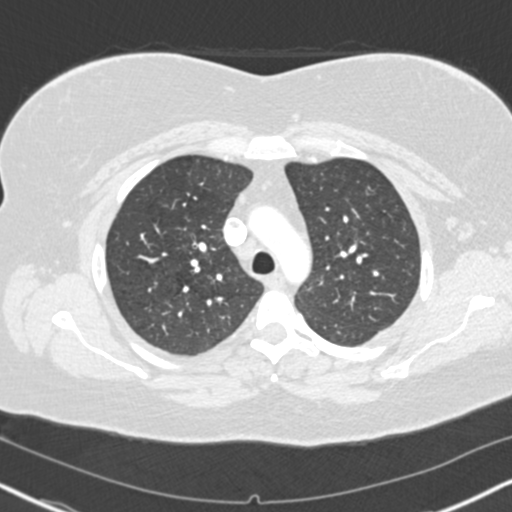
[im 220/267  soft-tissue]
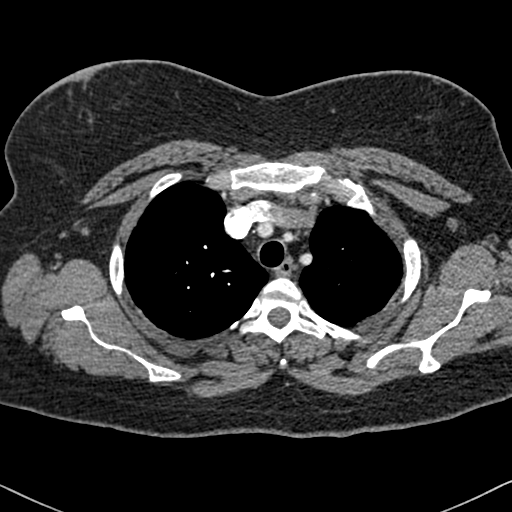
[im 232/267  lung]
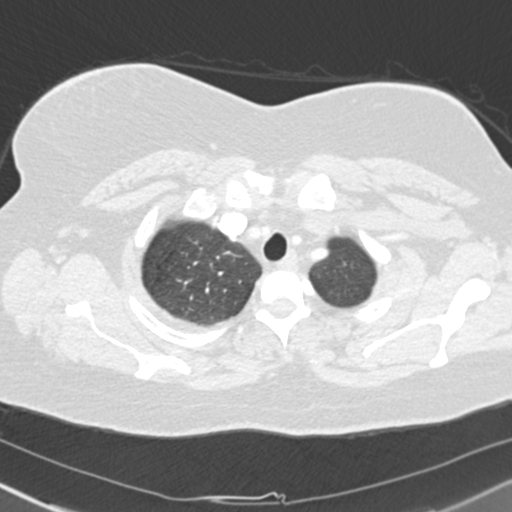
[im 255/267  soft-tissue]
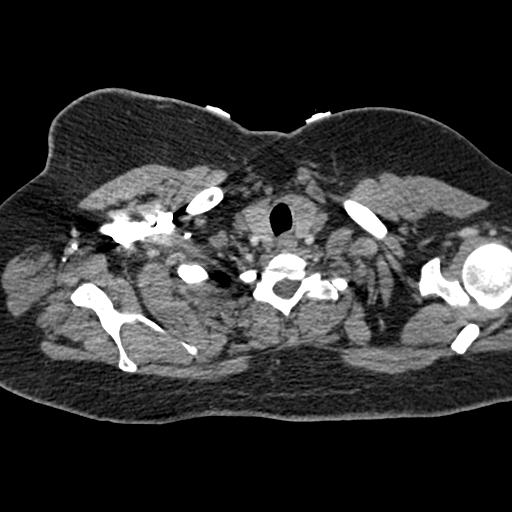

[Series 5: coronal mpr · coronal · 0.63mm/px · 3 of 176 slices shown]
[im 44/176  soft-tissue]
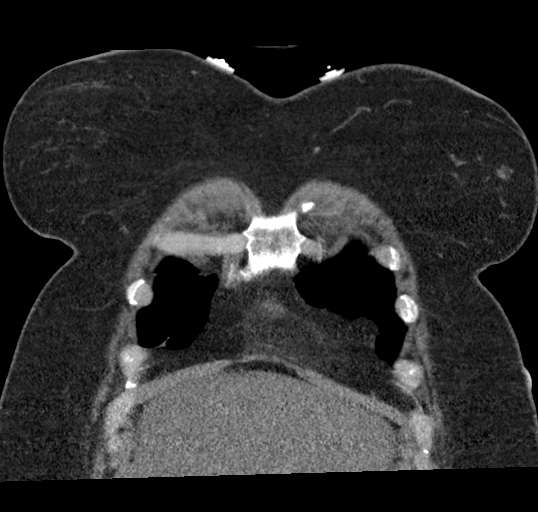
[im 88/176  soft-tissue]
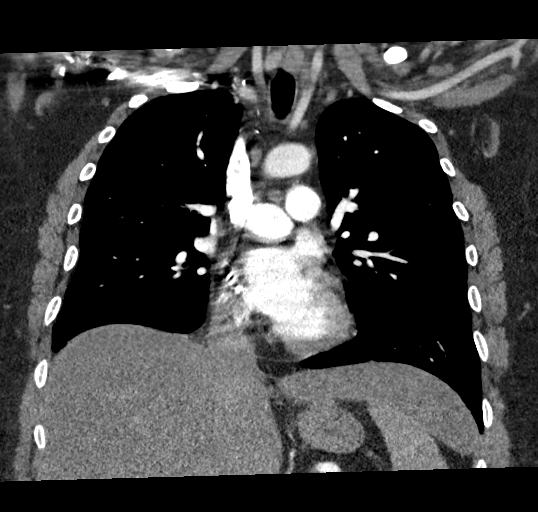
[im 132/176  soft-tissue]
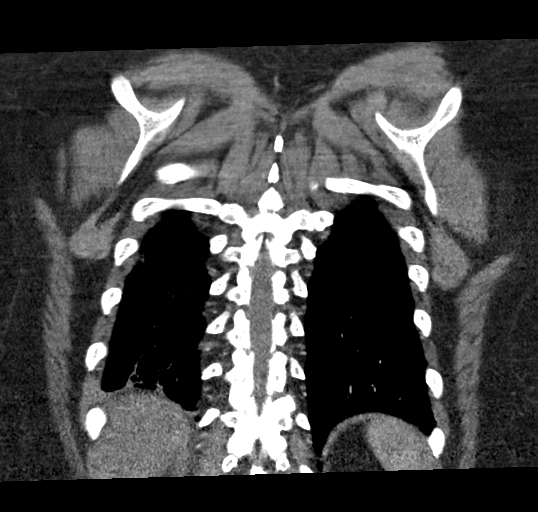

[17 of 46 positions shown; findings below may reference images not displayed]

FINDINGS: CTA CHEST FINDINGS

Cardiovascular: Satisfactory contrast bolus timing in the pulmonary
arterial tree. There is chronic calcification in the right main
pulmonary artery which is probably sequelae of saddle embolus seen
in 2679 (series 7, image 107).

No focal filling defect identified in the pulmonary arteries to
suggest acute pulmonary embolism.

No calcified coronary artery atherosclerosis is evident. No
cardiomegaly or pericardial effusion. Negative visible aorta.

Mediastinum/Nodes: 8-9 mm short axis mediastinal lymph nodes, most
notable in the precarinal station, do not appear significantly
changed from 2679.

Lungs/Pleura: Major airways are patent. No pleural effusion, but
there is multifocal scarring along the right lung base and right
costophrenic angle, probably the sequelae of remote pulmonary
infarction.

As in 2679, the left lung is clear. No pulmonary opacity is
suspicious for acute infection or inflammation.

Musculoskeletal: Negative.

Review of the MIP images confirms the above findings.

CT ABDOMEN and PELVIS FINDINGS

Hepatobiliary: Hepatic steatosis, increased since 6145. Negative
gallbladder. No bile duct enlargement.

Pancreas: Negative.

Spleen: Splenic size is at the upper limits of normal but not
significantly changed from 6145.

Adrenals/Urinary Tract: Negative adrenal glands. Bilateral renal
enhancement is symmetric and within normal limits. Right side legs
of the IVC filter abut the right renal hilum (coronal image 64) but
there is no pararenal stranding. Both ureters appear decompressed.
Bilateral pelvic phleboliths are noted. No convincing urinary
calculus. Unremarkable urinary bladder.

Stomach/Bowel: There is a small area of mesenteric calcification,
likely postinflammatory (series 4, image 68) and in an area of mild
inflammatory stranding on the 6145 comparison.

Redundant but otherwise negative sigmoid colon and rectum. Retained
stool in the descending and transverse colon. Mildly redundant
transverse. Negative right colon, with normal appendix visible on
series 7, image 64. Negative terminal ileum.

No dilated small bowel. No bowel inflammation. Decompressed stomach.
No free air, free fluid.

Vascular/Lymphatic: IVC filter in place with filter tip and some
legs extending out beyond the confines of the cava (coronal image
64). No associated acute abnormality.

Major arterial structures are patent. Portal venous system is
patent. No lymphadenopathy.

Reproductive: Surgically absent uterus. Ovaries are within normal
limits for age.

Other: No pelvic free fluid.

Musculoskeletal: Stable, negative.

Review of the MIP images confirms the above findings.
IMPRESSION: 1. Negative for acute pulmonary embolus. Chronic calcification in
the right main pulmonary artery is likely the sequelae of the 2679
saddle embolus.

2. Post-infarct related right lung base scarring. No pleural
effusion or acute pulmonary process identified.

3. Incidentally noted migration of some IVC filter components
through the wall of the cava. In general this appearance does not
require any intervention, and there is no associated acute
abnormality.

4. Hepatic steatosis.

## 2021-10-25 IMAGING — US US ABDOMEN LIMITED
1 series · 14 of 25 positions shown · non-contrast
Comparison: None.

CLINICAL DATA: Right upper quadrant pain for several days

EXAM:
ULTRASOUND ABDOMEN LIMITED RIGHT UPPER QUADRANT

[Series 1: us abdomen limited ruq · 14 of 45 slices shown]
[im 1/45]
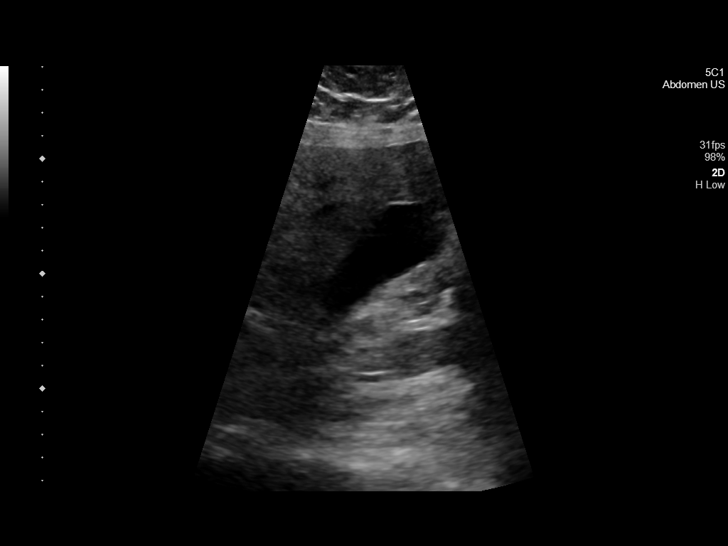
[im 4/45]
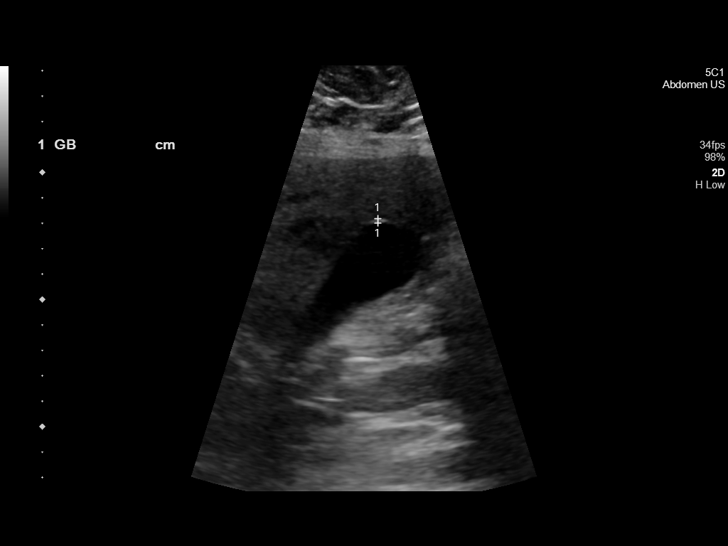
[im 8/45]
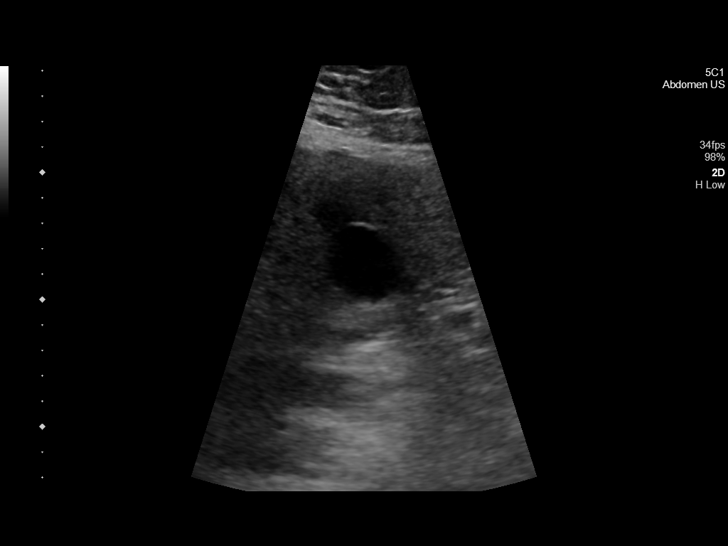
[im 12/45]
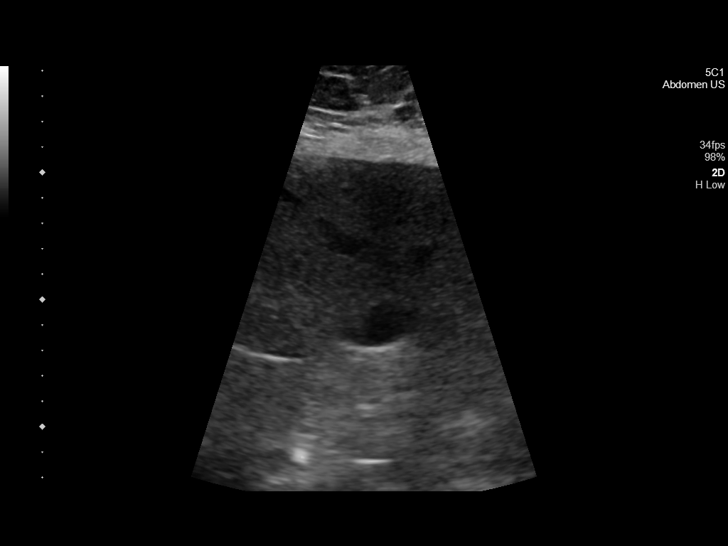
[im 15/45]
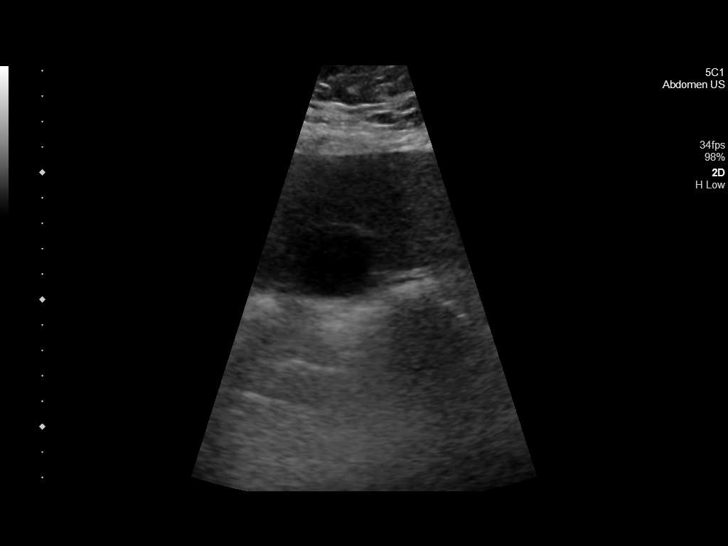
[im 17/45]
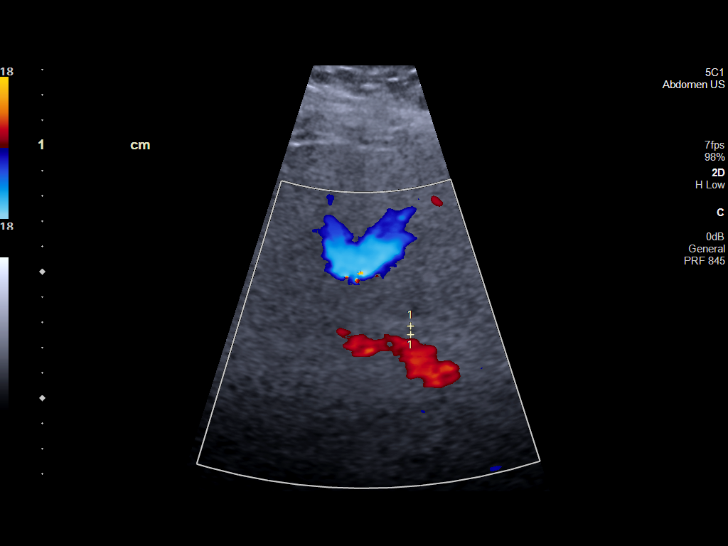
[im 21/45]
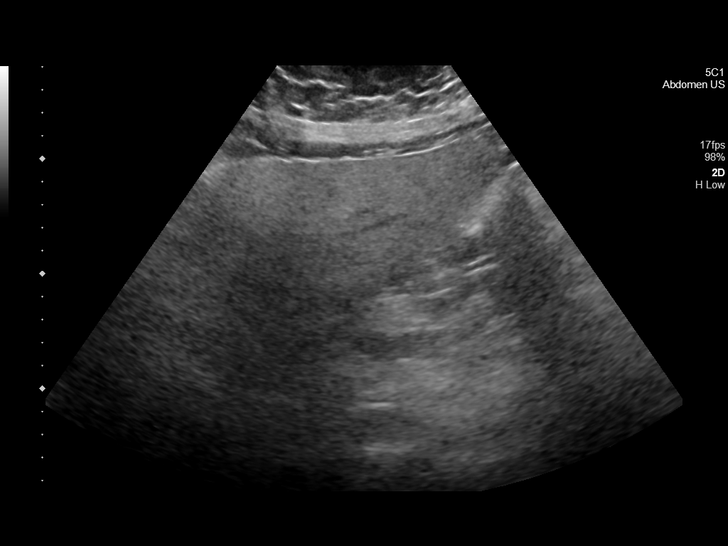
[im 24/45]
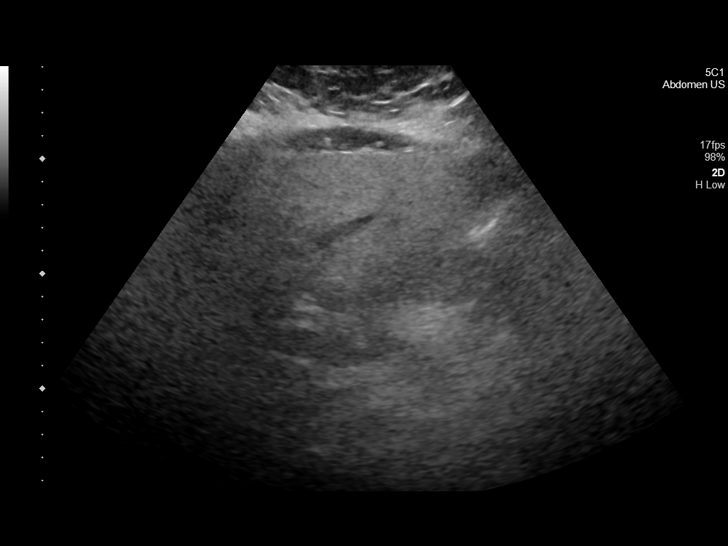
[im 28/45]
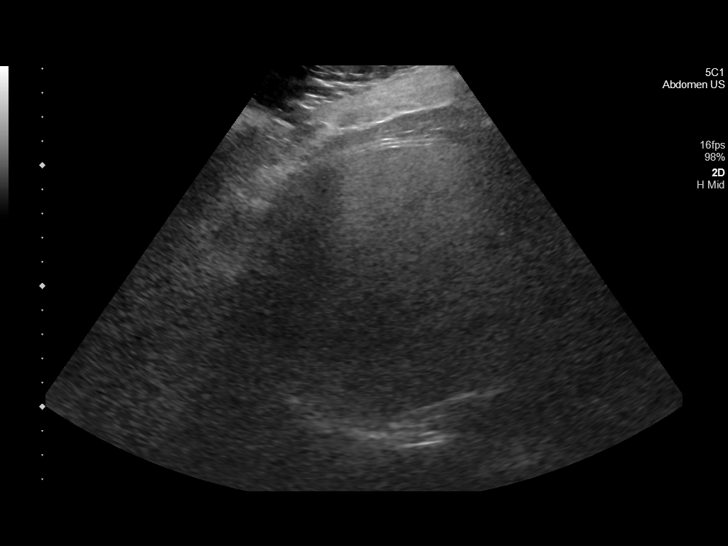
[im 30/45]
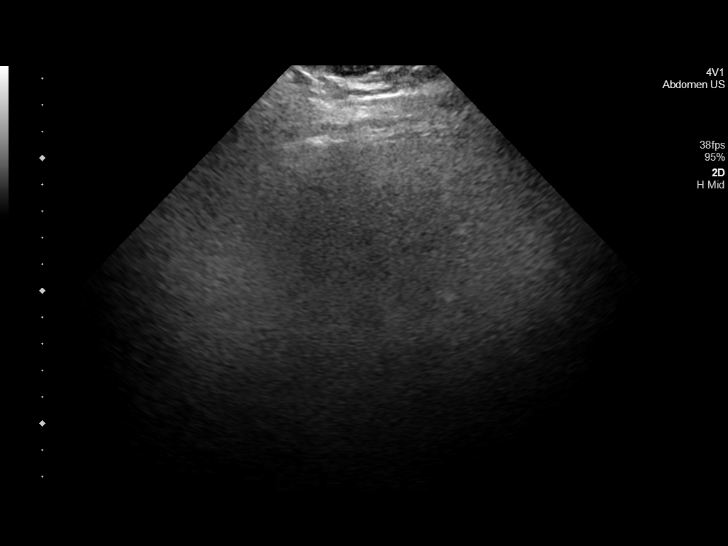
[im 34/45]
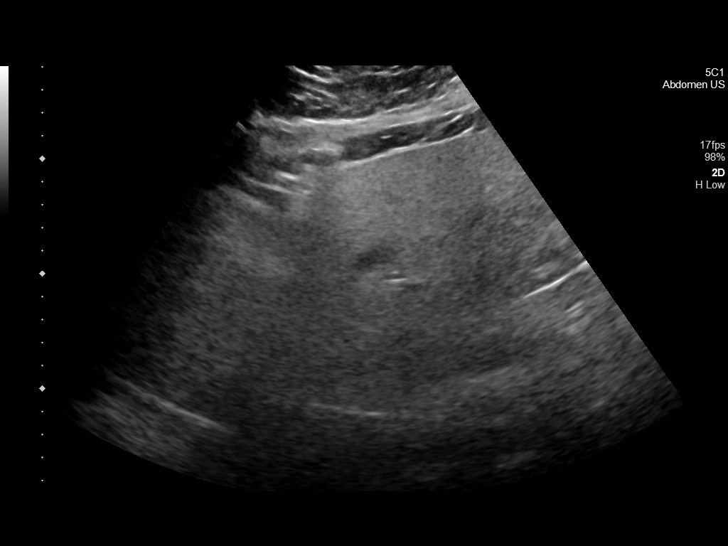
[im 37/45]
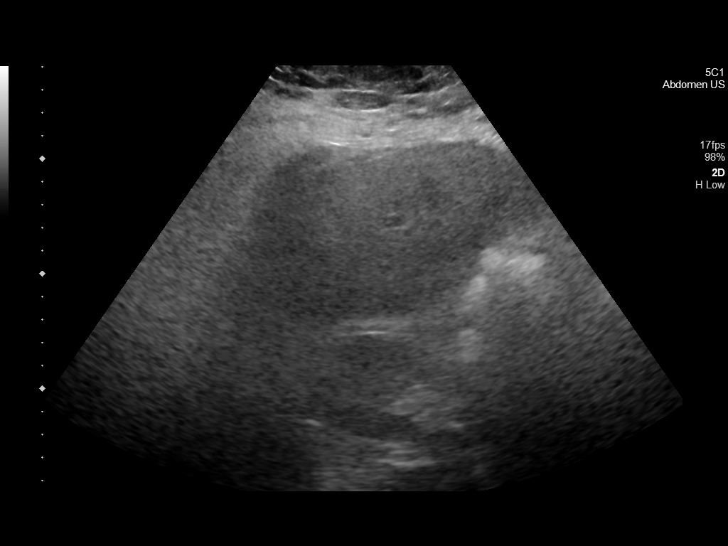
[im 41/45]
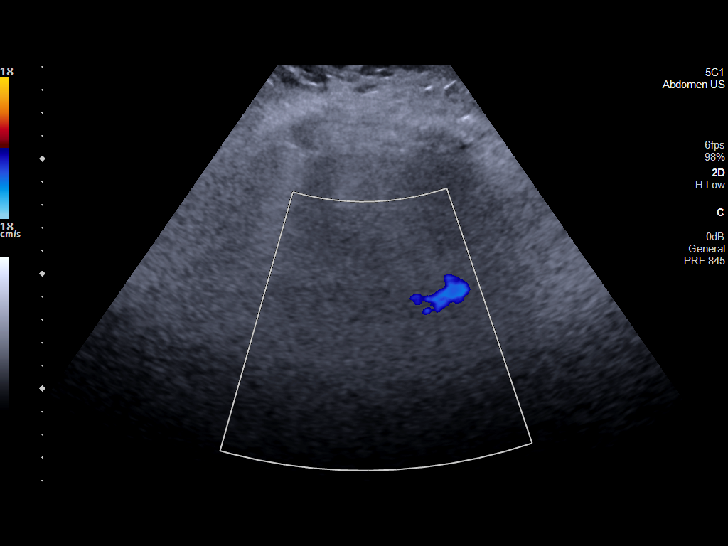
[im 45/45]
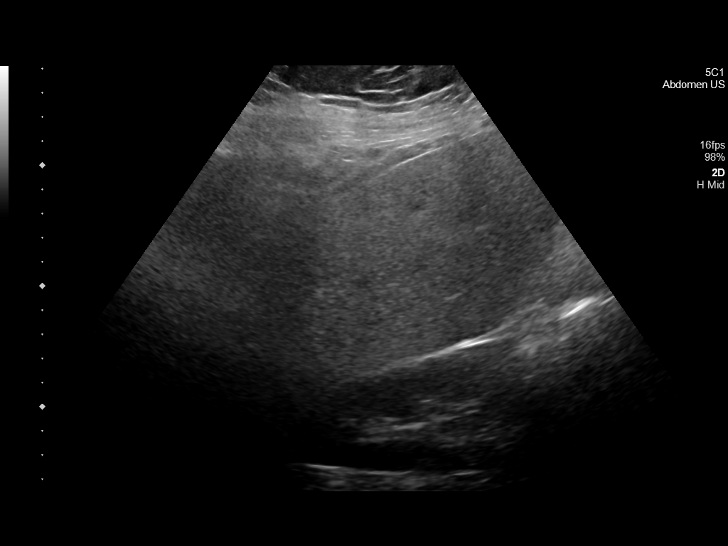

[14 of 25 positions shown; findings below may reference images not displayed]

FINDINGS: Gallbladder:

No gallstones or wall thickening visualized. No sonographic Murphy
sign noted by sonographer.

Common bile duct:

Diameter: 3.4 mm.

Liver:

Diffusely increased in echogenicity consistent with fatty
infiltration. No focal mass is noted. Portal vein is patent on color
Doppler imaging with normal direction of blood flow towards the
liver.

Other: None.
IMPRESSION: Fatty liver.

## 2023-12-04 ENCOUNTER — Telehealth
